# Patient Record
Sex: Female | Born: 1935 | Race: White | Hispanic: No | Smoking: Current every day smoker
Health system: Southern US, Community
[De-identification: ages and names within clinical notes are randomized; demographics above are authoritative.]

## PROBLEM LIST (undated history)

## (undated) DIAGNOSIS — F0280 Dementia in other diseases classified elsewhere without behavioral disturbance: Secondary | ICD-10-CM

## (undated) DIAGNOSIS — N189 Chronic kidney disease, unspecified: Secondary | ICD-10-CM

## (undated) DIAGNOSIS — E785 Hyperlipidemia, unspecified: Secondary | ICD-10-CM

## (undated) DIAGNOSIS — I10 Essential (primary) hypertension: Secondary | ICD-10-CM

## (undated) DIAGNOSIS — G309 Alzheimer's disease, unspecified: Secondary | ICD-10-CM

## (undated) DIAGNOSIS — E119 Type 2 diabetes mellitus without complications: Secondary | ICD-10-CM

## (undated) HISTORY — DX: Essential (primary) hypertension: I10

## (undated) HISTORY — DX: Hyperlipidemia, unspecified: E78.5

## (undated) HISTORY — DX: Dementia in other diseases classified elsewhere, unspecified severity, without behavioral disturbance, psychotic disturbance, mood disturbance, and anxiety: F02.80

## (undated) HISTORY — DX: Type 2 diabetes mellitus without complications: E11.9

## (undated) HISTORY — DX: Alzheimer's disease, unspecified: G30.9

## (undated) HISTORY — DX: Chronic kidney disease, unspecified: N18.9

---

## 2013-05-31 ENCOUNTER — Inpatient Hospital Stay: Payer: Self-pay | Admitting: Internal Medicine

## 2013-05-31 LAB — URINALYSIS, COMPLETE
Bilirubin,UR: NEGATIVE
Blood: NEGATIVE
Glucose,UR: NEGATIVE mg/dL (ref 0–75)
Hyaline Cast: 2
Ph: 5 (ref 4.5–8.0)
Protein: NEGATIVE
RBC,UR: 3 /HPF (ref 0–5)

## 2013-05-31 LAB — CBC
HCT: 41.5 % (ref 35.0–47.0)
HGB: 14.3 g/dL (ref 12.0–16.0)
MCH: 32.3 pg (ref 26.0–34.0)
MCHC: 34.5 g/dL (ref 32.0–36.0)
MCV: 94 fL (ref 80–100)
Platelet: 269 10*3/uL (ref 150–440)
WBC: 9.3 10*3/uL (ref 3.6–11.0)

## 2013-05-31 LAB — COMPREHENSIVE METABOLIC PANEL
Albumin: 4.1 g/dL (ref 3.4–5.0)
Alkaline Phosphatase: 128 U/L (ref 50–136)
BUN: 19 mg/dL — ABNORMAL HIGH (ref 7–18)
Chloride: 101 mmol/L (ref 98–107)
Co2: 26 mmol/L (ref 21–32)
Creatinine: 1.2 mg/dL (ref 0.60–1.30)
EGFR (African American): 49 — ABNORMAL LOW
EGFR (Non-African Amer.): 42 — ABNORMAL LOW
Glucose: 117 mg/dL — ABNORMAL HIGH (ref 65–99)
Osmolality: 272 (ref 275–301)
Potassium: 4 mmol/L (ref 3.5–5.1)
SGOT(AST): 16 U/L (ref 15–37)
SGPT (ALT): 16 U/L (ref 12–78)

## 2013-05-31 LAB — DRUG SCREEN, URINE
Amphetamines, Ur Screen: NEGATIVE (ref ?–1000)
Barbiturates, Ur Screen: NEGATIVE (ref ?–200)
MDMA (Ecstasy)Ur Screen: NEGATIVE (ref ?–500)
Phencyclidine (PCP) Ur S: NEGATIVE (ref ?–25)

## 2013-05-31 LAB — ETHANOL: Ethanol %: <0.003 % (ref 0.000–0.080)

## 2013-06-01 LAB — TSH: Thyroid Stimulating Horm: 2.36 u[IU]/mL

## 2013-06-01 LAB — CBC WITH DIFFERENTIAL/PLATELET
Basophil #: 0.1 10*3/uL (ref 0.0–0.1)
Eosinophil %: 3 %
Lymphocyte %: 28.2 %
MCHC: 34 g/dL (ref 32.0–36.0)
MCV: 94 fL (ref 80–100)
Neutrophil #: 5.6 10*3/uL (ref 1.4–6.5)
Neutrophil %: 59.9 %
Platelet: 255 10*3/uL (ref 150–440)
RBC: 4.59 10*6/uL (ref 3.80–5.20)
RDW: 12.5 % (ref 11.5–14.5)
WBC: 9.4 10*3/uL (ref 3.6–11.0)

## 2013-06-01 LAB — BASIC METABOLIC PANEL
BUN: 19 mg/dL — ABNORMAL HIGH (ref 7–18)
Calcium, Total: 9.1 mg/dL (ref 8.5–10.1)
Chloride: 101 mmol/L (ref 98–107)
Co2: 27 mmol/L (ref 21–32)
Glucose: 126 mg/dL — ABNORMAL HIGH (ref 65–99)
Osmolality: 274 (ref 275–301)
Sodium: 135 mmol/L — ABNORMAL LOW (ref 136–145)

## 2013-06-01 LAB — LIPID PANEL
Ldl Cholesterol, Calc: 128 mg/dL — ABNORMAL HIGH (ref 0–100)
Triglycerides: 167 mg/dL (ref 0–200)
VLDL Cholesterol, Calc: 33 mg/dL (ref 5–40)

## 2013-06-01 LAB — MAGNESIUM: Magnesium: 2.1 mg/dL

## 2013-06-02 LAB — URINE CULTURE

## 2014-02-02 ENCOUNTER — Emergency Department: Payer: Self-pay | Admitting: Emergency Medicine

## 2014-02-02 LAB — BASIC METABOLIC PANEL
ANION GAP: 7 (ref 7–16)
BUN: 31 mg/dL — ABNORMAL HIGH (ref 7–18)
CALCIUM: 9.1 mg/dL (ref 8.5–10.1)
CHLORIDE: 101 mmol/L (ref 98–107)
CREATININE: 1.45 mg/dL — AB (ref 0.60–1.30)
Co2: 25 mmol/L (ref 21–32)
GFR CALC AF AMER: 39 — AB
GFR CALC NON AF AMER: 33 — AB
Glucose: 225 mg/dL — ABNORMAL HIGH (ref 65–99)
OSMOLALITY: 280 (ref 275–301)
Potassium: 4.9 mmol/L (ref 3.5–5.1)
Sodium: 133 mmol/L — ABNORMAL LOW (ref 136–145)

## 2014-02-02 LAB — CBC
HCT: 37.7 % (ref 35.0–47.0)
HGB: 12.8 g/dL (ref 12.0–16.0)
MCH: 32.6 pg (ref 26.0–34.0)
MCHC: 33.9 g/dL (ref 32.0–36.0)
MCV: 96 fL (ref 80–100)
Platelet: 242 10*3/uL (ref 150–440)
RBC: 3.93 10*6/uL (ref 3.80–5.20)
RDW: 12.5 % (ref 11.5–14.5)
WBC: 10.7 10*3/uL (ref 3.6–11.0)

## 2015-01-18 NOTE — H&P (Signed)
PATIENT NAME:  Dana Riddle, Dana Riddle MR#:  [id number] DATE OF BIRTH:  [birth year]  DATE OF ADMISSION:  05/31/2013   PRIMARY CARE PHYSICIAN: Dr. Vonita MossMark Crissman.   REFERRING EMERGENCY ROOM PHYSICIAN: Dr. Jene Everyobert Kinner.   CHIEF COMPLAINT: Confusion.   HISTORY OF PRESENT ILLNESS: An 81 year old female who lives alone and very independent and active and healthy according to her age. She has her front upper tooth broken for the last 3 to 4 months and has been following with dentist. Today, she was trying to go to her dentist office, but instead of going to dentist office she ended up going to [facility] 6 times repeatedly. In spite of being told, "No, this is a laboratory," she repeatedly went over there to see her dentist, so finally police were called in and police brought her to Emergency Room because of confusion. Emergency physician put her on involuntary commitment because of her inability to understand the place. On my examination and history taking, the patient looks totally alert and surprisingly she looks very sharp about all the histories and answers she gave to me. She denies any complaint of urinary frequency or urgency, and she is surprised that she went 6 times to laboratory she does not know about that, but she is worried about her dog who is alone at home and somebody needs to feed it.  Denies any complaint of fever, cough or shortness of breath.   REVIEW OF SYSTEMS:  CONSTITUTIONAL: Negative for fever, fatigue, weakness, pain or weight loss.  EYES: No blurring, double vision, redness.  EARS, NOSE, THROAT: No tinnitus, ear pain, hearing loss or discharge.    RESPIRATORY: No cough, wheezing, hemoptysis or asthma.  CARDIOVASCULAR: No chest pain, orthopnea, edema, arrhythmia or palpitations.  GASTROINTESTINAL: No nausea, vomiting, diarrhea, abdominal pain.  GENITOURINARY: No dysuria, hematuria or increased frequency.  ENDOCRINE: No increased sweating. No heat or cold intolerance.  SKIN: No acne or  rashes.  MUSCULOSKELETAL: No pain or swelling in the joints.  NEUROLOGICAL: No numbness, weakness or epilepsy or tremors.  PSYCHIATRIC: No anxiety, insomnia or bipolar disorder.   PAST MEDICAL HISTORY: Diabetes.   PAST SURGICAL HISTORY: None.   ALLERGIES: No known drug allergy.   SOCIAL HISTORY: She lives alone, independent, has a dog. Smokes 3 to 4 cigarettes a day. Denies drinking alcohol or doing any drugs.   FAMILY HISTORY: Denies any coronary artery disease or cancer in the family.   HOME MEDICATION:  Glipizide for diabetes. She does not remember the dose.   PHYSICAL EXAMINATION: VITAL SIGNS: In ER, temperature 98.5, pulse 98, respirations 18, blood pressure 140/74, pulse ox 97 on room air.  GENERAL: The patient is alert and appears to be oriented but slightly confused and forgetful about the events, what happened today and why she came to the Emergency Room. She is cooperative with history taking and physical examination.  HEENT: Head and neck atraumatic. Conjunctivae pink. Oral mucosa moist.  NECK: Supple. No JVD.  RESPIRATORY: Bilateral clear and equal air entry.  CARDIOVASCULAR: S1, S2 present, regular. No murmur.  ABDOMEN: Soft, nontender. Bowel sounds present. No organomegaly.  SKIN: No rashes.  EXTREMITIES:  Legs: No edema. Joints: No swelling or tenderness.  NEUROLOGICAL: Power 5/5. Follows commands. No tremors.  PSYCHIATRIC: Does not appear in any acute psychiatric illness at this time.   LABORATORY RESULTS:  Glucose 117, BUN 19, creatinine 1.20, sodium 134, potassium 4.0, chloride 101, CO2 of 26.  Ethanol level less than 3. Total protein 8, albumin 4.1, bilirubin  0.5. TSH 1.42. Urine for toxicology is negative. Total white cell count 9.3, hemoglobin 14.3, platelet count 269 and MCV 94. Urinalysis is positive with 72 WBCs and 3+ leukocyte esterase.   ASSESSMENT AND PLAN: An 81 year old female with past medical history of diabetes, presented to the ER by police because of  confusion and repeatedly going to laboratory  instead of going to dentist office. She is currently on involuntary commitment due to her confusion.  1.  Altered mental status and confusion, possibly delirium secondary to urinary tract infection. We will treat underlying cause. She is currently on involuntary commitment. We will call psychiatry consult for further management of that.  2.  Urinary tract infection. We will send urine culture and blood culture, and we will treat with IV Rocephin.  3.  Diabetes. We will give her insulin with sliding scale coverage and monitor her blood sugar level.  4.  Current smoker. Smoking cessation counseling done for 5 minutes. She declines to use nicotine patch here in the hospital.   CODE STATUS: Full code.   TOTAL TIME SPENT ON THIS ADMISSION: 45 minutes    ____________________________ Hope Pigeon Elisabeth Pigeon, MD vgv:dmm D: 05/31/2013 18:53:31 ET T: 05/31/2013 19:05:48 ET JOB#: [id number]  cc: Hope Pigeon. Elisabeth Pigeon, MD, <Dictator> Altamese Dilling MD ELECTRONICALLY SIGNED 06/03/2013 15:41

## 2015-01-18 NOTE — Consult Note (Signed)
PATIENT NAME:  Dana Riddle, Jaidence E MR#:  [id number] DATE OF BIRTH:  1928/11/02  DATE OF CONSULTATION:  06/02/2013  REFERRING PHYSICIAN:      Ramonita LabAruna Gouru, M.D.   CONSULTING PHYSICIAN:  Farid Grigorian B. Maikel Neisler, MD  REASON FOR CONSULTATION: To evaluate a patient with altered mental status.   IDENTIFYING DATA:  Dana Riddle  is an 81 year old female with no past psychiatric history.   CHIEF COMPLAINT: "Thank you, thank you."   HISTORY OF PRESENT ILLNESS:  Dana Riddle has no psychiatric history. She was brought to the hospital for altered mental status in the course of urinary tract infection. Reportedly, on the day of admission, instead of going to her dentist, she repeatedly went to [facility]. Police were eventually called, the patient was brought to the Emergency Room and she was petitioned. She was admitted to the medical floor and treated for urinary tract infection with full resolution of mental status changes. The patient is a very pleasant, polite and thrilled to talk with psychiatrist and be released from the hospital. She adamantly denies any symptoms of depression, anxiety, psychosis. No symptoms suggestive of bipolar mania.  No substance use. She is living independently and fully able to manage her affairs.    PAST PSYCHIATRIC HISTORY: None. No hospitalizations or treatment. No substance abuse treatment.   FAMILY PSYCHIATRIC HISTORY: None.   MEDICAL HISTORY:  Diabetes, a UTI.    ALLERGIES: No known drug allergies.   MEDICATIONS ON ADMISSION: Glipizide.   SOCIAL HISTORY: As above, lives independently, very well adjusted. For the past several months, she has been following her with her dentist to fix her broken upper tooth,  indicating no mood problems.    REVIEW OF SYSTEMS:   CONSTITUTIONAL: No fever no fevers or chills. No weight changes.  EYES: No double or blurred vision.  ENT: No hearing loss.  RESPIRATORY: No shortness of breath or cough.  CARDIOVASCULAR: No chest pain or orthopnea.   GASTROINTESTINAL: No abdominal pain, nausea, vomiting or diarrhea.  GENITOURINARY: No incontinence or frequency, but recent urinary tract infection.  ENDOCRINE: No heat or cold intolerance.  LYMPHATIC: No anemia or easy bruising.  INTEGUMENTARY: No acne or rash.  MUSCULOSKELETAL: No muscle or joint pain.  NEUROLOGIC: No tingling or weakness.  PSYCHIATRIC: See history of present illness for details.   PHYSICAL EXAMINATION: VITAL SIGNS: Blood pressure 153/78, pulse 64, respirations 18, temperature 97.9.  GENERAL: This is a slender female, looking younger than status and stated age, in no acute distress.   The rest of the physical examination is deferred to her primary attending.   LABORATORY DATA: Chemistries: Blood glucose was 126,  BUN 19, creatinine 1.03, sodium 135, potassium 4, hemoglobin A1c 8.3. Blood alcohol level on admission 0. LFTs within normal limits. TSH 1.42.  Urine tox screen is negative for substances. CBC within normal limits. A urinalysis suggestive of urinary tract infection with 3+ leukocyte esterase and 72 white cells per field. A urine culture makes bacteria.   EKG: Normal sinus rhythm, septal infarct, abnormal EKG.   MENTAL STATUS EXAMINATION: The patient is alert and oriented to person, place, time and situation. She is pleasant, polite and cooperative. She is in bed, wearing hospital gown. She maintains good eye contact. Her speech is of normal rhythm, rate and volume. Mood is fine with full affect. Thought process is logical and goal oriented.   THOUGHT CONTENT: She denies suicidal or homicidal ideation. There are no delusions paranoia or hallucinations. Her cognition is grossly intact. Her insight  and judgment are good.    SUICIDE RISK ASSESSMENT: This is a patient who was admitted to the hospital with altered mental status resulting from urinary tract infection. This is treated. The patient; all the symptoms have resolved.   DIAGNOSIS: AXIS I: Cognitive  disorder not otherwise specified, urinary tract infection, delirium.   AXIS II: Deferred.   AXIS III: Diabetes.   AXIS IV: Physical illness.   AXIS V: Global Assessment of Functioning, score 50.   PLAN:  1.  The patient no longer meets criteria for involuntary inpatient psychiatric commitment. I will terminate proceedings. Please discharge as appropriate.  2.  No medications recommended.  3.  I will sign off.   ____________________________ Ellin Goodie. Jennet Maduro, MD jbp:nts D: 06/02/2013 22:48:26 ET T: 06/02/2013 23:21:54 ET JOB#: [id number]  cc: Tanita Palinkas B. Jennet Maduro, MD, <Dictator> Shari Prows MD ELECTRONICALLY SIGNED 06/06/2013 6:27

## 2015-01-18 NOTE — Discharge Summary (Signed)
PATIENT NAME:  Dana Riddle, Torin E MR#:  [id number] DATE OF BIRTH:  1929-01-13  DATE OF ADMISSION:  05/31/2013 DATE OF DISCHARGE:  06/02/2013  PRESENTING COMPLAINT: Confusion.   DISCHARGE DIAGNOSES:  1. Acute encephalopathy/altered mental status secondary suspected due to urinary tract infection, improved.  2. Type 2 diabetes.   CODE STATUS: Full code.   MEDICATIONS:  1. Glipizide 1 tablet b.i.d.  2. Keflex 500 mg b.i.d.   DISCHARGE DIET: Carbohydrate-controlled diet.   FOLLOWUP: With Dr. Dossie Arbourrissman in 1 to 2 weeks.   CONSULTATIONS: Jolanta B. Pucilowska, MD   BRIEF SUMMARY OF HOSPITAL COURSE: [name] is a very pleasant 81 year old Caucasian female who has history of diabetes, who was brought to the Emergency Room after she was found to have altered mental status. The patient was admitted with:   1. Acute encephalopathy/altered mental status. The patient, on the day of admission, instead of going to the dentist for getting her teeth fixed, she reportedly went to [facility]. Police were eventually called, and the patient was brought to the Emergency Room. IVC papers were taken out, and the patient was found to have UTI. She was started on IV fluids, Rocephin, and the patient's symptoms improved over 36 hours. She was back to her baseline, oriented x3, and requesting to go home. She did not have any symptoms suggestive of depression, anxiety or psychosis. She was seen by Dr. Jennet MaduroPucilowska, who lifted off the IVC commitment papers. The patient was then discharged to home.  2. Urinary tract infection. She will complete a course with Keflex.  3. Type 2 diabetes. Continue glipizide.  4. Hospital stay otherwise remained stable.   CODE STATUS: The patient remained a full code.   TIME SPENT: 40 minutes.   ____________________________ Wylie HailSona A. Allena KatzPatel, MD sap:OSi D: 06/03/2013 06:57:28 ET T: 06/03/2013 07:40:03 ET JOB#: [id number]  cc: Charley Lafrance A. Allena KatzPatel, MD, <Dictator> Steele SizerMark A. Crissman, MD Willow OraSONA A Shaneeka Scarboro  MD ELECTRONICALLY SIGNED 06/07/2013 12:13

## 2015-03-12 ENCOUNTER — Ambulatory Visit (INDEPENDENT_AMBULATORY_CARE_PROVIDER_SITE_OTHER): Payer: Medicare Other | Admitting: Family Medicine

## 2015-03-12 ENCOUNTER — Encounter: Payer: Self-pay | Admitting: Family Medicine

## 2015-03-12 VITALS — BP 113/69 | HR 76 | Temp 97.6°F | Ht 62.0 in | Wt 104.0 lb

## 2015-03-12 DIAGNOSIS — R410 Disorientation, unspecified: Secondary | ICD-10-CM | POA: Diagnosis not present

## 2015-03-12 DIAGNOSIS — N3000 Acute cystitis without hematuria: Secondary | ICD-10-CM

## 2015-03-12 DIAGNOSIS — I10 Essential (primary) hypertension: Secondary | ICD-10-CM

## 2015-03-12 DIAGNOSIS — E119 Type 2 diabetes mellitus without complications: Secondary | ICD-10-CM | POA: Diagnosis not present

## 2015-03-12 DIAGNOSIS — N390 Urinary tract infection, site not specified: Secondary | ICD-10-CM | POA: Insufficient documentation

## 2015-03-12 LAB — URINALYSIS, ROUTINE W REFLEX MICROSCOPIC
Bilirubin, UA: NEGATIVE
Ketones, UA: NEGATIVE
Nitrite, UA: NEGATIVE
PH UA: 5.5 (ref 5.0–7.5)
PROTEIN UA: NEGATIVE
Specific Gravity, UA: 1.01 (ref 1.005–1.030)
UUROB: 0.2 mg/dL (ref 0.2–1.0)

## 2015-03-12 LAB — MICROSCOPIC EXAMINATION: WBC, UA: >30 /hpf — AB (ref 0–?)

## 2015-03-12 LAB — BAYER DCA HB A1C WAIVED: HB A1C (BAYER DCA - WAIVED): 8.5 % — ABNORMAL HIGH (ref <7.0)

## 2015-03-12 MED ORDER — CIPROFLOXACIN HCL 250 MG PO TABS
250.0000 mg | ORAL_TABLET | Freq: Two times a day (BID) | ORAL | Status: DC
Start: 2015-03-12 — End: 2015-04-02

## 2015-03-12 NOTE — Progress Notes (Signed)
BP 113/69 mmHg  Pulse 76  Temp(Src) 97.6 F (36.4 C)  Ht _0  (1.575 m)  Wt 104 lb (47.174 kg)  BMI 19.02 kg/m2  SpO2 95%  LMP  (LMP Unknown)   Subjective:    Patient ID: Dana Riddle, female    DOB: 1929/03/24, 81 y.o.   MRN: [id number]  HPI: Dana Riddle is a 81 y.o. female  Chief Complaint  Patient presents with  . Urinary Tract Infection  . Anxiety    Relevant past medical, surgical, family and social history reviewed and updated as indicated. Interim medical history since our last visit reviewed. Allergies and medications reviewed and updated. Concerned may have UTI as increased agitation. DM home checks caregiver is OK No med issues or side effects Some limited fluid intake  Review of Systems  Constitutional: Negative for fever, activity change and fatigue.  Respiratory: Negative.   Cardiovascular: Negative.   Genitourinary: Negative for dysuria.  Psychiatric/Behavioral: Positive for behavioral problems, confusion and agitation.    Per HPI unless specifically indicated above     Objective:    BP 113/69 mmHg  Pulse 76  Temp(Src) 97.6 F (36.4 C)  Ht _1  (1.575 m)  Wt 104 lb (47.174 kg)  BMI 19.02 kg/m2  SpO2 95%  LMP  (LMP Unknown)  Wt Readings from Last 3 Encounters:  03/12/15 104 lb (47.174 kg)  11/05/14 106 lb (48.081 kg)    Physical Exam  Constitutional: She is oriented to person, place, and time. She appears well-developed and well-nourished. No distress.  HENT:  Head: Normocephalic and atraumatic.  Right Ear: Hearing normal.  Left Ear: Hearing normal.  Nose: Nose normal.  Eyes: Conjunctivae and lids are normal. Right eye exhibits no discharge. Left eye exhibits no discharge. No scleral icterus.  Cardiovascular: Normal heart sounds.   Pulmonary/Chest: Effort normal and breath sounds normal. No respiratory distress.  Abdominal: Soft. Bowel sounds are normal. There is no tenderness. There is no guarding.  Musculoskeletal: Normal  range of motion.  Neurological: She is alert and oriented to person, place, and time.  Skin: Skin is intact. No rash noted.  Psychiatric: She has a normal mood and affect. Her speech is normal and behavior is normal. Judgment and thought content normal. Cognition and memory are normal.    Results for orders placed or performed in visit on 14/43/15  Basic metabolic panel  Result Value Ref Range   Glucose 225 (H) 65-99 mg/dL   BUN 31 (H) 7-18 mg/dL   Creatinine 1.45 (H) 0.60-1.30 mg/dL   Sodium 133 (L) 136-145 mmol/L   Potassium 4.9 3.5-5.1 mmol/L   Chloride 101 98-107 mmol/L   Co2 25 21-32 mmol/L   Calcium, Total 9.1 8.5-10.1 mg/dL   Osmolality 280 275-301   Anion Gap 7 7-16   EGFR (African American) 39 (L)    EGFR (Non-African Amer.) 33 (L)   CBC  Result Value Ref Range   WBC 10.7 3.6-11.0 x10 3/mm 3   RBC 3.93 3.80-5.20 X10 6/mm 3   HGB 12.8 12.0-16.0 g/dL   HCT 37.7 35.0-47.0 %   MCV 96 80-100 fL   MCH 32.6 26.0-34.0 pg   MCHC 33.9 32.0-36.0 g/dL   RDW 12.5 11.5-14.5 %   Platelet 242 150-440 x10 3/mm 3      Assessment & Plan:   Problem List Items Addressed This Visit      Cardiovascular and Mediastinum   Hypertension (Chronic)    The current medical regimen is effective;  continue present plan and medications.       Relevant Orders   Basic metabolic panel     Endocrine   Diabetes mellitus without complication (Chronic)    The current medical regimen is effective;  continue present plan and medications.       Relevant Orders   Basic metabolic panel   Hemoglobin A1c     Genitourinary   Urinary tract infection    Discussed with caregiver care and fluids meds      Relevant Medications   ciprofloxacin (CIPRO) 250 MG tablet    Other Visit Diagnoses    Confusion    -  Primary    Relevant Orders    Urinalysis, Routine w reflex microscopic        Follow up plan: No Follow-up on file.

## 2015-03-12 NOTE — Assessment & Plan Note (Signed)
The current medical regimen is effective;  continue present plan and medications.  

## 2015-03-12 NOTE — Assessment & Plan Note (Signed)
Discussed with caregiver care and fluids meds

## 2015-03-13 LAB — BASIC METABOLIC PANEL
BUN/Creatinine Ratio: 16 (ref 11–26)
BUN: 16 mg/dL (ref 8–27)
CALCIUM: 9.5 mg/dL (ref 8.7–10.3)
CO2: 24 mmol/L (ref 18–29)
CREATININE: 1 mg/dL (ref 0.57–1.00)
Chloride: 97 mmol/L (ref 97–108)
GFR, EST AFRICAN AMERICAN: 60 mL/min/{1.73_m2} (ref >59)
GFR, EST NON AFRICAN AMERICAN: 52 mL/min/{1.73_m2} — AB (ref >59)
Glucose: 218 mg/dL — ABNORMAL HIGH (ref 65–99)
POTASSIUM: 5.1 mmol/L (ref 3.5–5.2)
Sodium: 140 mmol/L (ref 134–144)

## 2015-03-21 ENCOUNTER — Emergency Department
Admission: EM | Admit: 2015-03-21 | Discharge: 2015-03-21 | Disposition: A | Discharge status: Home / Self Care | Payer: Medicare Other | Attending: Student | Admitting: Student

## 2015-03-21 ENCOUNTER — Encounter: Payer: Self-pay | Admitting: *Deleted

## 2015-03-21 DIAGNOSIS — L0211 Cutaneous abscess of neck: Secondary | ICD-10-CM | POA: Insufficient documentation

## 2015-03-21 DIAGNOSIS — E119 Type 2 diabetes mellitus without complications: Secondary | ICD-10-CM | POA: Insufficient documentation

## 2015-03-21 DIAGNOSIS — N189 Chronic kidney disease, unspecified: Secondary | ICD-10-CM | POA: Diagnosis not present

## 2015-03-21 DIAGNOSIS — Z79899 Other long term (current) drug therapy: Secondary | ICD-10-CM | POA: Diagnosis not present

## 2015-03-21 DIAGNOSIS — I129 Hypertensive chronic kidney disease with stage 1 through stage 4 chronic kidney disease, or unspecified chronic kidney disease: Secondary | ICD-10-CM | POA: Insufficient documentation

## 2015-03-21 DIAGNOSIS — G309 Alzheimer's disease, unspecified: Secondary | ICD-10-CM | POA: Diagnosis not present

## 2015-03-21 DIAGNOSIS — F0280 Dementia in other diseases classified elsewhere without behavioral disturbance: Secondary | ICD-10-CM | POA: Diagnosis not present

## 2015-03-21 DIAGNOSIS — Z7982 Long term (current) use of aspirin: Secondary | ICD-10-CM | POA: Insufficient documentation

## 2015-03-21 MED ORDER — SULFAMETHOXAZOLE-TRIMETHOPRIM 800-160 MG PO TABS: 1.0000 | ORAL_TABLET | Freq: Two times a day (BID) | ORAL | Status: DC

## 2015-03-21 MED ORDER — LIDOCAINE-EPINEPHRINE (PF) 1 %-1:200000 IJ SOLN
INTRAMUSCULAR | Status: AC
  Filled 2015-03-21: qty 30

## 2015-03-21 MED ORDER — LIDOCAINE HCL (PF) 1 % IJ SOLN
INTRAMUSCULAR | Status: AC
  Filled 2015-03-21: qty 5

## 2015-03-21 NOTE — ED Provider Notes (Signed)
[facility] Emergency Department Provider Note  ____________________________________________  Time seen: Approximately 4:24 PM  I have reviewed the triage vital signs and the nursing notes.   HISTORY  Chief Complaint Recurrent Skin Infections   HPI Dana Riddle is a 81 y.o. female presents to the emergency department for a abscess on the right posterior neck.Caregiver denies history of boils or skin infections over the past year.   Past Medical History  Diagnosis Date  . Diabetes mellitus without complication   . Hyperlipidemia   . Hypertension   . Chronic kidney disease   . Alzheimer's dementia     Patient Active Problem List   Diagnosis Date Noted  . Hypertension 03/12/2015  . Diabetes mellitus without complication 03/12/2015  . Urinary tract infection 03/12/2015    History reviewed. No pertinent past surgical history.  Current Outpatient Rx  Name  Route  Sig  Dispense  Refill  . ACCU-CHEK AVIVA PLUS test strip      TEST BLOOD SUGAR QD      0     Dispense as written.   Marland Kitchen ACCU-CHEK FASTCLIX LANCETS MISC      TEST BLOOD SUGAR QD      0   . aspirin EC 81 MG tablet   Oral   Take 81 mg by mouth daily.         . ciprofloxacin (CIPRO) 250 MG tablet   Oral   Take 1 tablet (250 mg total) by mouth 2 (two) times daily.   6 tablet   0   . clonazePAM (KLONOPIN) 0.5 MG tablet               . empagliflozin (JARDIANCE) 10 MG TABS tablet   Oral   Take 10 mg by mouth daily.         . insulin detemir (LEVEMIR) 100 UNIT/ML injection   Subcutaneous   Inject 20 Units into the skin daily.         . mirtazapine (REMERON) 15 MG tablet   Oral   Take 7.5 mg by mouth at bedtime.         Marland Kitchen NOVOFINE AUTOCOVER 30G X 8 MM MISC                 Dispense as written.   . sitaGLIPtin (JANUVIA) 50 MG tablet   Oral   Take 50 mg by mouth daily.         Marland Kitchen sulfamethoxazole-trimethoprim (BACTRIM DS,SEPTRA DS) 800-160 MG per  tablet   Oral   Take 1 tablet by mouth 2 (two) times daily.   20 tablet   0     Allergies Review of patient's allergies indicates no known allergies.  Family History  Problem Relation Age of Onset  . Cancer Father     lung    Social History History  Substance Use Topics  . Smoking status: Current Every Day Smoker  . Smokeless tobacco: Never Used  . Alcohol Use: No    Review of Systems   Constitutional: No fever/chills Eyes: No visual changes. ENT: No complaints Cardiovascular: Denies chest pain. Respiratory: Denies shortness of breath. Gastrointestinal: No abdominal pain.  No nausea, no vomiting.  No diarrhea.  No constipation. Genitourinary: Negative for dysuria. Musculoskeletal: Negative for back pain. Skin: Boil on the posterior neck Neurological: Negative for headaches, focal weakness or numbness.  10-point ROS otherwise negative.  ____________________________________________   PHYSICAL EXAM:  VITAL SIGNS: ED Triage Vitals  Enc Vitals Group  BP 03/21/15 1534 122/64 mmHg     Pulse Rate 03/21/15 1534 96     Resp 03/21/15 1534 20     Temp 03/21/15 1534 98.3 F (36.8 C)     Temp Source 03/21/15 1534 Oral     SpO2 03/21/15 1534 95 %     Weight 03/21/15 1534 104 lb (47.174 kg)     Height 03/21/15 1534  (1.651 m)     Head Cir --      Peak Flow --      Pain Score 03/21/15 1535 0     Pain Loc --      Pain Edu? --      Excl. in GC? --     Constitutional: Alert and oriented. Well appearing and in no acute distress. Eyes: Conjunctivae are normal. PERRL. EOMI. Head: Atraumatic. Nose: No congestion/rhinnorhea. Mouth/Throat: Mucous membranes are moist.  Oropharynx non-erythematous. No oral lesions. Neck: No stridor. Cardiovascular: Normal rate, regular rhythm.  Good peripheral circulation. Respiratory: Normal respiratory effort.  No retractions. Lungs CTAB. Gastrointestinal: Soft and nontender. No distention. No abdominal bruits.   Musculoskeletal: No lower extremity tenderness nor edema.  No joint effusions. Neurologic:  Normal speech and language. No gross focal neurologic deficits are appreciated. Speech is normal. No gait instability. Skin:  Rash no; Erythema yes--indurated area posterior neck with mild fluctuance; Negative for petechiae.  Psychiatric: Mood and affect are normal. Speech and behavior are normal.  ____________________________________________   LABS (all labs ordered are listed, but only abnormal results are displayed)  Labs Reviewed - No data to display ____________________________________________  EKG   ____________________________________________  RADIOLOGY   ____________________________________________   PROCEDURES  Procedure(s) performed: INCISION AND DRAINAGE Performed by: Kem Boroughs Consent: Verbal consent obtained. Risks and benefits: risks, benefits and alternatives were discussed Type: abscess  Body area:posterior neck  Anesthesia: local infiltration  Incision was made with a scalpel.  Local anesthetic: lidocaine 1% with epinephrine  Anesthetic total: 3 ml  Complexity: complex  Blunt dissection to break up loculations  Drainage: purulent  Drainage amount: small  Packing material: 1/4 in iodoform gauze  Patient tolerance: Patient tolerated the procedure well with no immediate complications.    ____________________________________________   INITIAL IMPRESSION / ASSESSMENT AND PLAN / ED COURSE  Pertinent labs & imaging results that were available during my care of the patient were reviewed by me and considered in my medical decision making (see chart for details).  Caregiver was advised to remove the packing in 2 days. She was advised to follow up with the primary care provider or return to the ER for symptoms that are not improving or change or worsen. ____________________________________________   FINAL CLINICAL IMPRESSION(S) / ED  DIAGNOSES  Final diagnoses:  Abscess of neck       Chinita Pester, FNP 03/21/15 1655  Gayla Doss, MD 03/22/15 0002

## 2015-03-21 NOTE — ED Notes (Signed)
Pt arrives with complaints of a boil to the back of neck, redness and purulent drainage at site

## 2015-03-21 NOTE — ED Notes (Signed)
Pt alert and oriented X4, active, cooperative, pt in NAD. RR even and unlabored, color WNL.  Pt informed to return if any life threatening symptoms occur.  Left with caregiver.  

## 2015-03-21 NOTE — Discharge Instructions (Signed)

## 2015-03-21 NOTE — ED Notes (Signed)
Pt here with her caregiver (she lives in [place] and [place] [employer] is her guardian). Pt has boil on the back of her neck which was discovered yesterday, no fever or chills with this, not draining at this time

## 2015-03-27 ENCOUNTER — Encounter: Payer: Self-pay | Admitting: Radiology

## 2015-03-27 ENCOUNTER — Emergency Department: Payer: Medicare Other

## 2015-03-27 DIAGNOSIS — G309 Alzheimer's disease, unspecified: Secondary | ICD-10-CM | POA: Insufficient documentation

## 2015-03-27 DIAGNOSIS — Y998 Other external cause status: Secondary | ICD-10-CM | POA: Insufficient documentation

## 2015-03-27 DIAGNOSIS — W050XXA Fall from non-moving wheelchair, initial encounter: Secondary | ICD-10-CM | POA: Diagnosis not present

## 2015-03-27 DIAGNOSIS — I129 Hypertensive chronic kidney disease with stage 1 through stage 4 chronic kidney disease, or unspecified chronic kidney disease: Secondary | ICD-10-CM | POA: Insufficient documentation

## 2015-03-27 DIAGNOSIS — N189 Chronic kidney disease, unspecified: Secondary | ICD-10-CM | POA: Insufficient documentation

## 2015-03-27 DIAGNOSIS — Y9289 Other specified places as the place of occurrence of the external cause: Secondary | ICD-10-CM | POA: Insufficient documentation

## 2015-03-27 DIAGNOSIS — E119 Type 2 diabetes mellitus without complications: Secondary | ICD-10-CM | POA: Diagnosis not present

## 2015-03-27 DIAGNOSIS — F0280 Dementia in other diseases classified elsewhere without behavioral disturbance: Secondary | ICD-10-CM | POA: Insufficient documentation

## 2015-03-27 DIAGNOSIS — Z720 Tobacco use: Secondary | ICD-10-CM | POA: Insufficient documentation

## 2015-03-27 DIAGNOSIS — F17210 Nicotine dependence, cigarettes, uncomplicated: Secondary | ICD-10-CM | POA: Insufficient documentation

## 2015-03-27 DIAGNOSIS — Z79899 Other long term (current) drug therapy: Secondary | ICD-10-CM | POA: Insufficient documentation

## 2015-03-27 DIAGNOSIS — Z7982 Long term (current) use of aspirin: Secondary | ICD-10-CM | POA: Insufficient documentation

## 2015-03-27 DIAGNOSIS — Z794 Long term (current) use of insulin: Secondary | ICD-10-CM | POA: Diagnosis not present

## 2015-03-27 DIAGNOSIS — E785 Hyperlipidemia, unspecified: Secondary | ICD-10-CM | POA: Diagnosis not present

## 2015-03-27 DIAGNOSIS — Y9389 Activity, other specified: Secondary | ICD-10-CM | POA: Diagnosis not present

## 2015-03-27 DIAGNOSIS — S01112A Laceration without foreign body of left eyelid and periocular area, initial encounter: Secondary | ICD-10-CM | POA: Insufficient documentation

## 2015-03-27 DIAGNOSIS — Z792 Long term (current) use of antibiotics: Secondary | ICD-10-CM | POA: Insufficient documentation

## 2015-03-27 NOTE — ED Notes (Signed)
Patient ambulatory to triage with steady gait, without difficulty or distress noted; pt accomp by caregiver Dana Riddle([place]) who reports pt went to get out of chair, foot got hung and fell hitting head on floor; lac noted just below left eyebrow; denies LOC or HA; denies any c/o pain

## 2015-03-28 ENCOUNTER — Emergency Department
Admission: EM | Admit: 2015-03-28 | Discharge: 2015-03-28 | Disposition: A | Discharge status: Home / Self Care | Payer: Medicare Other | Attending: Emergency Medicine | Admitting: Emergency Medicine

## 2015-03-28 ENCOUNTER — Encounter: Payer: Self-pay | Admitting: Emergency Medicine

## 2015-03-28 DIAGNOSIS — S0181XA Laceration without foreign body of other part of head, initial encounter: Secondary | ICD-10-CM

## 2015-03-28 NOTE — Discharge Instructions (Signed)
Facial Laceration  A facial laceration is a cut on the face. These injuries can be painful and cause bleeding. Lacerations usually heal quickly, but they need special care to reduce scarring. DIAGNOSIS  Your health care provider will take a medical history, ask for details about how the injury occurred, and examine the wound to determine how deep the cut is. TREATMENT  Some facial lacerations may not require closure. Others may not be able to be closed because of an increased risk of infection. The risk of infection and the chance for successful closure will depend on various factors, including the amount of time since the injury occurred. The wound may be cleaned to help prevent infection. If closure is appropriate, pain medicines may be given if needed. Your health care provider will use stitches (sutures), wound glue (adhesive), or skin adhesive strips to repair the laceration. These tools bring the skin edges together to allow for faster healing and a better cosmetic outcome. If needed, you may also be given a tetanus shot. HOME CARE INSTRUCTIONS  Only take over-the-counter or prescription medicines as directed by your health care provider.  Follow your health care provider's instructions for wound care. These instructions will vary depending on the technique used for closing the wound. For Sutures:  Keep the wound clean and dry.   If you were given a bandage (dressing), you should change it at least once a day. Also change the dressing if it becomes wet or dirty, or as directed by your health care provider.   Wash the wound with soap and water 2 times a day. Rinse the wound off with water to remove all soap. Pat the wound dry with a clean towel.   After cleaning, apply a thin layer of the antibiotic ointment recommended by your health care provider. This will help prevent infection and keep the dressing from sticking.   You may shower as usual after the first 24 hours. Do not soak the  wound in water until the sutures are removed.   Get your sutures removed as directed by your health care provider. With facial lacerations, sutures should usually be taken out after 4-5 days to avoid stitch marks.   Wait a few days after your sutures are removed before applying any makeup. For Skin Adhesive Strips:  Keep the wound clean and dry.   Do not get the skin adhesive strips wet. You may bathe carefully, using caution to keep the wound dry.   If the wound gets wet, pat it dry with a clean towel.   Skin adhesive strips will fall off on their own. You may trim the strips as the wound heals. Do not remove skin adhesive strips that are still stuck to the wound. They will fall off in time.  For Wound Adhesive:  You may briefly wet your wound in the shower or bath. Do not soak or scrub the wound. Do not swim. Avoid periods of heavy sweating until the skin adhesive has fallen off on its own. After showering or bathing, gently pat the wound dry with a clean towel.   Do not apply liquid medicine, cream medicine, ointment medicine, or makeup to your wound while the skin adhesive is in place. This may loosen the film before your wound is healed.   If a dressing is placed over the wound, be careful not to apply tape directly over the skin adhesive. This may cause the adhesive to be pulled off before the wound is healed.   Avoid   prolonged exposure to sunlight or tanning lamps while the skin adhesive is in place.  The skin adhesive will usually remain in place for 5-10 days, then naturally fall off the skin. Do not pick at the adhesive film.  After Healing: Once the wound has healed, cover the wound with sunscreen during the day for 1 full year. This can help minimize scarring. Exposure to ultraviolet light in the first year will darken the scar. It can take 1-2 years for the scar to lose its redness and to heal completely.  SEEK IMMEDIATE MEDICAL CARE IF:  You have redness, pain, or  swelling around the wound.   You see ayellowish-white fluid (pus) coming from the wound.   You have chills or a fever.  MAKE SURE YOU:  Understand these instructions.  Will watch your condition.  Will get help right away if you are not doing well or get worse. Document Released: 10/22/2004 Document Revised: 07/05/2013 Document Reviewed: 04/27/2013 Exit[employer] Patient Information 2015 [employer]. This information is not intended to replace advice given to you by your health care provider. Make sure you discuss any questions you have with your health care provider.  

## 2015-03-28 NOTE — ED Notes (Signed)
Patient present to ED after falling last night around 10:20 pm. Patient states she missed and step and hit her head on the floor. Denies LOC. Denies chest pain, dizziness or other complaints. Patient takes 81 mg of Aspirin a day. Bleeding controlled, laceration noted above the left eyebrow. Patient alert and oriented x 4. Respirations even and unlabored. Caretaker by patient bedside. Call bell within reach.

## 2015-03-28 NOTE — ED Provider Notes (Signed)
[facility] Emergency Department Provider Note  ____________________________________________  Time seen: 1:00AM  I have reviewed the triage vital signs and the nursing notes.   HISTORY  Chief Complaint Laceration      HPI Dana Riddle is a 81 y.o. female presents with history of accidental fall tonight with left periorbital abrasion. Per patient's caregiver patient was attempting to stand from her wheelchair and "got caught" on the wheelchair resulting in her fall. The patient denies any pain at this time patient also denies any loss of consciousness at the time of the event.     Past Medical History  Diagnosis Date  . Diabetes mellitus without complication   . Hyperlipidemia   . Hypertension   . Chronic kidney disease   . Alzheimer's dementia     Patient Active Problem List   Diagnosis Date Noted  . Hypertension 03/12/2015  . Diabetes mellitus without complication 03/12/2015  . Urinary tract infection 03/12/2015    No past surgical history on file.  Current Outpatient Rx  Name  Route  Sig  Dispense  Refill  . ACCU-CHEK AVIVA PLUS test strip      TEST BLOOD SUGAR QD      0     Dispense as written.   Marland Kitchen ACCU-CHEK FASTCLIX LANCETS MISC      TEST BLOOD SUGAR QD      0   . aspirin EC 81 MG tablet   Oral   Take 81 mg by mouth daily.         . ciprofloxacin (CIPRO) 250 MG tablet   Oral   Take 1 tablet (250 mg total) by mouth 2 (two) times daily.   6 tablet   0   . clonazePAM (KLONOPIN) 0.5 MG tablet               . empagliflozin (JARDIANCE) 10 MG TABS tablet   Oral   Take 10 mg by mouth daily.         . insulin detemir (LEVEMIR) 100 UNIT/ML injection   Subcutaneous   Inject 20 Units into the skin daily.         . mirtazapine (REMERON) 15 MG tablet   Oral   Take 7.5 mg by mouth at bedtime.         Marland Kitchen NOVOFINE AUTOCOVER 30G X 8 MM MISC                 Dispense as written.   . sitaGLIPtin (JANUVIA)  50 MG tablet   Oral   Take 50 mg by mouth daily.         Marland Kitchen sulfamethoxazole-trimethoprim (BACTRIM DS,SEPTRA DS) 800-160 MG per tablet   Oral   Take 1 tablet by mouth 2 (two) times daily.   20 tablet   0     Allergies Review of patient's allergies indicates no known allergies.  Family History  Problem Relation Age of Onset  . Cancer Father     lung    Social History History  Substance Use Topics  . Smoking status: Current Every Day Smoker  . Smokeless tobacco: Never Used  . Alcohol Use: No    Review of Systems  Constitutional: Negative for fever. Eyes: Negative for visual changes. ENT: Negative for sore throat. Cardiovascular: Negative for chest pain. Respiratory: Negative for shortness of breath. Gastrointestinal: Negative for abdominal pain, vomiting and diarrhea. Genitourinary: Negative for dysuria. Musculoskeletal: Negative for back pain. Skin: Positive for abrasion left periorbital region Neurological: Negative  for headaches, focal weakness or numbness.   10-point ROS otherwise negative.  ____________________________________________   PHYSICAL EXAM:  VITAL SIGNS: ED Triage Vitals  Enc Vitals Group     BP 03/27/15 2305 115/63 mmHg     Pulse Rate 03/27/15 2305 87     Resp 03/27/15 2305 18     Temp 03/27/15 2305 98 F (36.7 C)     Temp Source 03/27/15 2305 Oral     SpO2 03/27/15 2305 95 %     Weight 03/27/15 2305 104 lb (47.174 kg)     Height 03/27/15 2305 5\' 5"  (1.651 m)     Head Cir --      Peak Flow --      Pain Score --      Pain Loc --      Pain Edu? --      Excl. in GC? --      Constitutional: Alert and oriented. Well appearing and in no distress. Eyes: Conjunctivae are normal. PERRL. Normal extraocular movements. ENT   Head: Normocephalic and atraumatic.   Nose: No congestion/rhinnorhea.   Mouth/Throat: Mucous membranes are moist.   Neck: No stridor. Cardiovascular: Normal rate, regular rhythm. Normal and symmetric  distal pulses are present in all extremities. No murmurs, rubs, or gallops. Respiratory: Normal respiratory effort without tachypnea nor retractions. Breath sounds are clear and equal bilaterally. No wheezes/rales/rhonchi. Gastrointestinal: Soft and nontender. No distention. There is no CVA tenderness. Genitourinary: deferred Musculoskeletal: Nontender with normal range of motion in all extremities. No joint effusions.  No lower extremity tenderness nor edema. Neurologic:  Normal speech and language. No gross focal neurologic deficits are appreciated. Speech is normal.  Skin:  Left periorbital abrasion bleeding controlled Psychiatric: Mood and affect are normal. Speech and behavior are normal. Patient exhibits appropriate insight and judgment.  ____________________________________________   PROCEDURE NOTE: LACERATION REPAIR Performed by: Darci CurrentBROWN, [place] N Authorized by: Darci CurrentBROWN, [place] N Consent: Verbal consent obtained. Risks and benefits: risks, benefits and alternatives were discussed Consent given by: patient Patient identity confirmed: provided demographic data Prepped and Draped in normal sterile fashion Wound explored  Laceration Location: LEFT PERIORBITAL  Laceration Length: 2cm  No Foreign Bodies seen or palpated  Anesthesia: NONE  Local anesthetic: lidocaine NONE  Anesthetic total: NONE  Irrigation method: syringe Amount of cleaning: standard  Skin closure: WOUND ADHESIVE   Patient tolerance: Patient tolerated the procedure well with no immediate complications.     RADIOLOGY  CT scan of the head and C-spine revealed: IMPRESSION: CT of the head: Chronic atrophic and ischemic changes without acute abnormality.  CT of the cervical spine: Multilevel degenerative change without acute abnormality.  Skin thickening in the posterior soft tissues of the neck on the right likely related to a long standing skin lesion    INITIAL IMPRESSION / ASSESSMENT AND  PLAN / ED COURSE  Pertinent labs & imaging results that were available during my care of the patient were reviewed by me and considered in my medical decision making (see chart for details).  History physical exam consistent with accidental fall with resultant left periorbital abrasion.  ____________________________________________   FINAL CLINICAL IMPRESSION(S) / ED DIAGNOSES  Final diagnoses:  Facial laceration, initial encounter      Darci Currentandolph N Inga Noller, MD 03/28/15 [phone]

## 2015-03-28 NOTE — ED Notes (Signed)
Area cleaned with sterile saline and gauze, bleeding controlled.

## 2015-03-28 NOTE — ED Notes (Signed)
Unsuccessful attempt to get urine from patient, specimen hat missed in attempt to get urine.

## 2015-04-02 ENCOUNTER — Ambulatory Visit (INDEPENDENT_AMBULATORY_CARE_PROVIDER_SITE_OTHER): Payer: Medicare Other | Admitting: Family Medicine

## 2015-04-02 ENCOUNTER — Encounter: Payer: Self-pay | Admitting: Family Medicine

## 2015-04-02 VITALS — BP 101/62 | HR 81 | Temp 97.7°F | Ht 62.1 in | Wt 100.9 lb

## 2015-04-02 DIAGNOSIS — N3000 Acute cystitis without hematuria: Secondary | ICD-10-CM

## 2015-04-02 DIAGNOSIS — R410 Disorientation, unspecified: Secondary | ICD-10-CM

## 2015-04-02 DIAGNOSIS — L0211 Cutaneous abscess of neck: Secondary | ICD-10-CM | POA: Diagnosis not present

## 2015-04-02 LAB — CBC WITH DIFFERENTIAL/PLATELET
HEMATOCRIT: 41.3 % (ref 34.0–46.6)
HEMOGLOBIN: 14.5 g/dL (ref 11.1–15.9)
LYMPHS ABS: 2.7 10*3/uL (ref 0.7–3.1)
Lymphs: 32 %
MCH: 32.1 pg (ref 26.6–33.0)
MCHC: 35.1 g/dL (ref 31.5–35.7)
MCV: 91 fL (ref 79–97)
MID (Absolute): 1.2 10*3/uL (ref 0.1–1.6)
MID: 15 %
NEUTROS ABS: 4.6 10*3/uL (ref 1.4–7.0)
Neutrophils: 54 %
Platelets: 259 10*3/uL (ref 150–379)
RBC: 4.52 x10E6/uL (ref 3.77–5.28)
RDW: 13.7 % (ref 12.3–15.4)
WBC: 8.5 10*3/uL (ref 3.4–10.8)

## 2015-04-02 MED ORDER — NITROFURANTOIN MONOHYD MACRO 100 MG PO CAPS: 100.0000 mg | ORAL_CAPSULE | Freq: Two times a day (BID) | ORAL | Status: DC

## 2015-04-02 NOTE — Progress Notes (Signed)
BP 101/62 mmHg  Pulse 81  Temp(Src) 97.7 F (36.5 C)  Ht 5' 2.1" (1.577 m)  Wt 100 lb 14.4 oz (45.768 kg)  BMI 18.40 kg/m2  SpO2 97%   Subjective:    Patient ID: Dana Riddle, female    DOB: 02/20/1929, 81 y.o.   MRN: [id number]  HPI: Dana Riddle is a 81 y.o. female  Chief Complaint  Patient presents with  . er follow up    boil on rt side of neck- 03/21/15 was given Bactrim X 10 days  . ER follow-up    pt fell on side walk- 03/28/15- laceration over left eye  . Urinary Tract Infection    take had a UTI recently, she currently has increased confusion and abnormal gait   Boil is better, but gone, still painful for her. Was started on bactrim, and took it for 10 days, but only had the wick in for 2 days and then it closed. It's still draining. They are concerned about it.   Cut over her eye looks great. Healing well. No sutures need to be removed as it was closed with glue.   Gait has still been off, still confused. Not able to give a urine today, will bring one in. Had been treated with cipro and bactrim, but no culture has been done.   Otherwise has been stable. No other concerns or complaints at this time.   Relevant past medical, surgical, family and social history reviewed and updated as indicated. Interim medical history since our last visit reviewed. Allergies and medications reviewed and updated.  Review of Systems  Constitutional: Negative.   Respiratory: Negative.   Cardiovascular: Negative.   Psychiatric/Behavioral: Positive for confusion. Negative for behavioral problems and agitation.    Per HPI unless specifically indicated above     Objective:    BP 101/62 mmHg  Pulse 81  Temp(Src) 97.7 F (36.5 C)  Ht 5' 2.1" (1.577 m)  Wt 100 lb 14.4 oz (45.768 kg)  BMI 18.40 kg/m2  SpO2 97%  Wt Readings from Last 3 Encounters:  04/02/15 100 lb 14.4 oz (45.768 kg)  03/27/15 104 lb (47.174 kg)  03/21/15 104 lb (47.174 kg)    Physical Exam   Constitutional: She is oriented to person, place, and time. She appears well-developed and well-nourished. No distress.  HENT:  Head: Normocephalic and atraumatic.  Right Ear: Hearing normal.  Left Ear: Hearing normal.  Nose: Nose normal.  Eyes: Conjunctivae and lids are normal. Right eye exhibits no discharge. Left eye exhibits no discharge. No scleral icterus.  Cardiovascular: Normal rate, regular rhythm and normal heart sounds.  Exam reveals no gallop and no friction rub.   No murmur heard. Pulmonary/Chest: Effort normal. No respiratory distress. She has no wheezes. She has no rales. She exhibits no tenderness.  Musculoskeletal: Normal range of motion.  Neurological: She is alert and oriented to person, place, and time.  Skin: Skin is intact. No rash noted.  Fluctuant 2cm erythematous papule on the R of her neck  Psychiatric: She has a normal mood and affect. Her speech is normal and behavior is normal. Judgment and thought content normal. Cognition and memory are normal.    Results for orders placed or performed in visit on 03/12/15  Microscopic Examination  Result Value Ref Range   WBC, UA >30 (A) 0 -  5 /hpf   RBC, UA 3-10 (A) 0 -  2 /hpf   Epithelial Cells (non renal) 0-10 0 - 10 /  hpf   Bacteria, UA Few None seen/Few  Urinalysis, Routine w reflex microscopic  Result Value Ref Range   Specific Gravity, UA 1.010 1.005 - 1.030   pH, UA 5.5 5.0 - 7.5   Color, UA Yellow Yellow   Appearance Ur Cloudy (A) Clear   Leukocytes, UA 1+ (A) Negative   Protein, UA Negative Negative/Trace   Glucose, UA 3+ (A) Negative   Ketones, UA Negative Negative   RBC, UA 2+ (A) Negative   Bilirubin, UA Negative Negative   Urobilinogen, Ur 0.2 0.2 - 1.0 mg/dL   Nitrite, UA Negative Negative   Microscopic Examination See below:   Basic metabolic panel  Result Value Ref Range   Glucose 218 (H) 65 - 99 mg/dL   BUN 16 8 - 27 mg/dL   Creatinine, Ser 1.61 0.57 - 1.00 mg/dL   GFR calc non Af Amer  52 (L) >59 mL/min/1.73   GFR calc Af Amer 60 >59 mL/min/1.73   BUN/Creatinine Ratio 16 11 - 26   Sodium 140 134 - 144 mmol/L   Potassium 5.1 3.5 - 5.2 mmol/L   Chloride 97 97 - 108 mmol/L   CO2 24 18 - 29 mmol/L   Calcium 9.5 8.7 - 10.3 mg/dL  Bayer DCA Hb W9U Waived  Result Value Ref Range   Bayer DCA Hb A1c Waived 8.5 (H) <7.0 %      Assessment & Plan:   Problem List Items Addressed This Visit      Genitourinary   Urinary tract infection   Relevant Medications   nitrofurantoin, macrocrystal-monohydrate, (MACROBID) 100 MG capsule   Other Relevant Orders   UA/M w/rflx Culture, Routine    Other Visit Diagnoses    Confusion    -  Primary    Possibly due to UTI, possibly due to recent abx. Will recheck urine and await results. CBC normal today.     Relevant Orders    UA/M w/rflx Culture, Routine    CBC With Differential/Platelet    Comprehensive metabolic panel    TSH    Abscess of neck        I&D'd today as discussed below. Will return for wick change in 2 days    Relevant Orders    Wound culture       Skin Procedure  Procedure: I&D Diagnosis: Abscess of the neck  Lesion Location/Size: 2cm R neck Physician: MJ Consent:  Risks, benefits, and alternative treatments discussed and all questions were answered.  Patient elected to proceed and verbal consent obtained.  Description: Area prepped and draped using semi-sterile technique. Area locally anesthetized using 2 cc's of lidocaine 1% with epi. Using a 15 blade scalpel, a 1cm incision was made above the lesion. Cyst cavity entered and mild amount of purulent material expressed. Wound packed with 5cm of iodaform gauze and covered with a bandaid. Post Procedure Instructions: Wound care instructions discussed and patient was instructed to keep area clean and dry.  Signs and symptoms of infection discussed, patient agrees to contact the office ASAP should they occur.  Dressing change recommended daily and to return in 2 days for  wick change. Follow up plan: Return in about 2 days (around 04/04/2015).

## 2015-04-03 ENCOUNTER — Other Ambulatory Visit: Payer: Self-pay | Admitting: Family Medicine

## 2015-04-03 ENCOUNTER — Telehealth: Payer: Self-pay | Admitting: Family Medicine

## 2015-04-03 DIAGNOSIS — N289 Disorder of kidney and ureter, unspecified: Secondary | ICD-10-CM

## 2015-04-03 LAB — COMPREHENSIVE METABOLIC PANEL
ALK PHOS: 117 IU/L (ref 39–117)
ALT: 11 IU/L (ref 0–32)
AST: 14 IU/L (ref 0–40)
Albumin/Globulin Ratio: 1.3 (ref 1.1–2.5)
Albumin: 4 g/dL (ref 3.5–4.7)
BUN/Creatinine Ratio: 13 (ref 11–26)
BUN: 17 mg/dL (ref 8–27)
CALCIUM: 8.9 mg/dL (ref 8.7–10.3)
CO2: 23 mmol/L (ref 18–29)
Chloride: 94 mmol/L — ABNORMAL LOW (ref 97–108)
Creatinine, Ser: 1.31 mg/dL — ABNORMAL HIGH (ref 0.57–1.00)
GFR calc Af Amer: 43 mL/min/{1.73_m2} — ABNORMAL LOW (ref >59)
GFR, EST NON AFRICAN AMERICAN: 38 mL/min/{1.73_m2} — AB (ref >59)
Globulin, Total: 3.1 g/dL (ref 1.5–4.5)
Glucose: 125 mg/dL — ABNORMAL HIGH (ref 65–99)
POTASSIUM: 4.7 mmol/L (ref 3.5–5.2)
SODIUM: 134 mmol/L (ref 134–144)
Total Protein: 7.1 g/dL (ref 6.0–8.5)

## 2015-04-03 LAB — MICROSCOPIC EXAMINATION

## 2015-04-03 LAB — TSH: TSH: 2.47 u[IU]/mL (ref 0.450–4.500)

## 2015-04-03 NOTE — Telephone Encounter (Addendum)
Please let caregiver know that she looks like she's a bit dehydrated, which may explain the confusion. Push fluids, icepops if she won't drink. We'll recheck her kidney function in a couple of weeks- it's only a mild increase. Her urine is also quite dirty. Start the antibiotic and we'll wait for the culture.

## 2015-04-03 NOTE — Telephone Encounter (Signed)
Spoke with patients caregiver, gave her Dr. Henriette CombsJohnson's message.

## 2015-04-04 ENCOUNTER — Ambulatory Visit (INDEPENDENT_AMBULATORY_CARE_PROVIDER_SITE_OTHER): Payer: Medicare Other | Admitting: Family Medicine

## 2015-04-04 ENCOUNTER — Encounter: Payer: Self-pay | Admitting: Family Medicine

## 2015-04-04 VITALS — BP 92/63 | HR 94 | Temp 97.8°F | Ht 62.1 in | Wt 100.0 lb

## 2015-04-04 DIAGNOSIS — IMO0001 Reserved for inherently not codable concepts without codable children: Secondary | ICD-10-CM

## 2015-04-04 DIAGNOSIS — T814XXD Infection following a procedure, subsequent encounter: Secondary | ICD-10-CM

## 2015-04-04 LAB — WOUND CULTURE

## 2015-04-04 NOTE — Progress Notes (Signed)
BP 92/63 mmHg  Pulse 94  Temp(Src) 97.8 F (36.6 C)  Ht 5' 2.1" (1.577 m)  Wt 100 lb (45.36 kg)  BMI 18.24 kg/m2  SpO2 93%   Subjective:    Patient ID: Dana Riddle, female    DOB: September 22, 1929, 81 y.o.   MRN: [id number]  HPI: Dana HuddleJosephine E Quain is a 81 y.o. female  Chief Complaint  Patient presents with  . wick change    right side of neck    Wound healing well. She is feeling well.   Relevant past medical, surgical, family and social history reviewed and updated as indicated. Interim medical history since our last visit reviewed. Allergies and medications reviewed and updated.  Review of Systems  Constitutional: Negative.   Skin: Negative.     Per HPI unless specifically indicated above     Objective:    BP 92/63 mmHg  Pulse 94  Temp(Src) 97.8 F (36.6 C)  Ht 5' 2.1" (1.577 m)  Wt 100 lb (45.36 kg)  BMI 18.24 kg/m2  SpO2 93%  Wt Readings from Last 3 Encounters:  04/04/15 100 lb (45.36 kg)  04/02/15 100 lb 14.4 oz (45.768 kg)  03/27/15 104 lb (47.174 kg)    Physical Exam  Skin: Skin is warm and dry.  Wound on back of R neck healing well with wick already removed. Good granualtion tissue, no oozing. No need for repacking  Nursing note and vitals reviewed.   Results for orders placed or performed in visit on 04/02/15  Wound culture  Result Value Ref Range   Gram Stain Result Final report    Result 1 Comment    RESULT 2 No organisms seen   Microscopic Examination  Result Value Ref Range   WBC, UA 11-30 (A) 0 -  5 /hpf   RBC, UA 0-2 0 -  2 /hpf   Epithelial Cells (non renal) 0-10 0 - 10 /hpf   Bacteria, UA Few None seen/Few  UA/M w/rflx Culture, Routine  Result Value Ref Range   Specific Gravity, UA 1.020 1.005 - 1.030   pH, UA 6.0 5.0 - 7.5   Color, UA Yellow Yellow   Appearance Ur Cloudy (A) Clear   Leukocytes, UA 1+ (A) Negative   Protein, UA 2+ (A) Negative/Trace   Glucose, UA 3+ (A) Negative   Ketones, UA Negative Negative   RBC, UA  Trace (A) Negative   Bilirubin, UA Negative Negative   Urobilinogen, Ur 0.2 0.2 - 1.0 mg/dL   Nitrite, UA Negative Negative   Microscopic Examination See below:    Urinalysis Reflex Comment   CBC With Differential/Platelet  Result Value Ref Range   WBC 8.5 3.4 - 10.8 x10E3/uL   RBC 4.52 3.77 - 5.28 x10E6/uL   Hemoglobin 14.5 11.1 - 15.9 g/dL   Hematocrit 69.641.3 29.534.0 - 46.6 %   MCV 91 79 - 97 fL   MCH 32.1 26.6 - 33.0 pg   MCHC 35.1 31.5 - 35.7 g/dL   RDW 28.413.7 13.212.3 - 44.015.4 %   Platelets 259 150 - 379 x10E3/uL   NEUTROPHILS 54 %   Lymphs 32 %   MID 15 %   Neutrophils Absolute 4.6 1.4 - 7.0 x10E3/uL   Lymphocytes Absolute 2.7 0.7 - 3.1 x10E3/uL   MID (Absolute) 1.2 0.1 - 1.6 X10E3/uL  Comprehensive metabolic panel  Result Value Ref Range   Glucose 125 (H) 65 - 99 mg/dL   BUN 17 8 - 27 mg/dL   Creatinine, Ser  1.31 (H) 0.57 - 1.00 mg/dL   GFR calc non Af Amer 38 (L) >59 mL/min/1.73   GFR calc Af Amer 43 (L) >59 mL/min/1.73   BUN/Creatinine Ratio 13 11 - 26   Sodium 134 134 - 144 mmol/L   Potassium 4.7 3.5 - 5.2 mmol/L   Chloride 94 (L) 97 - 108 mmol/L   CO2 23 18 - 29 mmol/L   Calcium 8.9 8.7 - 10.3 mg/dL   Total Protein 7.1 6.0 - 8.5 g/dL   Albumin 4.0 3.5 - 4.7 g/dL   Globulin, Total 3.1 1.5 - 4.5 g/dL   Albumin/Globulin Ratio 1.3 1.1 - 2.5   Bilirubin Total <0.2 0.0 - 1.2 mg/dL   Alkaline Phosphatase 117 39 - 117 IU/L   AST 14 0 - 40 IU/L   ALT 11 0 - 32 IU/L  TSH  Result Value Ref Range   TSH 2.470 0.450 - 4.500 uIU/mL      Assessment & Plan:   Problem List Items Addressed This Visit    None    Visit Diagnoses    Wound abscess, subsequent encounter    -  Primary    Healing well. No need for repacking. Keep covered with non-stick pad and bandaide. Change dressing daily.         Follow up plan: Return As scheduled with MAC.

## 2015-04-05 ENCOUNTER — Telehealth: Payer: Self-pay | Admitting: Family Medicine

## 2015-04-05 LAB — UA/M W/RFLX CULTURE, ROUTINE: Organism ID, Bacteria: NO GROWTH

## 2015-04-05 NOTE — Telephone Encounter (Signed)
Spoke with caregiver, notified her of the results of the culture. Pt has a rash, was told to discontinue the antibiotic. D/C order faxed to [phone].

## 2015-04-05 NOTE — Telephone Encounter (Signed)
Please let caregivers know that her urine culture was negative, but if they think she's doing better to finish the antibiotic.

## 2015-05-07 ENCOUNTER — Inpatient Hospital Stay: Payer: Medicare Other | Admitting: Unknown Physician Specialty

## 2015-06-17 ENCOUNTER — Ambulatory Visit: Payer: Medicare Other | Admitting: Family Medicine

## 2015-07-09 ENCOUNTER — Encounter: Payer: Self-pay | Admitting: Emergency Medicine

## 2015-07-09 ENCOUNTER — Inpatient Hospital Stay
Admission: EM | Admit: 2015-07-09 | Discharge: 2015-07-13 | DRG: 480 | Disposition: A | Discharge status: Skilled Nursing Facility | Payer: Medicare Other | Attending: Internal Medicine | Admitting: Internal Medicine

## 2015-07-09 ENCOUNTER — Emergency Department: Payer: Medicare Other

## 2015-07-09 DIAGNOSIS — Z79899 Other long term (current) drug therapy: Secondary | ICD-10-CM

## 2015-07-09 DIAGNOSIS — E785 Hyperlipidemia, unspecified: Secondary | ICD-10-CM | POA: Diagnosis present

## 2015-07-09 DIAGNOSIS — Z809 Family history of malignant neoplasm, unspecified: Secondary | ICD-10-CM

## 2015-07-09 DIAGNOSIS — Z794 Long term (current) use of insulin: Secondary | ICD-10-CM

## 2015-07-09 DIAGNOSIS — W19XXXA Unspecified fall, initial encounter: Secondary | ICD-10-CM | POA: Diagnosis present

## 2015-07-09 DIAGNOSIS — S72001A Fracture of unspecified part of neck of right femur, initial encounter for closed fracture: Secondary | ICD-10-CM | POA: Diagnosis present

## 2015-07-09 DIAGNOSIS — Z716 Tobacco abuse counseling: Secondary | ICD-10-CM | POA: Diagnosis not present

## 2015-07-09 DIAGNOSIS — G309 Alzheimer's disease, unspecified: Secondary | ICD-10-CM | POA: Diagnosis present

## 2015-07-09 DIAGNOSIS — Z7982 Long term (current) use of aspirin: Secondary | ICD-10-CM

## 2015-07-09 DIAGNOSIS — E1122 Type 2 diabetes mellitus with diabetic chronic kidney disease: Secondary | ICD-10-CM | POA: Diagnosis present

## 2015-07-09 DIAGNOSIS — F0280 Dementia in other diseases classified elsewhere without behavioral disturbance: Secondary | ICD-10-CM | POA: Diagnosis present

## 2015-07-09 DIAGNOSIS — R339 Retention of urine, unspecified: Secondary | ICD-10-CM | POA: Diagnosis present

## 2015-07-09 DIAGNOSIS — J189 Pneumonia, unspecified organism: Secondary | ICD-10-CM | POA: Diagnosis not present

## 2015-07-09 DIAGNOSIS — D72829 Elevated white blood cell count, unspecified: Secondary | ICD-10-CM

## 2015-07-09 DIAGNOSIS — E871 Hypo-osmolality and hyponatremia: Secondary | ICD-10-CM | POA: Diagnosis present

## 2015-07-09 DIAGNOSIS — F439 Reaction to severe stress, unspecified: Secondary | ICD-10-CM | POA: Diagnosis present

## 2015-07-09 DIAGNOSIS — F17210 Nicotine dependence, cigarettes, uncomplicated: Secondary | ICD-10-CM | POA: Diagnosis present

## 2015-07-09 DIAGNOSIS — F419 Anxiety disorder, unspecified: Secondary | ICD-10-CM | POA: Diagnosis present

## 2015-07-09 DIAGNOSIS — L409 Psoriasis, unspecified: Secondary | ICD-10-CM | POA: Diagnosis present

## 2015-07-09 DIAGNOSIS — Z419 Encounter for procedure for purposes other than remedying health state, unspecified: Secondary | ICD-10-CM

## 2015-07-09 DIAGNOSIS — F329 Major depressive disorder, single episode, unspecified: Secondary | ICD-10-CM | POA: Diagnosis present

## 2015-07-09 DIAGNOSIS — N189 Chronic kidney disease, unspecified: Secondary | ICD-10-CM | POA: Diagnosis present

## 2015-07-09 DIAGNOSIS — S72141A Displaced intertrochanteric fracture of right femur, initial encounter for closed fracture: Principal | ICD-10-CM | POA: Diagnosis present

## 2015-07-09 DIAGNOSIS — I129 Hypertensive chronic kidney disease with stage 1 through stage 4 chronic kidney disease, or unspecified chronic kidney disease: Secondary | ICD-10-CM | POA: Diagnosis present

## 2015-07-09 DIAGNOSIS — S72009A Fracture of unspecified part of neck of unspecified femur, initial encounter for closed fracture: Secondary | ICD-10-CM | POA: Diagnosis present

## 2015-07-09 LAB — URINALYSIS COMPLETE WITH MICROSCOPIC (ARMC ONLY)
BILIRUBIN URINE: NEGATIVE
Bacteria, UA: NONE SEEN
HGB URINE DIPSTICK: NEGATIVE
KETONES UR: NEGATIVE mg/dL
Leukocytes, UA: NEGATIVE
NITRITE: NEGATIVE
Protein, ur: NEGATIVE mg/dL
SPECIFIC GRAVITY, URINE: 1.011 (ref 1.005–1.030)
Squamous Epithelial / LPF: NONE SEEN
pH: 6 (ref 5.0–8.0)

## 2015-07-09 LAB — COMPREHENSIVE METABOLIC PANEL
ALK PHOS: 83 U/L (ref 38–126)
ALT: 12 U/L — ABNORMAL LOW (ref 14–54)
ANION GAP: 5 (ref 5–15)
AST: 17 U/L (ref 15–41)
Albumin: 2.9 g/dL — ABNORMAL LOW (ref 3.5–5.0)
BUN: 11 mg/dL (ref 6–20)
CALCIUM: 8.3 mg/dL — AB (ref 8.9–10.3)
CO2: 28 mmol/L (ref 22–32)
CREATININE: 0.97 mg/dL (ref 0.44–1.00)
Chloride: 98 mmol/L — ABNORMAL LOW (ref 101–111)
GFR, EST NON AFRICAN AMERICAN: 52 mL/min — AB (ref >60)
Glucose, Bld: 118 mg/dL — ABNORMAL HIGH (ref 65–99)
Potassium: 4.1 mmol/L (ref 3.5–5.1)
SODIUM: 131 mmol/L — AB (ref 135–145)
Total Bilirubin: 0.4 mg/dL (ref 0.3–1.2)
Total Protein: 6.9 g/dL (ref 6.5–8.1)

## 2015-07-09 LAB — CBC WITH DIFFERENTIAL/PLATELET
BASOS ABS: 0 10*3/uL (ref 0–0.1)
BASOS PCT: 0 %
EOS ABS: 0.2 10*3/uL (ref 0–0.7)
Eosinophils Relative: 2 %
HEMATOCRIT: 36.1 % (ref 35.0–47.0)
HEMOGLOBIN: 11.8 g/dL — AB (ref 12.0–16.0)
Lymphocytes Relative: 14 %
Lymphs Abs: 1.7 10*3/uL (ref 1.0–3.6)
MCH: 30.1 pg (ref 26.0–34.0)
MCHC: 32.7 g/dL (ref 32.0–36.0)
MCV: 92.2 fL (ref 80.0–100.0)
Monocytes Absolute: 0.9 10*3/uL (ref 0.2–0.9)
Monocytes Relative: 8 %
NEUTROS ABS: 9.5 10*3/uL — AB (ref 1.4–6.5)
NEUTROS PCT: 76 %
Platelets: 399 10*3/uL (ref 150–440)
RBC: 3.91 MIL/uL (ref 3.80–5.20)
RDW: 13.4 % (ref 11.5–14.5)
WBC: 12.4 10*3/uL — ABNORMAL HIGH (ref 3.6–11.0)

## 2015-07-09 LAB — PROTIME-INR
INR: 1.07
PROTHROMBIN TIME: 14.1 s (ref 11.4–15.0)

## 2015-07-09 LAB — ABO/RH: ABO/RH(D): A POS

## 2015-07-09 LAB — TYPE AND SCREEN
ABO/RH(D): A POS
Antibody Screen: NEGATIVE

## 2015-07-09 LAB — APTT: APTT: 34 s (ref 24–36)

## 2015-07-09 MED ORDER — NICOTINE 21 MG/24HR TD PT24
21.0000 mg | MEDICATED_PATCH | Freq: Every day | TRANSDERMAL | Status: DC
  Administered 2015-07-10 – 2015-07-13 (×4): 21 mg via TRANSDERMAL
  Filled 2015-07-09 (×4): qty 1

## 2015-07-09 MED ORDER — WHITE PETROLATUM GEL
1.0000 | Status: DC | PRN
  Filled 2015-07-09: qty 28.35

## 2015-07-09 MED ORDER — CEFAZOLIN SODIUM-DEXTROSE 2-3 GM-% IV SOLR
2.0000 g | INTRAVENOUS | Status: AC
  Administered 2015-07-10: 2 g via INTRAVENOUS
  Filled 2015-07-09: qty 50

## 2015-07-09 MED ORDER — CLONAZEPAM 0.5 MG PO TABS
0.2500 mg | ORAL_TABLET | Freq: Every day | ORAL | Status: DC | PRN
  Administered 2015-07-11 – 2015-07-12 (×2): 0.25 mg via ORAL
  Filled 2015-07-09 (×2): qty 1

## 2015-07-09 MED ORDER — SENNOSIDES-DOCUSATE SODIUM 8.6-50 MG PO TABS
1.0000 | ORAL_TABLET | Freq: Two times a day (BID) | ORAL | Status: DC
  Administered 2015-07-10 (×2): 1 via ORAL
  Filled 2015-07-09: qty 1

## 2015-07-09 MED ORDER — ACETAMINOPHEN 325 MG PO TABS
650.0000 mg | ORAL_TABLET | Freq: Four times a day (QID) | ORAL | Status: DC | PRN
  Administered 2015-07-09: 650 mg via ORAL
  Filled 2015-07-09: qty 2

## 2015-07-09 MED ORDER — FLEET ENEMA 7-19 GM/118ML RE ENEM: 1.0000 | ENEMA | Freq: Once | RECTAL | Status: DC | PRN

## 2015-07-09 MED ORDER — ONDANSETRON HCL 4 MG/2ML IJ SOLN
4.0000 mg | Freq: Once | INTRAMUSCULAR | Status: AC
  Administered 2015-07-09: 4 mg via INTRAVENOUS
  Filled 2015-07-09: qty 2

## 2015-07-09 MED ORDER — BISACODYL 10 MG RE SUPP: 10.0000 mg | Freq: Every day | RECTAL | Status: DC | PRN

## 2015-07-09 MED ORDER — DOCUSATE SODIUM 100 MG PO CAPS: 100.0000 mg | ORAL_CAPSULE | Freq: Two times a day (BID) | ORAL | Status: DC

## 2015-07-09 MED ORDER — OXYCODONE HCL 5 MG PO TABS: 5.0000 mg | ORAL_TABLET | ORAL | Status: DC | PRN

## 2015-07-09 MED ORDER — SODIUM CHLORIDE 0.9 % IV SOLN
INTRAVENOUS | Status: DC
  Administered 2015-07-09 – 2015-07-10 (×2): via INTRAVENOUS

## 2015-07-09 MED ORDER — MIRTAZAPINE 15 MG PO TABS
15.0000 mg | ORAL_TABLET | Freq: Every day | ORAL | Status: DC
  Administered 2015-07-10 – 2015-07-12 (×4): 15 mg via ORAL
  Filled 2015-07-09 (×4): qty 1

## 2015-07-09 MED ORDER — MORPHINE SULFATE (PF) 2 MG/ML IV SOLN
2.0000 mg | INTRAVENOUS | Status: DC | PRN
  Administered 2015-07-10 (×3): 2 mg via INTRAVENOUS
  Filled 2015-07-09 (×4): qty 1

## 2015-07-09 MED ORDER — VORTIOXETINE HBR 10 MG PO TABS
10.0000 mg | ORAL_TABLET | Freq: Every day | ORAL | Status: DC
  Administered 2015-07-10 – 2015-07-12 (×5): 10 mg via ORAL
  Filled 2015-07-09 (×6): qty 1

## 2015-07-09 MED ORDER — MAGNESIUM HYDROXIDE 400 MG/5ML PO SUSP: 30.0000 mL | Freq: Every day | ORAL | Status: DC | PRN

## 2015-07-09 MED ORDER — CHLORHEXIDINE GLUCONATE CLOTH 2 % EX PADS
6.0000 | MEDICATED_PAD | Freq: Every day | CUTANEOUS | Status: DC
  Administered 2015-07-09: 6 via TOPICAL

## 2015-07-09 MED ORDER — ACETAMINOPHEN 650 MG RE SUPP: 650.0000 mg | Freq: Four times a day (QID) | RECTAL | Status: DC | PRN

## 2015-07-09 MED ORDER — MORPHINE SULFATE (PF) 4 MG/ML IV SOLN
4.0000 mg | INTRAVENOUS | Status: DC | PRN
  Administered 2015-07-09: 4 mg via INTRAVENOUS
  Filled 2015-07-09: qty 1

## 2015-07-09 NOTE — H&P (Signed)
[facility] [place] at [facility]   PATIENT NAME: Dana Riddle    MR#:  [id number]  DATE OF BIRTH:  09/26/1929  DATE OF ADMISSION:  07/09/2015  PRIMARY CARE PHYSICIAN: Vonita Moss, MD   REQUESTING/REFERRING PHYSICIAN: Jene Every  CHIEF COMPLAINT:   Chief Complaint  Patient presents with  . Fall    HISTORY OF PRESENT ILLNESS:  Dana Riddle  is a 81 y.o. female with a known history of diabetes, depression, psoriasis. The patient is a very poor historian. She stated her foot slipped out from underneath her and had a fall. No loss of consciousness. Found to have a right hip fracture in the ER. Hospitalist services were contacted for further evaluation. Orthopedic surgery was consulted through the emergency room. The patient has a DSS agent Lazaro Arms [phone].  PAST MEDICAL HISTORY:   Past Medical History  Diagnosis Date  . Diabetes mellitus without complication (HCC)   . Hyperlipidemia   . Hypertension   . Chronic kidney disease   . Alzheimer's dementia     PAST SURGICAL HISTORY:  History reviewed. No pertinent past surgical history.  SOCIAL HISTORY:   Social History  Substance Use Topics  . Smoking status: Current Every Day Smoker -- 1.00 packs/day  . Smokeless tobacco: Never Used  . Alcohol Use: No    FAMILY HISTORY:   Family History  Problem Relation Age of Onset  . Cancer Father     lung    DRUG ALLERGIES:  No Known Allergies  REVIEW OF SYSTEMS:  CONSTITUTIONAL: No fever, fatigue or weakness.  EYES: No blurred or double vision.  EARS, NOSE, AND THROAT: No tinnitus or ear pain. No sore throat. Poor hearing. RESPIRATORY: No cough, shortness of breath, wheezing or hemoptysis.  CARDIOVASCULAR: No chest pain, orthopnea, edema.  GASTROINTESTINAL: No nausea, vomiting, diarrhea or abdominal pain. No blood in bowel movements GENITOURINARY: No dysuria, hematuria.  ENDOCRINE: No polyuria, nocturia,  HEMATOLOGY: No  anemia, easy bruising or bleeding SKIN: Psoriasis lower extremity MUSCULOSKELETAL: Right leg pain NEUROLOGIC: No tingling, numbness, weakness.  PSYCHIATRY: Positive for anxiety or depression.   MEDICATIONS AT HOME:   Prior to Admission medications   Medication Sig Start Date End Date Taking? Authorizing Provider  acetaminophen (TYLENOL) 500 MG tablet Take 500 mg by mouth every 6 (six) hours as needed for fever or headache.   Yes Historical Provider, MD  aspirin EC 81 MG tablet Take 81 mg by mouth daily.   Yes Historical Provider, MD  clonazePAM (KLONOPIN) 0.5 MG tablet Take 0.25 mg by mouth daily as needed (for agitation).   Yes Historical Provider, MD  empagliflozin (JARDIANCE) 10 MG TABS tablet Take 10 mg by mouth daily.   Yes Historical Provider, MD  insulin detemir (LEVEMIR) 100 UNIT/ML injection Inject 20 Units into the skin daily.    Yes Historical Provider, MD  mirtazapine (REMERON) 15 MG tablet Take 15 mg by mouth at bedtime.    Yes Historical Provider, MD  sitaGLIPtin (JANUVIA) 50 MG tablet Take 50 mg by mouth daily.   Yes Historical Provider, MD  Vortioxetine HBr (TRINTELLIX) 10 MG TABS Take 10 mg by mouth at bedtime.    Yes Historical Provider, MD  white petrolatum (VASELINE) GEL Apply 1 application topically as needed for dry skin. Pt applies to legs.   Yes Historical Provider, MD  nitrofurantoin, macrocrystal-monohydrate, (MACROBID) 100 MG capsule Take 1 capsule (100 mg total) by mouth 2 (two) times daily. Patient not taking: Reported on 07/09/2015 04/02/15  Megan P Johnson, DO      VITAL SIGNS:  Blood pressure 97/55, pulse 59, temperature 97.5 F (36.4 C), temperature source Oral, resp. rate 21, weight 47.628 kg (105 lb), SpO2 95 %.  PHYSICAL EXAMINATION:  GENERAL:  81 y.o.-year-old patient lying in the bed with no acute distress.  EYES: Pupils equal, round, reactive to light and accommodation. No scleral icterus. Extraocular muscles intact.  HEENT: Head atraumatic,  normocephalic. Oropharynx and nasopharynx clear.  NECK:  Supple, no jugular venous distention. No thyroid enlargement, no tenderness.  LUNGS: Decreased breath sounds bilaterally, no wheezing, rales,rhonchi or crepitation. No use of accessory muscles of respiration.  CARDIOVASCULAR: S1, S2 normal. No murmurs, rubs, or gallops.  ABDOMEN: Soft, nontender, nondistended. Bowel sounds present. No organomegaly or mass.  EXTREMITIES: No pedal edema, cyanosis, or clubbing.  NEUROLOGIC: Cranial nerves II through XII grossly intact. Patient is able to lift up the left leg. Sensation intact to light touch bilateral lower extremities. PSYCHIATRIC: The patient is alert and oriented x 3.  SKIN: Psoriasis bilateral shins lower extremity   LABORATORY PANEL:   CBC  Recent Labs Lab 07/09/15 1336  WBC 12.4*  HGB 11.8*  HCT 36.1  PLT 399   ------------------------------------------------------------------------------------------------------------------  Chemistries   Recent Labs Lab 07/09/15 1336  NA 131*  K 4.1  CL 98*  CO2 28  GLUCOSE 118*  BUN 11  CREATININE 0.97  CALCIUM 8.3*  AST 17  ALT 12*  ALKPHOS 83  BILITOT 0.4   ------------------------------------------------------------------------------------------------------------------    RADIOLOGY:  Dg Chest 1 View  07/09/2015   CLINICAL DATA:  Unwitnessed fall, right hip pain  EXAM: CHEST 1 VIEW  COMPARISON:  05/31/2013  FINDINGS: There are low lung volumes with crowding of the interstitial markings. There is no focal parenchymal opacity. There is no pleural effusion or pneumothorax. The heart and mediastinal contours are unremarkable.  The osseous structures are unremarkable.  IMPRESSION: No active disease.   Electronically Signed   By: Elige Ko   On: 07/09/2015 14:44   Ct Head Wo Contrast  07/09/2015   CLINICAL DATA:  Unwitnessed fall, initial encounter  EXAM: CT HEAD WITHOUT CONTRAST  CT CERVICAL SPINE WITHOUT CONTRAST   TECHNIQUE: Multidetector CT imaging of the head and cervical spine was performed following the standard protocol without intravenous contrast. Multiplanar CT image reconstructions of the cervical spine were also generated.  COMPARISON:  03/27/2015  FINDINGS: CT HEAD FINDINGS  The bony calvarium is intact. No gross soft tissue abnormality is seen. Mucosal thickening is noted within the ethmoid and maxillary sinuses significantly increased from the prior exam. There are atrophic changes and chronic white matter ischemic changes identified. These are similar to that seen on the prior exam. No findings to suggest acute hemorrhage, acute infarction or space-occupying mass lesion are noted.  CT CERVICAL SPINE FINDINGS  Seven cervical segments are well visualized. Vertebral body height is well maintained. Disc space narrowing is noted from C3 to C6 with associated osteophytic changes. Multilevel facet hypertrophic changes are seen. No acute fracture or acute facet abnormality is noted. The surrounding soft tissue structures show improvement in the skin thickening seen in the posterior right neck on the prior exam. No acute soft tissue abnormality is noted. The visualized lung apices are within normal limits.  IMPRESSION: CT of the head: Stable atrophic in chronic white matter ischemic changes. No acute abnormality noted.  CT of the cervical spine: Degenerative changes without acute abnormality.   Electronically Signed   By: Loraine Leriche  Lukens M.D.   On: 07/09/2015 14:43   Ct Cervical Spine Wo Contrast  07/09/2015   CLINICAL DATA:  Unwitnessed fall, initial encounter  EXAM: CT HEAD WITHOUT CONTRAST  CT CERVICAL SPINE WITHOUT CONTRAST  TECHNIQUE: Multidetector CT imaging of the head and cervical spine was performed following the standard protocol without intravenous contrast. Multiplanar CT image reconstructions of the cervical spine were also generated.  COMPARISON:  03/27/2015  FINDINGS: CT HEAD FINDINGS  The bony calvarium  is intact. No gross soft tissue abnormality is seen. Mucosal thickening is noted within the ethmoid and maxillary sinuses significantly increased from the prior exam. There are atrophic changes and chronic white matter ischemic changes identified. These are similar to that seen on the prior exam. No findings to suggest acute hemorrhage, acute infarction or space-occupying mass lesion are noted.  CT CERVICAL SPINE FINDINGS  Seven cervical segments are well visualized. Vertebral body height is well maintained. Disc space narrowing is noted from C3 to C6 with associated osteophytic changes. Multilevel facet hypertrophic changes are seen. No acute fracture or acute facet abnormality is noted. The surrounding soft tissue structures show improvement in the skin thickening seen in the posterior right neck on the prior exam. No acute soft tissue abnormality is noted. The visualized lung apices are within normal limits.  IMPRESSION: CT of the head: Stable atrophic in chronic white matter ischemic changes. No acute abnormality noted.  CT of the cervical spine: Degenerative changes without acute abnormality.   Electronically Signed   By: Alcide Clever M.D.   On: 07/09/2015 14:43   Dg Hip Unilat With Pelvis 2-3 Views Right  07/09/2015   CLINICAL DATA:  Right hip pain after fall.  EXAM: DG HIP (WITH OR WITHOUT PELVIS) 2-3V RIGHT  COMPARISON:  None.  FINDINGS: There appears to be mildly displaced fracture overlying the trochanteric region which potentially may extend into the femoral neck. CT scan may be performed for further evaluation. No significant degenerative changes seen involving either hip joint.  IMPRESSION: Mildly displaced fracture is seen in the trochanteric region of the proximal right femur. CT scan of the hip is recommended to evaluate for possible involvement of the femoral neck.   Electronically Signed   By: Lupita Raider, M.D.   On: 07/09/2015 14:44    EKG:   Normal sinus rhythm 66 bpm no acute ST-T  wave changes  IMPRESSION AND PLAN:   1. Preoperative evaluation for right hip fracture. Must get consent from DSS Lazaro Arms at [phone]. I see no current indications to surgery at this time. Patient denied having a hip fracture when I told her. Unable to get a good history from the patient but she did tell me she slipped and fell. No further workup prior to surgery. 2. Type 2 diabetes- I will put on sliding scale and hold medications at this point 3. Anxiety and Depression- continue psychiatric medications 4. Psoriasis 5. Tobacco abuse- smoking cessation counseling done 3 minutes by me. Nicotine patch applied.  All the records are reviewed and case discussed with ED provider. Management plans discussed with the patient.  CODE STATUS: Full code  TOTAL TIME TAKING CARE OF THIS PATIENT: 50 minutes.    Alford Highland M.D on 07/09/2015 at 4:44 PM  Between 7am to 6pm - Pager - [phone]  After 6pm call admission pager [phone]  [facility]  Office  [phone]  CC: Primary care physician; Vonita Moss, MD

## 2015-07-09 NOTE — ED Notes (Signed)
Pt to ED via EMS from [facility] c/o unwitnessed fall without LOC. Per EMS staff report they heard her fall, pt states she doesn't know what happened. Alert, oriented to self only. Hx Alzheimer's dementia

## 2015-07-09 NOTE — Progress Notes (Signed)
Dr. Sherryll Burger notified of patient unable to void and bladder scanned at >541, no fluid orders, diet and no teds/scds ordered. MD ordered foley, ns at 75 ml/hr, teds/scds, full liquid until midnight, npo after midnight.

## 2015-07-09 NOTE — ED Notes (Signed)
Pt is HOH, states she did not hit her head when she fell. Skin tear noted to R side of chin. Pt has skin rash to BLE.

## 2015-07-09 NOTE — ED Provider Notes (Signed)
[facility] Emergency Department Provider Note  ____________________________________________  Time seen: On arrival, via EMS  I have reviewed the triage vital signs and the nursing notes.   HISTORY  Chief Complaint Fall  History limited by also has dementia.  HPI Dana Riddle is a 81 y.o. female who presents from [facility] assisted [facility] after a fall. She reported a has a history of Alzheimer's dementia. Her fall was unwitnessed but she was found on the floor. Patient is unable to give me further history. She does complain of right leg pain. She denies other injuries.     Past Medical History  Diagnosis Date  . Diabetes mellitus without complication (HCC)   . Hyperlipidemia   . Hypertension   . Chronic kidney disease   . Alzheimer's dementia     Patient Active Problem List   Diagnosis Date Noted  . Hypertension 03/12/2015  . Diabetes mellitus without complication (HCC) 03/12/2015  . Urinary tract infection 03/12/2015    History reviewed. No pertinent past surgical history.  Current Outpatient Rx  Name  Route  Sig  Dispense  Refill  . acetaminophen (TYLENOL) 500 MG tablet   Oral   Take 500 mg by mouth every 6 (six) hours as needed for fever or headache.         Marland Kitchen aspirin EC 81 MG tablet   Oral   Take 81 mg by mouth daily.         . clonazePAM (KLONOPIN) 0.5 MG tablet   Oral   Take 0.25 mg by mouth daily as needed (for agitation).         Marland Kitchen empagliflozin (JARDIANCE) 10 MG TABS tablet   Oral   Take 10 mg by mouth daily.         . insulin detemir (LEVEMIR) 100 UNIT/ML injection   Subcutaneous   Inject 20 Units into the skin daily.          . mirtazapine (REMERON) 15 MG tablet   Oral   Take 15 mg by mouth at bedtime.          . sitaGLIPtin (JANUVIA) 50 MG tablet   Oral   Take 50 mg by mouth daily.         . Vortioxetine HBr (TRINTELLIX) 10 MG TABS   Oral   Take 10 mg by mouth at bedtime.          .  nitrofurantoin, macrocrystal-monohydrate, (MACROBID) 100 MG capsule   Oral   Take 1 capsule (100 mg total) by mouth 2 (two) times daily. Patient not taking: Reported on 07/09/2015   14 capsule   0     Allergies Review of patient's allergies indicates no known allergies.  Family History  Problem Relation Age of Onset  . Cancer Father     lung    Social History Social History  Substance Use Topics  . Smoking status: Current Every Day Smoker -- 1.00 packs/day  . Smokeless tobacco: Never Used  . Alcohol Use: No    Review of Systems Limited by altered dementia  Patient complains only of leg pain otherwise denies 10 point review of systems       ____________________________________________   PHYSICAL EXAM:  VITAL SIGNS: ED Triage Vitals  Enc Vitals Group     BP 07/09/15 1329 112/57 mmHg     Pulse Rate 07/09/15 1329 63     Resp 07/09/15 1329 15     Temp 07/09/15 1329 97.5 F (36.4 C)  Temp Source 07/09/15 1329 Oral     SpO2 07/09/15 1329 96 %     Weight 07/09/15 1329 105 lb (47.628 kg)     Height --      Head Cir --      Peak Flow --      Pain Score --      Pain Loc --      Pain Edu? --      Excl. in GC? --      Constitutional:Well appearing and in no distress. Hard of hearing Eyes: Conjunctivae are normal.  ENT   Head: Normocephalic. 1 cm Small shallow laceration to the chin, no bleeding   Mouth/Throat: Mucous membranes are moist. Cardiovascular: Normal rate, regular rhythm. Normal and symmetric distal pulses are present in all extremities. No murmurs, rubs, or gallops. Respiratory: Normal respiratory effort without tachypnea nor retractions. Breath sounds are clear and equal bilaterally.  Gastrointestinal: Soft and non-tender in all quadrants. No distention. There is no CVA tenderness. Genitourinary: deferred Musculoskeletal: No vertebral tenderness to palpation. Full range of motion of cervical spine without discomfort. Patient does have pain  in the right leg when I have ranged it passively. She has no pain in the left leg. Her upper extremity is or normal. Neurologic:  Normal speech and language. No gross focal neurologic deficits are appreciated. Skin:  Skin is warm, dry. Laceration to chin as above. Psychiatric: Mood and affect are normal.   ____________________________________________    LABS (pertinent positives/negatives)  Labs Reviewed  CBC WITH DIFFERENTIAL/PLATELET  PROTIME-INR  COMPREHENSIVE METABOLIC PANEL  APTT  URINALYSIS COMPLETEWITH MICROSCOPIC ([facility] ONLY)  TYPE AND SCREEN    ____________________________________________   EKG  ED ECG REPORT I, Jene Every, the attending physician, personally viewed and interpreted this ECG.  Date: 07/09/2015 EKG Time: 1:36 PM Rate: 66 Rhythm: normal sinus rhythm QRS Axis: normal Intervals: normal ST/T Wave abnormalities: normal Conduction Disutrbances: none Narrative Interpretation: unremarkable   ____________________________________________    RADIOLOGY I have personally reviewed any xrays that were ordered on this patient: Right-sided trochanteric hip fracture  ____________________________________________   PROCEDURES  Procedure(s) performed: none  Critical Care performed: none  ____________________________________________   INITIAL IMPRESSION / ASSESSMENT AND PLAN / ED COURSE  Pertinent labs & imaging results that were available during my care of the patient were reviewed by me and considered in my medical decision making (see chart for details).  Patient presents after unwitnessed fall from [facility] unit. On exam small abrasion to the chin and pain with passive range of motion of the right hip area we'll obtain CT head and cervical spine given chin injury and x-rays of the right hip  Hip x-ray shows a contact fracture. I will consult orthopedics and admit the patient to  medicine  ____________________________________________   FINAL CLINICAL IMPRESSION(S) / ED DIAGNOSES  Final diagnoses:  Hip fracture, right, closed, initial encounter (HCC)     Jene Every, MD 07/09/15 [phone]

## 2015-07-09 NOTE — Consult Note (Signed)
ORTHOPAEDIC CONSULTATION  PATIENT NAME: Dana Riddle DOB: [birth year]  MRN: [id number]  REQUESTING PHYSICIAN: Alford Highland, MD  Chief Complaint: Right hip pain  HPI: Dana Riddle is a 81 y.o. female who complains of  right hip pain. She is a resident of [facility] who apparently sustained a mechanical fall earlier today and landed on her right hip and side. The patient has a history of dementia and is a poor historian. However, according to reports there was no loss of consciousness. She was unable to stand or bear weight due to complaints of right hip pain. [facility] DSS is listed as her guardian.  Past Medical History  Diagnosis Date  . Diabetes mellitus without complication (HCC)   . Hyperlipidemia   . Hypertension   . Chronic kidney disease   . Alzheimer's dementia    History reviewed. No pertinent past surgical history. Social History   Social History  . Marital Status: Divorced    Spouse Name: N/A  . Number of Children: N/A  . Years of Education: N/A   Social History Main Topics  . Smoking status: Current Every Day Smoker -- 1.00 packs/day  . Smokeless tobacco: Never Used  . Alcohol Use: No  . Drug Use: No  . Sexual Activity: Not Asked   Other Topics Concern  . None   Social History Narrative   Family History  Problem Relation Age of Onset  . Cancer Father     lung   No Known Allergies Prior to Admission medications   Medication Sig Start Date End Date Taking? Authorizing Provider  acetaminophen (TYLENOL) 500 MG tablet Take 500 mg by mouth every 6 (six) hours as needed for fever or headache.   Yes Historical Provider, MD  aspirin EC 81 MG tablet Take 81 mg by mouth daily.   Yes Historical Provider, MD  clonazePAM (KLONOPIN) 0.5 MG tablet Take 0.25 mg by mouth daily as needed (for agitation).   Yes Historical Provider, MD  empagliflozin (JARDIANCE) 10 MG TABS tablet Take 10 mg by mouth daily.   Yes Historical Provider, MD  insulin  detemir (LEVEMIR) 100 UNIT/ML injection Inject 20 Units into the skin daily.    Yes Historical Provider, MD  mirtazapine (REMERON) 15 MG tablet Take 15 mg by mouth at bedtime.    Yes Historical Provider, MD  sitaGLIPtin (JANUVIA) 50 MG tablet Take 50 mg by mouth daily.   Yes Historical Provider, MD  Vortioxetine HBr (TRINTELLIX) 10 MG TABS Take 10 mg by mouth at bedtime.    Yes Historical Provider, MD  white petrolatum (VASELINE) GEL Apply 1 application topically as needed for dry skin. Pt applies to legs.   Yes Historical Provider, MD  nitrofurantoin, macrocrystal-monohydrate, (MACROBID) 100 MG capsule Take 1 capsule (100 mg total) by mouth 2 (two) times daily. Patient not taking: Reported on 07/09/2015 04/02/15   Dorcas Carrow, DO   Dg Chest 1 View  07/09/2015   CLINICAL DATA:  Unwitnessed fall, right hip pain  EXAM: CHEST 1 VIEW  COMPARISON:  05/31/2013  FINDINGS: There are low lung volumes with crowding of the interstitial markings. There is no focal parenchymal opacity. There is no pleural effusion or pneumothorax. The heart and mediastinal contours are unremarkable.  The osseous structures are unremarkable.  IMPRESSION: No active disease.   Electronically Signed   By: Elige Ko   On: 07/09/2015 14:44   Ct Head Wo Contrast  07/09/2015   CLINICAL DATA:  Unwitnessed fall, initial  encounter  EXAM: CT HEAD WITHOUT CONTRAST  CT CERVICAL SPINE WITHOUT CONTRAST  TECHNIQUE: Multidetector CT imaging of the head and cervical spine was performed following the standard protocol without intravenous contrast. Multiplanar CT image reconstructions of the cervical spine were also generated.  COMPARISON:  03/27/2015  FINDINGS: CT HEAD FINDINGS  The bony calvarium is intact. No gross soft tissue abnormality is seen. Mucosal thickening is noted within the ethmoid and maxillary sinuses significantly increased from the prior exam. There are atrophic changes and chronic white matter ischemic changes identified. These  are similar to that seen on the prior exam. No findings to suggest acute hemorrhage, acute infarction or space-occupying mass lesion are noted.  CT CERVICAL SPINE FINDINGS  Seven cervical segments are well visualized. Vertebral body height is well maintained. Disc space narrowing is noted from C3 to C6 with associated osteophytic changes. Multilevel facet hypertrophic changes are seen. No acute fracture or acute facet abnormality is noted. The surrounding soft tissue structures show improvement in the skin thickening seen in the posterior right neck on the prior exam. No acute soft tissue abnormality is noted. The visualized lung apices are within normal limits.  IMPRESSION: CT of the head: Stable atrophic in chronic white matter ischemic changes. No acute abnormality noted.  CT of the cervical spine: Degenerative changes without acute abnormality.   Electronically Signed   By: Alcide Clever M.D.   On: 07/09/2015 14:43   Ct Cervical Spine Wo Contrast  07/09/2015   CLINICAL DATA:  Unwitnessed fall, initial encounter  EXAM: CT HEAD WITHOUT CONTRAST  CT CERVICAL SPINE WITHOUT CONTRAST  TECHNIQUE: Multidetector CT imaging of the head and cervical spine was performed following the standard protocol without intravenous contrast. Multiplanar CT image reconstructions of the cervical spine were also generated.  COMPARISON:  03/27/2015  FINDINGS: CT HEAD FINDINGS  The bony calvarium is intact. No gross soft tissue abnormality is seen. Mucosal thickening is noted within the ethmoid and maxillary sinuses significantly increased from the prior exam. There are atrophic changes and chronic white matter ischemic changes identified. These are similar to that seen on the prior exam. No findings to suggest acute hemorrhage, acute infarction or space-occupying mass lesion are noted.  CT CERVICAL SPINE FINDINGS  Seven cervical segments are well visualized. Vertebral body height is well maintained. Disc space narrowing is noted from  C3 to C6 with associated osteophytic changes. Multilevel facet hypertrophic changes are seen. No acute fracture or acute facet abnormality is noted. The surrounding soft tissue structures show improvement in the skin thickening seen in the posterior right neck on the prior exam. No acute soft tissue abnormality is noted. The visualized lung apices are within normal limits.  IMPRESSION: CT of the head: Stable atrophic in chronic white matter ischemic changes. No acute abnormality noted.  CT of the cervical spine: Degenerative changes without acute abnormality.   Electronically Signed   By: Alcide Clever M.D.   On: 07/09/2015 14:43   Ct Hip Right Wo Contrast  07/09/2015   CLINICAL DATA:  Unwitnessed fall.  Hip pain.  Fracture.  EXAM: CT OF THE RIGHT HIP WITHOUT CONTRAST  TECHNIQUE: Multidetector CT imaging of the right hip was performed according to the standard protocol. Multiplanar CT image reconstructions were also generated.  COMPARISON:  07/09/2015  FINDINGS: Intertrochanteric fracture of the right femur does appear to have a component which extends along the basicervical region of the right femoral neck anteriorly, for example on image 28 of series 6 and image  33 series 2, and this may have an intra-articular component.  There is some degenerative articular space narrowing in the right hip. Bony demineralization. Aortoiliac atherosclerotic vascular disease. Fluid density lesion along the right posterior pelvis could reflect a hydrosalpinx or cystic lesion of the right ovary.  There is spurring of the right acetabulum but the acetabulum is otherwise intact.  IMPRESSION: 1. Right intertrochanteric fracture does appear to have a component which extends along the anterior basicervical region of the femoral neck and thus may have an intra-articular component. 2. Curving fluid density structure in the right posterior pelvis measuring 4.5 by 2.6 cm on image 5 series 5, possibly a hydrosalpinx, less likely an ovarian  cyst or bladder diverticulum. Consider further assessment with pelvic sonography.   Electronically Signed   By: Gaylyn Rong M.D.   On: 07/09/2015 16:49   Dg Hip Unilat With Pelvis 2-3 Views Right  07/09/2015   CLINICAL DATA:  Right hip pain after fall.  EXAM: DG HIP (WITH OR WITHOUT PELVIS) 2-3V RIGHT  COMPARISON:  None.  FINDINGS: There appears to be mildly displaced fracture overlying the trochanteric region which potentially may extend into the femoral neck. CT scan may be performed for further evaluation. No significant degenerative changes seen involving either hip joint.  IMPRESSION: Mildly displaced fracture is seen in the trochanteric region of the proximal right femur. CT scan of the hip is recommended to evaluate for possible involvement of the femoral neck.   Electronically Signed   By: Lupita Raider, M.D.   On: 07/09/2015 14:44    ROS:  Unable to obtain due to the patient's mental status/dementia.  Physical Exam: General: Awake and alert in no acute distress. HEENT: Atraumatic and normocephalic. Sclera are clear. Extraocular motion is intact. Oropharynx is clear with moist mucosa. Neck: Supple, nontender, good range of motion. No JVD or carotid bruits. Lungs: Clear to auscultation bilaterally. Cardiovascular: Regular rate and rhythm with normal S1 and S2. No murmurs. No gallops or rubs. Pedal pulses are palpable bilaterally. Homans test is negative bilaterally. No significant pretibial or ankle edema. Abdomen: Soft, nontender, and nondistended. Bowel sounds are present. Skin: No lesions in the area of chief complaint Neurologic: Awake, alert, but not oriented. Sensory function is grossly intact. Motor strength is felt to be 5 over 5 bilaterally. No clonus or tremor. Good motor coordination. Lymphatic: No axillary or cervical lymphadenopathy  MUSCULOSKELETAL: Examination of the right lower extremity shows no evidence of swelling to the knee or ankle. No knee effusion. No  tenderness to palpation about the ankle or lower leg. However, pain is elicited with gentle side to side rolling or rotation of the right hip. The patient is unable elevate the right leg off the bed and tends to maintain the hip in slight flexion. No gross shortening is appreciated.  Assessment: Right intertrochanteric femur fracture  Plan: An attempt was made to contact the DSS agent. I recommend open reduction and internal fixation of the right intertrochanteric femur fracture. The patient is unable to comprehend the risks and benefits of surgical intervention, and we will need to obtain consent from the Department of Social Services.  James P. Angie Fava M.D.

## 2015-07-10 ENCOUNTER — Inpatient Hospital Stay: Payer: Medicare Other | Admitting: Anesthesiology

## 2015-07-10 ENCOUNTER — Encounter
Admission: EM | Disposition: A | Discharge status: Skilled Nursing Facility | Payer: Self-pay | Source: Home / Self Care | Attending: Internal Medicine

## 2015-07-10 ENCOUNTER — Inpatient Hospital Stay: Payer: Medicare Other

## 2015-07-10 HISTORY — PX: INTRAMEDULLARY (IM) NAIL INTERTROCHANTERIC: SHX5875

## 2015-07-10 LAB — CBC
HEMATOCRIT: 35.3 % (ref 35.0–47.0)
HEMOGLOBIN: 11.8 g/dL — AB (ref 12.0–16.0)
MCH: 30.7 pg (ref 26.0–34.0)
MCHC: 33.3 g/dL (ref 32.0–36.0)
MCV: 92 fL (ref 80.0–100.0)
Platelets: 355 10*3/uL (ref 150–440)
RBC: 3.84 MIL/uL (ref 3.80–5.20)
RDW: 13.3 % (ref 11.5–14.5)
WBC: 17 10*3/uL — ABNORMAL HIGH (ref 3.6–11.0)

## 2015-07-10 LAB — BASIC METABOLIC PANEL
ANION GAP: 9 (ref 5–15)
BUN: 13 mg/dL (ref 6–20)
CHLORIDE: 98 mmol/L — AB (ref 101–111)
CO2: 28 mmol/L (ref 22–32)
Calcium: 8.6 mg/dL — ABNORMAL LOW (ref 8.9–10.3)
Creatinine, Ser: 0.78 mg/dL (ref 0.44–1.00)
GFR calc non Af Amer: >60 mL/min (ref >60)
GLUCOSE: 78 mg/dL (ref 65–99)
Potassium: 4.3 mmol/L (ref 3.5–5.1)
Sodium: 135 mmol/L (ref 135–145)

## 2015-07-10 LAB — GLUCOSE, CAPILLARY: GLUCOSE-CAPILLARY: 107 mg/dL — AB (ref 65–99)

## 2015-07-10 LAB — MRSA PCR SCREENING: MRSA by PCR: NEGATIVE

## 2015-07-10 SURGERY — FIXATION, FRACTURE, INTERTROCHANTERIC, WITH INTRAMEDULLARY ROD
Anesthesia: Choice | Site: Hip | Laterality: Right | Wound class: Clean

## 2015-07-10 MED ORDER — TRAMADOL HCL 50 MG PO TABS: 50.0000 mg | ORAL_TABLET | ORAL | Status: DC | PRN

## 2015-07-10 MED ORDER — ONDANSETRON HCL 4 MG/2ML IJ SOLN
4.0000 mg | Freq: Four times a day (QID) | INTRAMUSCULAR | Status: DC | PRN
  Administered 2015-07-11: 4 mg via INTRAVENOUS
  Filled 2015-07-10: qty 2

## 2015-07-10 MED ORDER — MIDAZOLAM HCL 5 MG/5ML IJ SOLN
INTRAMUSCULAR | Status: DC | PRN
  Administered 2015-07-10: 1 mg via INTRAVENOUS

## 2015-07-10 MED ORDER — FERROUS SULFATE 325 (65 FE) MG PO TABS
325.0000 mg | ORAL_TABLET | Freq: Two times a day (BID) | ORAL | Status: DC
Start: 2015-07-11 — End: 2015-07-13
  Administered 2015-07-11 – 2015-07-13 (×4): 325 mg via ORAL
  Filled 2015-07-10 (×5): qty 1

## 2015-07-10 MED ORDER — SENNOSIDES-DOCUSATE SODIUM 8.6-50 MG PO TABS
1.0000 | ORAL_TABLET | Freq: Two times a day (BID) | ORAL | Status: DC
  Administered 2015-07-11 – 2015-07-13 (×4): 1 via ORAL
  Filled 2015-07-10 (×6): qty 1

## 2015-07-10 MED ORDER — ONDANSETRON HCL 4 MG/2ML IJ SOLN
4.0000 mg | Freq: Four times a day (QID) | INTRAMUSCULAR | Status: DC | PRN
  Administered 2015-07-10: 4 mg via INTRAVENOUS
  Filled 2015-07-10: qty 2

## 2015-07-10 MED ORDER — NEOMYCIN-POLYMYXIN B GU 40-200000 IR SOLN
Status: DC | PRN
  Administered 2015-07-10: 4 mL

## 2015-07-10 MED ORDER — PHENYLEPHRINE HCL 10 MG/ML IJ SOLN
INTRAMUSCULAR | Status: DC | PRN
  Administered 2015-07-10 (×5): 100 ug via INTRAVENOUS

## 2015-07-10 MED ORDER — MAGNESIUM HYDROXIDE 400 MG/5ML PO SUSP
30.0000 mL | Freq: Every day | ORAL | Status: DC | PRN
  Administered 2015-07-11: 30 mL via ORAL
  Filled 2015-07-10: qty 30

## 2015-07-10 MED ORDER — ACETAMINOPHEN 325 MG PO TABS
650.0000 mg | ORAL_TABLET | Freq: Four times a day (QID) | ORAL | Status: DC | PRN
  Filled 2015-07-10: qty 2

## 2015-07-10 MED ORDER — ROPIVACAINE HCL 5 MG/ML IJ SOLN
INTRAMUSCULAR | Status: DC | PRN
  Administered 2015-07-10: 3 mL via EPIDURAL

## 2015-07-10 MED ORDER — PHENOL 1.4 % MT LIQD: 1.0000 | OROMUCOSAL | Status: DC | PRN

## 2015-07-10 MED ORDER — ACETAMINOPHEN 10 MG/ML IV SOLN
INTRAVENOUS | Status: AC
  Filled 2015-07-10: qty 100

## 2015-07-10 MED ORDER — ENOXAPARIN SODIUM 30 MG/0.3ML ~~LOC~~ SOLN
30.0000 mg | SUBCUTANEOUS | Status: DC
  Administered 2015-07-11 – 2015-07-13 (×3): 30 mg via SUBCUTANEOUS
  Filled 2015-07-10 (×3): qty 0.3

## 2015-07-10 MED ORDER — OXYCODONE HCL 5 MG PO TABS
5.0000 mg | ORAL_TABLET | ORAL | Status: DC | PRN
  Administered 2015-07-12: 5 mg via ORAL
  Filled 2015-07-10: qty 1

## 2015-07-10 MED ORDER — FLEET ENEMA 7-19 GM/118ML RE ENEM: 1.0000 | ENEMA | Freq: Once | RECTAL | Status: DC | PRN

## 2015-07-10 MED ORDER — SODIUM CHLORIDE 0.9 % IV SOLN
INTRAVENOUS | Status: DC
  Administered 2015-07-11 (×2): via INTRAVENOUS

## 2015-07-10 MED ORDER — ACETAMINOPHEN 10 MG/ML IV SOLN
1000.0000 mg | Freq: Four times a day (QID) | INTRAVENOUS | Status: AC
  Administered 2015-07-11 (×3): 1000 mg via INTRAVENOUS
  Filled 2015-07-10 (×4): qty 100

## 2015-07-10 MED ORDER — CEFAZOLIN SODIUM-DEXTROSE 2-3 GM-% IV SOLR
2.0000 g | Freq: Four times a day (QID) | INTRAVENOUS | Status: AC
  Administered 2015-07-10 – 2015-07-11 (×4): 2 g via INTRAVENOUS
  Filled 2015-07-10 (×4): qty 50

## 2015-07-10 MED ORDER — FENTANYL CITRATE (PF) 100 MCG/2ML IJ SOLN
INTRAMUSCULAR | Status: DC | PRN
  Administered 2015-07-10: 50 ug via INTRAVENOUS

## 2015-07-10 MED ORDER — ACETAMINOPHEN 650 MG RE SUPP: 650.0000 mg | Freq: Four times a day (QID) | RECTAL | Status: DC | PRN

## 2015-07-10 MED ORDER — IPRATROPIUM-ALBUTEROL 0.5-2.5 (3) MG/3ML IN SOLN
3.0000 mL | Freq: Once | RESPIRATORY_TRACT | Status: AC
  Administered 2015-07-10: 3 mL via RESPIRATORY_TRACT

## 2015-07-10 MED ORDER — MORPHINE SULFATE (PF) 2 MG/ML IV SOLN
2.0000 mg | INTRAVENOUS | Status: DC | PRN
  Administered 2015-07-11: 2 mg via INTRAVENOUS
  Filled 2015-07-10: qty 1

## 2015-07-10 MED ORDER — INSULIN ASPART 100 UNIT/ML ~~LOC~~ SOLN
0.0000 [IU] | Freq: Three times a day (TID) | SUBCUTANEOUS | Status: DC
  Administered 2015-07-11: 2 [IU] via SUBCUTANEOUS
  Administered 2015-07-11: 5 [IU] via SUBCUTANEOUS
  Administered 2015-07-12: 1 [IU] via SUBCUTANEOUS
  Administered 2015-07-12: 2 [IU] via SUBCUTANEOUS
  Filled 2015-07-10: qty 4
  Filled 2015-07-10: qty 2
  Filled 2015-07-10: qty 1

## 2015-07-10 MED ORDER — ONDANSETRON HCL 4 MG/2ML IJ SOLN: 4.0000 mg | Freq: Once | INTRAMUSCULAR | Status: DC | PRN

## 2015-07-10 MED ORDER — INSULIN ASPART 100 UNIT/ML ~~LOC~~ SOLN
0.0000 [IU] | Freq: Every day | SUBCUTANEOUS | Status: DC
  Administered 2015-07-11: 2 [IU] via SUBCUTANEOUS
  Filled 2015-07-10: qty 4
  Filled 2015-07-10: qty 5

## 2015-07-10 MED ORDER — NEOMYCIN-POLYMYXIN B GU 40-200000 IR SOLN
Status: AC
  Filled 2015-07-10: qty 4

## 2015-07-10 MED ORDER — PANTOPRAZOLE SODIUM 40 MG PO TBEC
40.0000 mg | DELAYED_RELEASE_TABLET | Freq: Two times a day (BID) | ORAL | Status: DC
  Administered 2015-07-11 – 2015-07-13 (×4): 40 mg via ORAL
  Filled 2015-07-10 (×4): qty 1

## 2015-07-10 MED ORDER — FENTANYL CITRATE (PF) 100 MCG/2ML IJ SOLN: 25.0000 ug | INTRAMUSCULAR | Status: DC | PRN

## 2015-07-10 MED ORDER — ONDANSETRON HCL 4 MG PO TABS
4.0000 mg | ORAL_TABLET | Freq: Four times a day (QID) | ORAL | Status: DC | PRN
  Administered 2015-07-11: 4 mg via ORAL
  Filled 2015-07-10: qty 1

## 2015-07-10 MED ORDER — MENTHOL 3 MG MT LOZG: 1.0000 | LOZENGE | OROMUCOSAL | Status: DC | PRN

## 2015-07-10 MED ORDER — IPRATROPIUM-ALBUTEROL 0.5-2.5 (3) MG/3ML IN SOLN
RESPIRATORY_TRACT | Status: AC
  Administered 2015-07-10: 3 mL via RESPIRATORY_TRACT
  Filled 2015-07-10: qty 3

## 2015-07-10 MED ORDER — METOCLOPRAMIDE HCL 10 MG PO TABS
10.0000 mg | ORAL_TABLET | Freq: Three times a day (TID) | ORAL | Status: AC
  Administered 2015-07-11 – 2015-07-12 (×7): 10 mg via ORAL
  Filled 2015-07-10 (×8): qty 1

## 2015-07-10 MED ORDER — METOCLOPRAMIDE HCL 5 MG/ML IJ SOLN
5.0000 mg | Freq: Three times a day (TID) | INTRAMUSCULAR | Status: DC | PRN
  Administered 2015-07-11: 10 mg via INTRAVENOUS
  Filled 2015-07-10 (×2): qty 2

## 2015-07-10 MED ORDER — BISACODYL 10 MG RE SUPP
10.0000 mg | Freq: Every day | RECTAL | Status: DC | PRN
  Administered 2015-07-11: 10 mg via RECTAL
  Filled 2015-07-10: qty 1

## 2015-07-10 MED ORDER — PROPOFOL 500 MG/50ML IV EMUL
INTRAVENOUS | Status: DC | PRN
  Administered 2015-07-10: 20 ug/kg/min via INTRAVENOUS

## 2015-07-10 MED ORDER — METOCLOPRAMIDE HCL 5 MG PO TABS: 5.0000 mg | ORAL_TABLET | Freq: Three times a day (TID) | ORAL | Status: DC | PRN

## 2015-07-10 MED ORDER — INFLUENZA VAC SPLIT QUAD 0.5 ML IM SUSY: 0.5000 mL | PREFILLED_SYRINGE | INTRAMUSCULAR | Status: DC

## 2015-07-10 SURGICAL SUPPLY — 33 items
BNDG COHESIVE 6X5 TAN STRL LF (GAUZE/BANDAGES/DRESSINGS) ×3 IMPLANT
CANISTER SUCT 1200ML W/VALVE (MISCELLANEOUS) ×3 IMPLANT
DRAPE C-ARMOR (DRAPES) IMPLANT
DRAPE SHEET LG 3/4 BI-LAMINATE (DRAPES) IMPLANT
DRAPE TABLE BACK 80X90 (DRAPES) ×3 IMPLANT
DRSG DERMACEA 8X12 NADH (GAUZE/BANDAGES/DRESSINGS) ×3 IMPLANT
DRSG OPSITE POSTOP 4X10 (GAUZE/BANDAGES/DRESSINGS) ×3 IMPLANT
DURAPREP 26ML APPLICATOR (WOUND CARE) ×3 IMPLANT
GAUZE SPONGE 4X4 12PLY STRL (GAUZE/BANDAGES/DRESSINGS) ×3 IMPLANT
GLOVE BIO SURGEON STRL SZ7.5 (GLOVE) ×3 IMPLANT
GLOVE BIO SURGEON STRL SZ8 (GLOVE) ×3 IMPLANT
GLOVE BIOGEL M STRL SZ7.5 (GLOVE) ×3 IMPLANT
GLOVE INDICATOR 8.0 STRL GRN (GLOVE) ×3 IMPLANT
GLOVE SURG XRAY 8.5 LX (GLOVE) ×3 IMPLANT
GOWN STRL REUS W/ TWL LRG LVL4 (GOWN DISPOSABLE) ×2 IMPLANT
GOWN STRL REUS W/TWL LRG LVL4 (GOWN DISPOSABLE) ×4
KIT RM TURNOVER CYSTO AR (KITS) ×3 IMPLANT
MAT BLUE FLOOR 46X72 FLO (MISCELLANEOUS) ×3 IMPLANT
NAIL TROCH FX 11/130D 170-S (Nail) ×3 IMPLANT
NS IRRIG 1000ML POUR BTL (IV SOLUTION) ×3 IMPLANT
PACK HIP COMPR (MISCELLANEOUS) ×3 IMPLANT
PAD GROUND ADULT SPLIT (MISCELLANEOUS) ×3 IMPLANT
REAMER ROD DEEP FLUTE 2.5X950 (INSTRUMENTS) ×3 IMPLANT
SCREW LOCK TI 5.0X36 F/IM NAIL (Screw) ×3 IMPLANT
SOL PREP PVP 2OZ (MISCELLANEOUS) ×3
SOLUTION PREP PVP 2OZ (MISCELLANEOUS) ×1 IMPLANT
STAPLER SKIN PROX 35W (STAPLE) ×3 IMPLANT
SUCTION FRAZIER TIP 10 FR DISP (SUCTIONS) ×6 IMPLANT
SUT VIC AB 0 CT1 36 (SUTURE) ×3 IMPLANT
SUT VIC AB 1 CT1 36 (SUTURE) ×3 IMPLANT
SUT VIC AB 2-0 CT1 27 (SUTURE) ×2
SUT VIC AB 2-0 CT1 TAPERPNT 27 (SUTURE) ×1 IMPLANT
TAPE MICROFOAM 4IN (TAPE) ×3 IMPLANT

## 2015-07-10 NOTE — Anesthesia Procedure Notes (Signed)
Spinal Patient location during procedure: OR Start time: 07/10/2015 7:08 PM End time: 07/10/2015 7:22 PM Staffing Performed by: anesthesiologist  Preanesthetic Checklist Completed: patient identified, site marked, surgical consent, pre-op evaluation, timeout performed, IV checked, risks and benefits discussed and monitors and equipment checked Spinal Block Patient position: right lateral decubitus Prep: Betadine Patient monitoring: heart rate, cardiac monitor, continuous pulse ox and blood pressure Approach: midline Location: L4-5 Injection technique: single-shot Needle Needle type: Whitacre  Needle gauge: 25 G Needle length: 5 cm Needle insertion depth: 4 cm

## 2015-07-10 NOTE — Transfer of Care (Signed)
Immediate Anesthesia Transfer of Care Note  Patient: Dana HuddleJosephine E Riddle  Procedure(s) Performed: Procedure(s): INTRAMEDULLARY (IM) NAIL INTERTROCHANTRIC (Right)  Patient Location: PACU  Anesthesia Type:Spinal  Level of Consciousness: awake and patient cooperative  Airway & Oxygen Therapy: Patient Spontanous Breathing and Patient connected to nasal cannula oxygen  Post-op Assessment: Report given to RN and Post -op Vital signs reviewed and stable  Post vital signs: Reviewed and stable  Last Vitals:  Filed Vitals:   07/10/15 1747  BP: 101/58  Pulse: 95  Temp:   Resp: 24    Complications: No apparent anesthesia complications

## 2015-07-10 NOTE — Clinical Social Work Note (Signed)
Clinical Social Work Assessment  Patient Details  Name: Dana HuddleJosephine E Riddle MRN: [id number] Date of Birth: [birth year]  Date of referral:  07/10/15               Reason for consult:  Facility Placement, Other (Comment Required) ([facility] )                Permission sought to share information with:  Oceanographeracility Contact Representative Permission granted to share information::  Yes, Verbal Permission Granted  Name::      Skilled Nursing Facility   Agency::   [place]   Relationship::     Contact Information:     Housing/Transportation Living arrangements for the past 2 months:  Assisted Living Facility Source of Information:  Guardian (DSS Guardian ) Patient Interpreter Needed:  None Criminal Activity/Legal Involvement Pertinent to Current Situation/Hospitalization:  No - Comment as needed Significant Relationships:  Other Family Members, Other(Comment) (DSS Guardian ) Lives with:  Facility Resident Do you feel safe going back to the place where you live?  Yes Need for family participation in patient care:  Yes (Comment)  Care giving concerns:  [facility].    Social Worker assessment / plan: Visual merchandiserClinical Social Worker (CSW) received verbal consult from RN that [facility] and has a DSS guardian. CSW contacted patient's [facility] [place] Department of Social Services [place] Kay [phone]. Per Joyce GrossKay patient has no restrictions on visitation and her niece Larene BeachVickie is very involved and will likely visit. CSW explained that DSS will have to give consent for surgery. Per Arvella MerlesKay Adrian her supervisor will call RN and give consent for surgery. CSW explained to guardian that patient will likely need rehab after surgery. Guardian is agreeable to SNF search and [facility]. CSW will continue to follow and assist as needed.   Employment status:  Disabled (Comment on whether  or not currently receiving Disability) Insurance information:  Medic[place], [place] Recommend[place]:  Not assessed at this time Information / Referral to community resources:  Skilled Nursing Facility  Patient/Family's Response to care: Guardian is agreeable to SN[place] search in [place].   Patient/Family's Understanding of and Emotional Response to Diagnosis, Current Treatment, and Prognosis: Guardian thanked CSW for calling and assisting with placement.   Emotional Assessment Appearance:  Appears stated age Attitude/Demeanor/Rapport:    Affect (typically observed):  Unable to Assess Orientation:  Fluctuating Orientation (Suspected and/or reported Sundowners) Alcohol / Substance use:  Not Applicable Psych involvement (Current and /or in the community):  No (Comment)  Discharge Needs  Concerns to be addressed:  Discharge Planning Concerns Readmission within the last 30 days:  No Current discharge risk:  Dependent with Mobility Barriers to Discharge:  Continued Medical Work up   Haig ProphetMorgan, Yisroel Mullendore G, LCSW 07/10/2015, 4:00 PM

## 2015-07-10 NOTE — Anesthesia Preprocedure Evaluation (Addendum)
Anesthesia Evaluation  Patient identified by MRN, date of birth, ID band Patient confused    Reviewed: Allergy & Precautions, NPO status , Patient's Chart, lab work & pertinent test results, Unable to perform ROS - Chart review only  History of Anesthesia Complications Negative for: history of anesthetic complications  Airway Mallampati: III       Dental  (+) Missing, Loose, Chipped, Poor Dentition   Pulmonary Current Smoker,    + rhonchi        Cardiovascular hypertension, Pt. on medications      Neuro/Psych PSYCHIATRIC DISORDERS (dementia)    GI/Hepatic   Endo/Other  diabetes, Type 2, Oral Hypoglycemic Agents, Insulin Dependent  Renal/GU Renal Insufficiency     Musculoskeletal   Abdominal   Peds  Hematology   Anesthesia Other Findings   Reproductive/Obstetrics                          Anesthesia Physical Anesthesia Plan  ASA: III  Anesthesia Plan: Spinal   Post-op Pain Management:    Induction:   Airway Management Planned:   Additional Equipment:   Intra-op Plan:   Post-operative Plan:   Informed Consent: I have reviewed the patients History and Physical, chart, labs and discussed the procedure including the risks, benefits and alternatives for the proposed anesthesia with the patient or authorized representative who has indicated his/her understanding and acceptance.     Plan Discussed with:   Anesthesia Plan Comments:         Anesthesia Quick Evaluation

## 2015-07-10 NOTE — Op Note (Signed)
OPERATIVE NOTE  DATE OF SURGERY:  07/09/2015 - 07/10/2015  PATIENT NAME:  Dana Riddle   DOB: 07-24-1929  MRN: [id number]  PRE-OPERATIVE DIAGNOSIS: Right intertrochanteric femur fracture  POST-OPERATIVE DIAGNOSIS:  Same  PROCEDURE: Open reduction and internal fixation of a right intertrochanteric femur fracture   SURGEON:  Jena Gauss. M.D.  ANESTHESIA: spinal  ESTIMATED BLOOD LOSS: 20 mL  FLUIDS REPLACED: 500 mL of crystalloid  DRAINS: None  IMPLANTS UTILIZED: Synthes 11 mm/130 trochanteric fixation nail, 85 mm helical blade, 36 mm x 5.0 mm locking screw  INDICATIONS FOR SURGERY: TANZANIA BASHAM is a 81 y.o. year old female who fell and sustained a right intertrochanteric femur fracture. After discussion of the risks and benefits of surgical intervention, the patient's guardian expressed understanding of the risks benefits and agree with plans for open reduction and internal fixation.   The risks, benefits, and alternatives were discussed at length including but not limited to the risks of infection, bleeding, nerve injury, stiffness, blood clots, the need for revision surgery, limb length inequality, cardiopulmonary complications, among others, and they were willing to proceed.  PROCEDURE IN DETAIL: The patient was brought into the operating room and, after adequate spinal anesthesia was achieved, patient was placed on the fracture table. All bony prominences were well-padded. The right lower extremity was placed in traction and a provisional reduction was performed and verified using the C-arm. The patient's right hip and leg were cleaned and prepped with alcohol and DuraPrep and draped in the usual sterile fashion. A "timeout" was performed as per usual protocol. A lateral incision was made extended from the proximal portion of the greater trochanter proximally. The fascia was incised in line with the skin incision and the fibers of the hip abductors were split in line.  The tip of the greater trochanter was palpated and a distally threaded guide pin was inserted into the tip of the greater trochanter and advanced into the medullary canal. Position was confirmed in both AP and lateral planes using the C-arm. A pilot hole was enlarged using a step drill. A Synthes 11 mm/130 trochanteric fixation nail was advanced over the guidepin and position confirmed using the C-arm. A second stab incision was made and the tissue protector was inserted through the outrigger device and advanced to the lateral cortex of the femur. A threaded screw guide pin was inserted into the femoral neck and head and position was again confirmed in both AP and lateral planes. Vision was were obtained and it was felt that a 85 mm helical blade was appropriate. The cortex was reamed and then a cannulated reamer was advanced over the guidepin to the appropriate depth. A 85 mm helical blade was then advanced over the guidepin and impacted into place. Good position was noted in multiple planes using the C-arm. The locking sleeve was engaged. Finally, a third stab incision was made and the tissue protector was inserted through the outrigger device and advanced the lateral cortex of the femur for placement of the distal locking screw. A 36 mm x 5.0 mm locking screw was then inserted. The outrigger device was removed. The hip was visualized in all planes using the C-arm with good reduction appreciated and good position of the hardware noted.  The wound was irrigated with copious amounts of normal saline with antibiotic solution and suctioned dry. Good hemostasis was appreciated. The fascia was reapproximated using interrupted sutures of #1 Vicryl. Subcutaneous tissue was approximated layers using first #0 Vicryl followed #  2-0 Vicryl. The skin was closed with skin staples. A sterile dressing was applied.  The patient tolerated the procedure well and was transported to the recovery room in stable condition.   Jena GaussJames  P Aayushi Solorzano, Jr., M.D.

## 2015-07-10 NOTE — Progress Notes (Signed)
[facility] - [place] at [facility]   PATIENT NAME: Dana Riddle    MR#:  [id number]  DATE OF BIRTH:  July 28, 1929  SUBJECTIVE:  CHIEF COMPLAINT:   Chief Complaint  Patient presents with  . Fall   - Patient admitted with fall and right hip fracture. Has significant dementia. Not oriented at all. -For surgery later today. -Complaints of nausea earlier, improved with Zofran  REVIEW OF SYSTEMS:  Review of Systems  Unable to perform ROS: dementia    DRUG ALLERGIES:  No Known Allergies  VITALS:  Blood pressure 95/50, pulse 87, temperature 97.9 F (36.6 C), temperature source Oral, resp. rate 19, weight 47.628 kg (105 lb), SpO2 91 %.  PHYSICAL EXAMINATION:  Physical Exam  GENERAL:  81 y.o.-year-old, malnourished appearing, patient lying in the bed with no acute distress.  EYES: Pupils equal, round, reactive to light and accommodation. No scleral icterus. Extraocular muscles intact.  HEENT: Head atraumatic, normocephalic. Oropharynx and nasopharynx clear.  NECK:  Supple, no jugular venous distention. No thyroid enlargement, no tenderness.  LUNGS: Normal breath sounds bilaterally, no wheezing, rales,rhonchi or crepitation. No use of accessory muscles of respiration. Decreased bibasilar breath sounds CARDIOVASCULAR: S1, S2 normal. No  rubs, or gallops. 3/6 systolic murmur present ABDOMEN: Soft, nontender, nondistended. Bowel sounds present. No organomegaly or mass.  EXTREMITIES: No pedal edema, cyanosis, or clubbing.  NEUROLOGIC: Unable to do a complete neuro exam due to dementia and noncooperation. Able to move both upper extremities in bed. Regarding right lower extremity due to pain likely  PSYCHIATRIC: The patient is alert but not oriented SKIN: No obvious rash, lesion, or ulcer.    LABORATORY PANEL:   CBC  Recent Labs Lab 07/10/15 0604  WBC 17.0*  HGB 11.8*  HCT 35.3  PLT 355    ------------------------------------------------------------------------------------------------------------------  Chemistries   Recent Labs Lab 07/09/15 1336 07/10/15 0604  NA 131* 135  K 4.1 4.3  CL 98* 98*  CO2 28 28  GLUCOSE 118* 78  BUN 11 13  CREATININE 0.97 0.78  CALCIUM 8.3* 8.6*  AST 17  --   ALT 12*  --   ALKPHOS 83  --   BILITOT 0.4  --    ------------------------------------------------------------------------------------------------------------------  Cardiac Enzymes No results for input(s): TROPONINI in the last 168 hours. ------------------------------------------------------------------------------------------------------------------  RADIOLOGY:  Dg Chest 1 View  07/09/2015  CLINICAL DATA:  Unwitnessed fall, right hip pain EXAM: CHEST 1 VIEW COMPARISON:  05/31/2013 FINDINGS: There are low lung volumes with crowding of the interstitial markings. There is no focal parenchymal opacity. There is no pleural effusion or pneumothorax. The heart and mediastinal contours are unremarkable. The osseous structures are unremarkable. IMPRESSION: No active disease. Electronically Signed   By: Elige Ko   On: 07/09/2015 14:44   Ct Head Wo Contrast  07/09/2015  CLINICAL DATA:  Unwitnessed fall, initial encounter EXAM: CT HEAD WITHOUT CONTRAST CT CERVICAL SPINE WITHOUT CONTRAST TECHNIQUE: Multidetector CT imaging of the head and cervical spine was performed following the standard protocol without intravenous contrast. Multiplanar CT image reconstructions of the cervical spine were also generated. COMPARISON:  03/27/2015 FINDINGS: CT HEAD FINDINGS The bony calvarium is intact. No gross soft tissue abnormality is seen. Mucosal thickening is noted within the ethmoid and maxillary sinuses significantly increased from the prior exam. There are atrophic changes and chronic white matter ischemic changes identified. These are similar to that seen on the prior exam. No findings to  suggest acute hemorrhage, acute infarction or space-occupying mass lesion  are noted. CT CERVICAL SPINE FINDINGS Seven cervical segments are well visualized. Vertebral body height is well maintained. Disc space narrowing is noted from C3 to C6 with associated osteophytic changes. Multilevel facet hypertrophic changes are seen. No acute fracture or acute facet abnormality is noted. The surrounding soft tissue structures show improvement in the skin thickening seen in the posterior right neck on the prior exam. No acute soft tissue abnormality is noted. The visualized lung apices are within normal limits. IMPRESSION: CT of the head: Stable atrophic in chronic white matter ischemic changes. No acute abnormality noted. CT of the cervical spine: Degenerative changes without acute abnormality. Electronically Signed   By: Alcide CleverMark  Lukens M.D.   On: 07/09/2015 14:43   Ct Cervical Spine Wo Contrast  07/09/2015  CLINICAL DATA:  Unwitnessed fall, initial encounter EXAM: CT HEAD WITHOUT CONTRAST CT CERVICAL SPINE WITHOUT CONTRAST TECHNIQUE: Multidetector CT imaging of the head and cervical spine was performed following the standard protocol without intravenous contrast. Multiplanar CT image reconstructions of the cervical spine were also generated. COMPARISON:  03/27/2015 FINDINGS: CT HEAD FINDINGS The bony calvarium is intact. No gross soft tissue abnormality is seen. Mucosal thickening is noted within the ethmoid and maxillary sinuses significantly increased from the prior exam. There are atrophic changes and chronic white matter ischemic changes identified. These are similar to that seen on the prior exam. No findings to suggest acute hemorrhage, acute infarction or space-occupying mass lesion are noted. CT CERVICAL SPINE FINDINGS Seven cervical segments are well visualized. Vertebral body height is well maintained. Disc space narrowing is noted from C3 to C6 with associated osteophytic changes. Multilevel facet hypertrophic  changes are seen. No acute fracture or acute facet abnormality is noted. The surrounding soft tissue structures show improvement in the skin thickening seen in the posterior right neck on the prior exam. No acute soft tissue abnormality is noted. The visualized lung apices are within normal limits. IMPRESSION: CT of the head: Stable atrophic in chronic white matter ischemic changes. No acute abnormality noted. CT of the cervical spine: Degenerative changes without acute abnormality. Electronically Signed   By: Alcide CleverMark  Lukens M.D.   On: 07/09/2015 14:43   Ct Hip Right Wo Contrast  07/09/2015  CLINICAL DATA:  Unwitnessed fall.  Hip pain.  Fracture. EXAM: CT OF THE RIGHT HIP WITHOUT CONTRAST TECHNIQUE: Multidetector CT imaging of the right hip was performed according to the standard protocol. Multiplanar CT image reconstructions were also generated. COMPARISON:  07/09/2015 FINDINGS: Intertrochanteric fracture of the right femur does appear to have a component which extends along the basicervical region of the right femoral neck anteriorly, for example on image 28 of series 6 and image 33 series 2, and this may have an intra-articular component. There is some degenerative articular space narrowing in the right hip. Bony demineralization. Aortoiliac atherosclerotic vascular disease. Fluid density lesion along the right posterior pelvis could reflect a hydrosalpinx or cystic lesion of the right ovary. There is spurring of the right acetabulum but the acetabulum is otherwise intact. IMPRESSION: 1. Right intertrochanteric fracture does appear to have a component which extends along the anterior basicervical region of the femoral neck and thus may have an intra-articular component. 2. Curving fluid density structure in the right posterior pelvis measuring 4.5 by 2.6 cm on image 5 series 5, possibly a hydrosalpinx, less likely an ovarian cyst or bladder diverticulum. Consider further assessment with pelvic sonography.  Electronically Signed   By: Gaylyn RongWalter  Liebkemann M.D.   On: 07/09/2015 16:49  Dg Hip Unilat With Pelvis 2-3 Views Right  07/09/2015  CLINICAL DATA:  Right hip pain after fall. EXAM: DG HIP (WITH OR WITHOUT PELVIS) 2-3V RIGHT COMPARISON:  None. FINDINGS: There appears to be mildly displaced fracture overlying the trochanteric region which potentially may extend into the femoral neck. CT scan may be performed for further evaluation. No significant degenerative changes seen involving either hip joint. IMPRESSION: Mildly displaced fracture is seen in the trochanteric region of the proximal right femur. CT scan of the hip is recommended to evaluate for possible involvement of the femoral neck. Electronically Signed   By: Lupita Raider, M.D.   On: 07/09/2015 14:44    EKG:   Orders placed or performed during the hospital encounter of 07/09/15  . EKG 12-Lead  . EKG 12-Lead    ASSESSMENT AND PLAN:   81 year old female with past medical history significant for Alzheimer's dementia, CK D, hypertension and diabetes from [place] years assisted living facility brought in after a fall and right hip fracture.  #1 right hip fracture-secondary to mechanical fall -Appreciate orthopedics consult. Consent taken from DSS agent Exline-for surgery ORIF today -Pain management, physical therapy postop, DVT prophylaxis postop recommended -Monitor hemoglobin after surgery.  #2 leukocytosis-no evidence of infection noted. -Stress reaction from. Fracture -Continue to monitor at this time. Urine analysis is normal.  #3 diabetes mellitus-patient on Januvia and empagliflozin as outpatient. Currently nothing by mouth for surgery. We'll just do sliding scale insulin for now.  #4 depression and anxiety-on Remeron and Klonopin. Will continue that.  #5 DVT prophylaxis-will be started after surgery.   All the records are reviewed and case discussed with Care Management/Social Workerr. Management plans discussed with the  patient, family and they are in agreement.  CODE STATUS: Full code  TOTAL TIME TAKING CARE OF THIS PATIENT: 37 minutes.   POSSIBLE D/C IN 3-4 DAYS, DEPENDING ON CLINICAL CONDITION.   Enid Baas M.D on 07/10/2015 at 2:50 PM  Between 7am to 6pm - Pager - [phone]  After 6pm go to [email or web address] - password [email or web address] [facility]  [place] [facility]  Office  [phone]  CC: Primary care physician; Vonita Moss, MD

## 2015-07-10 NOTE — Brief Op Note (Signed)
07/09/2015 - 07/10/2015  9:12 PM  PATIENT:  Dana Riddle  81 y.o. female  PRE-OPERATIVE DIAGNOSIS:  right intertrochanteric femur fx  POST-OPERATIVE DIAGNOSIS:  right intertrochanteric femur fracture  PROCEDURE:  ORIF right Intertrochanteric femur fracture   SURGEON:  Surgeon(s) and Role:    * Donato HeinzJames P Payson Evrard, MD - Primary  ASSISTANTS: none   ANESTHESIA:   spinal  EBL:  Total I/O In: 500 [I.V.:500] Out: 220 [Urine:200; Blood:20]  BLOOD ADMINISTERED:none  DRAINS: none   LOCAL MEDICATIONS USED:  NONE  SPECIMEN:  No Specimen  DISPOSITION OF SPECIMEN:  N/A  COUNTS:  YES  TOURNIQUET:  * No tourniquets in log *  DICTATION: .Dragon Dictation  PLAN OF CARE: Admit to inpatient   PATIENT DISPOSITION:  PACU - hemodynamically stable.   Delay start of Pharmacological VTE agent (>24hrs) due to surgical blood loss or risk of bleeding: yes

## 2015-07-10 NOTE — OR Nursing (Signed)
Patient waiting in PACU pre-operatively.  Gold watch and 2 gold rings removed from patient's left hand and arm.  Tiffany from orthopedics picked up patient's jewelry

## 2015-07-10 NOTE — Progress Notes (Signed)
Deputy Director of [facility] social services called to get informed consent for surgery via telephone. Blood consent was also received via telephone consent. Consents are in chart. Surgery is scheduled for around 5pm. Patient resting in bed, pain controlled. NPO. Continue to monitor.

## 2015-07-10 NOTE — Clinical Social Work Placement (Signed)
   CLINICAL SOCIAL WORK PLACEMENT  NOTE  Date:  07/10/2015  Patient Details  Name: Dana Riddle MRN: [id number] Date of Birth: 1928-12-16  Clinical Social Work is seeking post-discharge placement for this patient at the Skilled  Nursing Facility level of care (*CSW will initial, date and re-position this form in  chart as items are completed):  Yes   Patient/family provided with [place] Clinical Social Work Department's list of facilities offering this level of care within the geographic area requested by the patient (or if unable, by the patient's family).  Yes   Patient/family informed of their freedom to choose among providers that offer the needed level of care, that participate in Medicare, Medicaid or managed care program needed by the patient, have an available bed and are willing to accept the patient.  Yes   Patient/family informed of [facility]'s ownership interest in [facility] and [facility], as well as of the fact that they are under no obligation to receive care at these facilities.  PASRR submitted to EDS on 07/10/15     PASRR number received on       Existing PASRR number confirmed on 07/10/15 (Patient has a level 2 [facility] PASARR)     FL2 transmitted to all facilities in geographic area requested by pt/family on 07/10/15     FL2 transmitted to all facilities within larger geographic area on       Patient informed that his/her managed care company has contracts with or will negotiate with certain facilities, including the following:            Patient/family informed of bed offers received.  Patient chooses bed at       Physician recommends and patient chooses bed at      Patient to be transferred to   on  .  Patient to be transferred to facility by       Patient family notified on   of transfer.  Name of family member notified:        PHYSICIAN Please sign FL2     Additional Comment:     _______________________________________________ Haig ProphetMorgan, Riven Beebe G, LCSW 07/10/2015, 4:54 PM

## 2015-07-11 ENCOUNTER — Encounter: Payer: Self-pay | Admitting: Orthopedic Surgery

## 2015-07-11 ENCOUNTER — Inpatient Hospital Stay: Payer: Medicare Other

## 2015-07-11 LAB — BASIC METABOLIC PANEL
ANION GAP: 11 (ref 5–15)
BUN: 20 mg/dL (ref 6–20)
CHLORIDE: 99 mmol/L — AB (ref 101–111)
CO2: 23 mmol/L (ref 22–32)
Calcium: 7.8 mg/dL — ABNORMAL LOW (ref 8.9–10.3)
Creatinine, Ser: 1.13 mg/dL — ABNORMAL HIGH (ref 0.44–1.00)
GFR calc non Af Amer: 43 mL/min — ABNORMAL LOW (ref >60)
GFR, EST AFRICAN AMERICAN: 50 mL/min — AB (ref >60)
GLUCOSE: 118 mg/dL — AB (ref 65–99)
Potassium: 4.4 mmol/L (ref 3.5–5.1)
Sodium: 133 mmol/L — ABNORMAL LOW (ref 135–145)

## 2015-07-11 LAB — URINALYSIS COMPLETE WITH MICROSCOPIC (ARMC ONLY)
BILIRUBIN URINE: NEGATIVE
Glucose, UA: >500 mg/dL — AB
Hgb urine dipstick: NEGATIVE
Leukocytes, UA: NEGATIVE
NITRITE: NEGATIVE
PH: 5 (ref 5.0–8.0)
Protein, ur: NEGATIVE mg/dL
Specific Gravity, Urine: 1.026 (ref 1.005–1.030)

## 2015-07-11 LAB — GLUCOSE, CAPILLARY
GLUCOSE-CAPILLARY: 150 mg/dL — AB (ref 65–99)
GLUCOSE-CAPILLARY: 329 mg/dL — AB (ref 65–99)
Glucose-Capillary: 136 mg/dL — ABNORMAL HIGH (ref 65–99)
Glucose-Capillary: 279 mg/dL — ABNORMAL HIGH (ref 65–99)
Glucose-Capillary: 233 mg/dL — ABNORMAL HIGH (ref 65–99)

## 2015-07-11 LAB — CBC
HEMATOCRIT: 34.9 % — AB (ref 35.0–47.0)
HEMOGLOBIN: 11.1 g/dL — AB (ref 12.0–16.0)
MCH: 29.6 pg (ref 26.0–34.0)
MCHC: 31.9 g/dL — AB (ref 32.0–36.0)
MCV: 92.8 fL (ref 80.0–100.0)
Platelets: 327 10*3/uL (ref 150–440)
RBC: 3.76 MIL/uL — ABNORMAL LOW (ref 3.80–5.20)
RDW: 13.5 % (ref 11.5–14.5)
WBC: 25.1 10*3/uL — AB (ref 3.6–11.0)

## 2015-07-11 NOTE — Progress Notes (Signed)
   Subjective: 1 Day Post-Op Procedure(s) (LRB): INTRAMEDULLARY (IM) NAIL INTERTROCHANTRIC (Right) Patient reports pain as 0 on 0-10 scale.   Patient is well, and has had no acute complaints or problems We will start therapy today.  Plan is to go Rehab after hospital stay. no nausea and no vomiting Patient denies any chest pains or shortness of breath. Objective: Vital signs in last 24 hours: Temp:  [97.4 F (36.3 C)-99.1 F (37.3 C)] 98.2 F (36.8 C) (10/13 0149) Pulse Rate:  [80-97] 80 (10/13 0415) Resp:  [16-28] 18 (10/13 0415) BP: (95-116)/(43-60) 101/46 mmHg (10/13 0415) SpO2:  [87 %-98 %] 93 % (10/13 0415) well approximated incision Heels are non tender and elevated off the bed using rolled towels Intake/Output from previous day: 10/12 0701 - 10/13 0700 In: 603.8 [I.V.:603.8] Out: 325 [Urine:305; Blood:20] Intake/Output this shift: Total I/O In: 603.8 [I.V.:603.8] Out: 250 [Urine:230; Blood:20]   Recent Labs  07/09/15 1336 07/10/15 0604 07/11/15 0537  HGB 11.8* 11.8* 11.1*    Recent Labs  07/10/15 0604 07/11/15 0537  WBC 17.0* 25.1*  RBC 3.84 3.76*  HCT 35.3 34.9*  PLT 355 327    Recent Labs  07/10/15 0604 07/11/15 0537  NA 135 133*  K 4.3 4.4  CL 98* 99*  CO2 28 23  BUN 13 20  CREATININE 0.78 1.13*  GLUCOSE 78 118*  CALCIUM 8.6* 7.8*    Recent Labs  07/09/15 1336  INR 1.07    EXAM General - Patient is Alert, Appropriate and Confused Extremity - Neurologically intact Neurovascular intact Sensation intact distally Intact pulses distally Dorsiflexion/Plantar flexion intact Dressing - dressing C/D/I Motor Function - intact, moving foot and toes well on exam.    Past Medical History  Diagnosis Date  . Diabetes mellitus without complication ([place])   . Hyperlipidemia   . Hypertension   . Chronic kidney disease   . Alzheimer's dementia     Assessment/Plan: 1 Day Post-Op Procedure(s) (LRB): INTRAMEDULLARY (IM) NAIL  INTERTROCHANTRIC (Right) Active Problems:   Hip fracture ([facility])  Estimated body mass index is 19.15 kg/(m^2) as calculated from the following:   Height as of 04/04/15: 5' 2.1" (1.577 m).   Weight as of this encounter: 47.628 kg (105 lb). Advance diet Up with therapy D/C IV fluids Discharge to SNF on Saturday  Labs: Were reviewed. Slight hyponatremia. We'll do fluid restriction for 1 day. Met B tomorrow morning DVT Prophylaxis - Lovenox, Foot Pumps and TED hose Weight-Bearing as tolerated to right leg Begin working on a bowel movement D/C O2 and Pulse OX and try on Room [employer]. [place] 07/11/2015, 6:40 AM

## 2015-07-11 NOTE — Clinical Documentation Improvement (Signed)
Hospitalist Please update your documentation within the medical record to reflect your response to this query. Thank you Can the diagnosis of CKD be further specified?   CKD Stage I - GFR greater than or equal to 90  CKD Stage II - GFR 60-89  CKD Stage III - GFR 30-59  CKD Stage IV - GFR 15-29  CKD Stage V - GFR < 15  ESRD (End Stage Renal Disease)  Other condition  Unable to clinically determine  Supporting Information: : (risk factors, signs and symptoms, diagnostics, treatment) 07/11/15 progr note.Marland KitchenMarland Kitchen"Chronic kidney disease.Marland KitchenMarland Kitchen"Met B tomorrow morning..." Results for Dana Riddle, Dana Riddle (MRN [id number]) as of 07/11/2015 07:59  07/09/2015 13:36 07/10/2015 06:04 07/11/2015 05:37  EGFR (Non-African Amer.) 52 (L) >60 43 (L)   Please exercise your independent, professional judgment when responding. A specific answer is not anticipated or expected.  Thank You, Ermelinda Das, RN, BSN, [place] Certified Clinical Documentation Specialist [place]: Health Information Management [phone]

## 2015-07-11 NOTE — Progress Notes (Signed)
   07/11/15 1118  Clinical Encounter Type  Visited With Patient  Visit Type Initial  Consult/Referral To Chaplain  Stress Factors  Patient Stress Factors Not reviewed  Chaplain rounded in unit and offered pastoral care to patient.   Chaplain Phylliss Strege [phone]

## 2015-07-11 NOTE — Progress Notes (Signed)
Clinical Child psychotherapistocial Worker (CSW) faxed requested clinicals to DSS guardian Joyce GrossKay.   Jetta LoutBailey Morgan, LCSWA [phone]

## 2015-07-11 NOTE — Progress Notes (Signed)
Clinical Child psychotherapistocial Worker (CSW) contacted patient's DSS guardian Joyce GrossKay and presented bed offers. Kay chose [facility]. [facility] and made him aware of accepted offer. Plan is for patient to D/C to [facility] on Saturday. CSW will continue to follow and assist as needed.   Jetta LoutBailey Morgan, LCSWA [phone]

## 2015-07-11 NOTE — Progress Notes (Signed)
[facility] - [place] [facility]   PATIENT NAME: Dana Riddle    MR#:  [id number]  DATE OF BIRTH:  02/27/1929  SUBJECTIVE:  CHIEF COMPLAINT:   Chief Complaint  Patient presents with  . Fall   -no complaints, demented - POD #1, right hip ORIF for fracture - congested cough noted  REVIEW OF SYSTEMS:  Review of Systems  Unable to perform ROS: dementia    DRUG ALLERGIES:  No Known Allergies  VITALS:  Blood pressure 113/53, pulse 74, temperature 98.1 F (36.7 C), temperature source Oral, resp. rate 18, weight 47.628 kg (105 lb), SpO2 94 %.  PHYSICAL EXAMINATION:  Physical Exam  GENERAL:  81 y.o.-year-old, malnourished appearing, patient lying in the bed with no acute distress.  EYES: Pupils equal, round, reactive to light and accommodation. No scleral icterus. Extraocular muscles intact.  HEENT: Head atraumatic, normocephalic. Oropharynx and nasopharynx clear.  NECK:  Supple, no jugular venous distention. No thyroid enlargement, no tenderness.  LUNGS: Normal breath sounds bilaterally, no wheezing, rales,rhonchi or crepitation. No use of accessory muscles of respiration. Decreased bibasilar breath sounds CARDIOVASCULAR: S1, S2 normal. No  rubs, or gallops. 3/6 systolic murmur present ABDOMEN: Soft, nontender, nondistended. Bowel sounds present. No organomegaly or mass.  EXTREMITIES: No pedal edema, cyanosis, or clubbing.  NEUROLOGIC: Unable to do a complete neuro exam due to dementia and noncooperation. Able to move both upper extremities in bed. Improved mobility of the right leg as well PSYCHIATRIC: The patient is alert but not oriented SKIN: No obvious rash, lesion, or ulcer.    LABORATORY PANEL:   CBC  Recent Labs Lab 07/11/15 0537  WBC 25.1*  HGB 11.1*  HCT 34.9*  PLT 327   ------------------------------------------------------------------------------------------------------------------  Chemistries   Recent Labs Lab  07/09/15 1336  07/11/15 0537  NA 131*  < > 133*  K 4.1  < > 4.4  CL 98*  < > 99*  CO2 28  < > 23  GLUCOSE 118*  < > 118*  BUN 11  < > 20  CREATININE 0.97  < > 1.13*  CALCIUM 8.3*  < > 7.8*  AST 17  --   --   ALT 12*  --   --   ALKPHOS 83  --   --   BILITOT 0.4  --   --   < > = values in this interval not displayed. ------------------------------------------------------------------------------------------------------------------  Cardiac Enzymes No results for input(s): TROPONINI in the last 168 hours. ------------------------------------------------------------------------------------------------------------------  RADIOLOGY:  Dg Chest 1 View  07/09/2015  CLINICAL DATA:  Unwitnessed fall, right hip pain EXAM: CHEST 1 VIEW COMPARISON:  05/31/2013 FINDINGS: There are low lung volumes with crowding of the interstitial markings. There is no focal parenchymal opacity. There is no pleural effusion or pneumothorax. The heart and mediastinal contours are unremarkable. The osseous structures are unremarkable. IMPRESSION: No active disease. Electronically Signed   By: Elige Ko   On: 07/09/2015 14:44   Ct Head Wo Contrast  07/09/2015  CLINICAL DATA:  Unwitnessed fall, initial encounter EXAM: CT HEAD WITHOUT CONTRAST CT CERVICAL SPINE WITHOUT CONTRAST TECHNIQUE: Multidetector CT imaging of the head and cervical spine was performed following the standard protocol without intravenous contrast. Multiplanar CT image reconstructions of the cervical spine were also generated. COMPARISON:  03/27/2015 FINDINGS: CT HEAD FINDINGS The bony calvarium is intact. No gross soft tissue abnormality is seen. Mucosal thickening is noted within the ethmoid and maxillary sinuses significantly increased from the prior exam. There are  atrophic changes and chronic white matter ischemic changes identified. These are similar to that seen on the prior exam. No findings to suggest acute hemorrhage, acute infarction or  space-occupying mass lesion are noted. CT CERVICAL SPINE FINDINGS Seven cervical segments are well visualized. Vertebral body height is well maintained. Disc space narrowing is noted from C3 to C6 with associated osteophytic changes. Multilevel facet hypertrophic changes are seen. No acute fracture or acute facet abnormality is noted. The surrounding soft tissue structures show improvement in the skin thickening seen in the posterior right neck on the prior exam. No acute soft tissue abnormality is noted. The visualized lung apices are within normal limits. IMPRESSION: CT of the head: Stable atrophic in chronic white matter ischemic changes. No acute abnormality noted. CT of the cervical spine: Degenerative changes without acute abnormality. Electronically Signed   By: Alcide Clever M.D.   On: 07/09/2015 14:43   Ct Cervical Spine Wo Contrast  07/09/2015  CLINICAL DATA:  Unwitnessed fall, initial encounter EXAM: CT HEAD WITHOUT CONTRAST CT CERVICAL SPINE WITHOUT CONTRAST TECHNIQUE: Multidetector CT imaging of the head and cervical spine was performed following the standard protocol without intravenous contrast. Multiplanar CT image reconstructions of the cervical spine were also generated. COMPARISON:  03/27/2015 FINDINGS: CT HEAD FINDINGS The bony calvarium is intact. No gross soft tissue abnormality is seen. Mucosal thickening is noted within the ethmoid and maxillary sinuses significantly increased from the prior exam. There are atrophic changes and chronic white matter ischemic changes identified. These are similar to that seen on the prior exam. No findings to suggest acute hemorrhage, acute infarction or space-occupying mass lesion are noted. CT CERVICAL SPINE FINDINGS Seven cervical segments are well visualized. Vertebral body height is well maintained. Disc space narrowing is noted from C3 to C6 with associated osteophytic changes. Multilevel facet hypertrophic changes are seen. No acute fracture or acute  facet abnormality is noted. The surrounding soft tissue structures show improvement in the skin thickening seen in the posterior right neck on the prior exam. No acute soft tissue abnormality is noted. The visualized lung apices are within normal limits. IMPRESSION: CT of the head: Stable atrophic in chronic white matter ischemic changes. No acute abnormality noted. CT of the cervical spine: Degenerative changes without acute abnormality. Electronically Signed   By: Alcide Clever M.D.   On: 07/09/2015 14:43   Ct Hip Right Wo Contrast  07/09/2015  CLINICAL DATA:  Unwitnessed fall.  Hip pain.  Fracture. EXAM: CT OF THE RIGHT HIP WITHOUT CONTRAST TECHNIQUE: Multidetector CT imaging of the right hip was performed according to the standard protocol. Multiplanar CT image reconstructions were also generated. COMPARISON:  07/09/2015 FINDINGS: Intertrochanteric fracture of the right femur does appear to have a component which extends along the basicervical region of the right femoral neck anteriorly, for example on image 28 of series 6 and image 33 series 2, and this may have an intra-articular component. There is some degenerative articular space narrowing in the right hip. Bony demineralization. Aortoiliac atherosclerotic vascular disease. Fluid density lesion along the right posterior pelvis could reflect a hydrosalpinx or cystic lesion of the right ovary. There is spurring of the right acetabulum but the acetabulum is otherwise intact. IMPRESSION: 1. Right intertrochanteric fracture does appear to have a component which extends along the anterior basicervical region of the femoral neck and thus may have an intra-articular component. 2. Curving fluid density structure in the right posterior pelvis measuring 4.5 by 2.6 cm on image 5 series 5,  possibly a hydrosalpinx, less likely an ovarian cyst or bladder diverticulum. Consider further assessment with pelvic sonography. Electronically Signed   By: Gaylyn Rong  M.D.   On: 07/09/2015 16:49   Dg C-arm 1-60 Min  07/10/2015  CLINICAL DATA:  81 year old female undergoing ORIF proximal right femur. Initial encounter. EXAM: RIGHT FEMUR 2 VIEWS; DG C-ARM 61-120 MIN COMPARISON:  Right hip CT 07/09/2015. FINDINGS: Four intraoperative fluoroscopic views of the proximal right femur. Intra medullary rod with proximal interlocking dynamic hip screw and distal interlocking cortical screw in place with near anatomic alignment about the intertrochanteric fracture. Hardware appears intact. IMPRESSION: ORIF proximal right femur with no adverse features. Electronically Signed   By: Odessa Fleming M.D.   On: 07/10/2015 21:04   Dg Hip Unilat With Pelvis 2-3 Views Right  07/09/2015  CLINICAL DATA:  Right hip pain after fall. EXAM: DG HIP (WITH OR WITHOUT PELVIS) 2-3V RIGHT COMPARISON:  None. FINDINGS: There appears to be mildly displaced fracture overlying the trochanteric region which potentially may extend into the femoral neck. CT scan may be performed for further evaluation. No significant degenerative changes seen involving either hip joint. IMPRESSION: Mildly displaced fracture is seen in the trochanteric region of the proximal right femur. CT scan of the hip is recommended to evaluate for possible involvement of the femoral neck. Electronically Signed   By: Lupita Raider, M.D.   On: 07/09/2015 14:44   Dg Femur, Min 2 Views Right  07/10/2015  CLINICAL DATA:  81 year old female undergoing ORIF proximal right femur. Initial encounter. EXAM: RIGHT FEMUR 2 VIEWS; DG C-ARM 61-120 MIN COMPARISON:  Right hip CT 07/09/2015. FINDINGS: Four intraoperative fluoroscopic views of the proximal right femur. Intra medullary rod with proximal interlocking dynamic hip screw and distal interlocking cortical screw in place with near anatomic alignment about the intertrochanteric fracture. Hardware appears intact. IMPRESSION: ORIF proximal right femur with no adverse features. Electronically Signed    By: Odessa Fleming M.D.   On: 07/10/2015 21:04    EKG:   Orders placed or performed during the hospital encounter of 07/09/15  . EKG 12-Lead  . EKG 12-Lead    ASSESSMENT AND PLAN:   81 year old female with past medical history significant for Alzheimer's dementia, CK D, hypertension and diabetes from [place] [facility] assisted living facility brought in after a fall and right hip fracture.  #1 Right hip fracture-secondary to mechanical fall -Appreciate orthopedics consult.  -s/p ORIF and POD#1 -Pain management, physical therapy  -Monitor hemoglobin after surgery. - to rehab in 2 days  #2 leukocytosis-no evidence of infection noted. Worsened today - check differential as well -CXR and UA again today -Continue to monitor at this time.   #3 diabetes mellitus- - patient on Januvia and empagliflozin as outpatient.  - poor intake and normal sugars now- cont to hold those meds - SSI for now  #4 Depression and anxiety-on Remeron and Klonopin. Will continue that.  #5 DVT prophylaxis- started on lovenox  #6 Hyponatremia- change dietary restrictions, encourage oral intake  All the records are reviewed and case discussed with Care Management/Social Workerr. Management plans discussed with the patient, family and they are in agreement.  CODE STATUS: Full code  TOTAL TIME TAKING CARE OF THIS PATIENT: 37 minutes.   POSSIBLE D/C IN 2-3 DAYS, DEPENDING ON CLINICAL CONDITION.   Daray Polgar M.D on 07/11/2015 at 12:41 PM  Between 7am to 6pm - Pager - [phone]  After 6pm go to [email or web address] - password [email or web address] [facility]  Hospitalists  Office  [phone]  CC: Primary care physician; Vonita MossMark Crissman, MD

## 2015-07-11 NOTE — Care Management Important Message (Signed)
Important Message  Patient Details  Name: Josetta HuddleJosephine E Crutchley MRN: [id number] Date of Birth: Aug 11, 1929   Medicare Important Message Given:  Yes-second notification given    Olegario MessierKathy A Allmond 07/11/2015, 11:01 AM

## 2015-07-11 NOTE — Discharge Instructions (Signed)
° °HIP FRACTURE POSTOPERATIVE DIRECTIONS ° °Hip Rehabilitation, Guidelines Following Surgery  °The results of a hip operation are greatly improved after range of motion and muscle strengthening exercises. Follow all safety measures which are given to protect your hip. If any of these exercises cause increased pain or swelling in your joint, decrease the amount until you are comfortable again. Then slowly increase the exercises. Call your caregiver if you have problems or questions.  ° °HOME CARE INSTRUCTIONS  °Remove items at home which could result in a fall. This includes throw rugs or furniture in walking pathways.  °· ICE to the affected hip every three hours for 30 minutes at a time and then as needed for pain and swelling.  Continue to use ice on the hip for pain and swelling from surgery. You may notice swelling that will progress down to the foot and ankle.  This is normal after surgery.  Elevate the leg when you are not up walking on it.   °· Continue to use the breathing machine which will help keep your temperature down.  It is common for your temperature to cycle up and down following surgery, especially at night when you are not up moving around and exerting yourself.  The breathing machine keeps your lungs expanded and your temperature down. ° °DIET °You may resume your previous home diet once your are discharged from the hospital. If you go to a rehab facility after surgery you will need to be on the diet appropriate for your medical history.  ° °DRESSING / WOUND  CARE / SHOWERING ° °DO NOT GET THE INCISION WET °You may start showering once staples have been removed at 2 weeks. Change dressing as needed. Do not submerge the incision in water such as a bath tub, swimming pool or hot tubs until the incision is completely healed which is approximately 4 weeks. ° ° °ACTIVITY °Walk with your walker as instructed. °Use walker as long as suggested by your caregivers.May go to using a cane once your  therapist feels that it is safe to do so. °Avoid periods of inactivity such as sitting longer than an hour when not asleep. This helps prevent blood clots.  °You may resume a sexual relationship in one month or when given the OK by your doctor.  °You may return to work once you are cleared by your doctor.  °Do not drive a car for 6 weeks or until released by you surgeon.  °Do not drive while taking narcotics. ° ° °WEIGHT BEARING °You may wait bear as tolerated on the surgical leg. ° °POSTOPERATIVE CONSTIPATION PROTOCOL °Constipation - defined medically as fewer than three stools per week and severe constipation as less than one stool per week. ° °One of the most common issues patients have following surgery is constipation.  Even if you have a regular bowel pattern at home, your normal regimen is likely to be disrupted due to multiple reasons following surgery.  Combination of anesthesia, postoperative narcotics, change in appetite and fluid intake all can affect your bowels.  In order to avoid complications following surgery, here are some recommendations in order to help you during your recovery period. ° °Colace (docusate) - Pick up an over-the-counter form of Colace or another stool softener and take twice a day as long as you are requiring postoperative pain medications.  Take with a full glass of water daily.  If you experience loose stools or diarrhea, hold the colace until you stool forms back up.    If your symptoms do not get better within 1 week or if they get worse, check with your doctor. ° °Dulcolax (bisacodyl) - Pick up over-the-counter and take as directed by the product packaging as needed to assist with the movement of your bowels.  Take with a full glass of water.  Use this product as needed if not relieved by Colace only.  ° °MiraLax (polyethylene glycol) - Pick up over-the-counter to have on hand.  MiraLax is a solution that will increase the amount of water in your bowels to assist with bowel  movements.  Take as directed and can mix with a glass of water, juice, soda, coffee, or tea.  Take if you go more than two days without a movement. °Do not use MiraLax more than once per day. Call your doctor if you are still constipated or irregular after using this medication for 7 days in a row. ° °If you continue to have problems with postoperative constipation, please contact the office for further assistance and recommendations.  If you experience "the worst abdominal pain ever" or develop nausea or vomiting, please contact the office immediatly for further recommendations for treatment. ° °ITCHING ° If you experience itching with your medications, try taking only a single pain pill, or even half a pain pill at a time.  You can also use Benadryl over the counter for itching or also to help with sleep.  ° °TED HOSE STOCKINGS °Wear the elastic stockings on both legs. If you go home, you may remove these at night but will need to put them on the first thing in the morning. If you go to rehab, then you may remove them one hour per 8 hour shift. This is because in rehab you are not as active as you are at home. ° °MEDICATIONS °See your medication summary on the “After Visit Summary” that the nursing staff will review with you prior to discharge.  You may have some home medications which will be placed on hold until you complete the course of blood thinner medication.  It is important for you to complete the blood thinner medication as prescribed by your surgeon.  Continue your approved medications as instructed at time of discharge. ° °PRECAUTIONS °If you experience chest pain or shortness of breath - call 911 immediately for transfer to the hospital emergency department.  °If you develop a fever greater that 101 F, purulent drainage from wound, increased redness or drainage from wound, foul odor from the wound/dressing, or calf pain - CONTACT YOUR SURGEON.   °                                                 °FOLLOW-UP APPOINTMENTS °Make sure you keep all of your appointments after your operation with your surgeon and caregivers. You should call the office at the above phone number and make an appointment for approximately 6 weeks after the date of your surgery or on the date instructed by your surgeon outlined in the "After Visit Summary".  Sooner if there is complication. ° °RANGE OF MOTION AND STRENGTHENING EXERCISES  °These exercises are designed to help you keep full movement of your hip joint. Follow your caregiver's or physical therapist's instructions. Perform all exercises about fifteen times, three times per day or as directed. Exercise both hips, even if you have had only one joint   replacement. These exercises can be done on a training (exercise) mat, on the floor, on a table or on a bed. Use whatever works the best and is most comfortable for you. Use music or television while you are exercising so that the exercises are a pleasant break in your day. This will make your life better with the exercises acting as a break in routine you can look forward to.  °Lying on your back, slowly slide your foot toward your buttocks, raising your knee up off the floor. Then slowly slide your foot back down until your leg is straight again.  °Lying on your back spread your legs as far apart as you can without causing discomfort.  °Lying on your side, raise your upper leg and foot straight up from the floor as far as is comfortable. Slowly lower the leg and repeat.  °Lying on your back, tighten up the muscle in the front of your thigh (quadriceps muscles). You can do this by keeping your leg straight and trying to raise your heel off the floor. This helps strengthen the largest muscle supporting your knee.  °Lying on your back, tighten up the muscles of your buttocks both with the legs straight and with the knee bent at a comfortable angle while keeping your heel on the floor.  ° °  ° ° °IF YOU ARE TRANSFERRED TO A SKILLED  REHAB FACILITY °If the patient is transferred to a skilled rehab facility following release from the hospital, a list of the current medications will be sent to the facility for the patient to continue.  When discharged from the skilled rehab facility, please have the facility set up the patient's Home Health Physical Therapy prior to being released. Also, the skilled facility will be responsible for providing the patient with their medications at time of release from the facility to include their pain medication, the muscle relaxants, and their blood thinner medication. If the patient is still at the rehab facility at time of the two week follow up appointment, the skilled rehab facility will also need to assist the patient in arranging follow up appointment in our office and any transportation needs. ° °MAKE SURE YOU:  °Understand these instructions.  °Get help right away if you are not doing well or get worse.  ° ° °Pick up stool softner and laxative for home use following surgery while on pain medications. °Continue to use ice for pain and swelling after surgery. °Do not use any lotions or creams on the incision until instructed by your surgeon. ° °

## 2015-07-11 NOTE — Clinical Social Work Placement (Signed)
   CLINICAL SOCIAL WORK PLACEMENT  NOTE  Date:  07/11/2015  Patient Details  Name: Dana Riddle MRN: [id number] Date of Birth: May 30, 1929  Clinical Social Work is seeking post-discharge placement for this patient at the Skilled  Nursing Facility level of care (*CSW will initial, date and re-position this form in  chart as items are completed):  Yes   Patient/family provided with [facility] Clinical Social Work Department's list of facilities offering this level of care within the geographic area requested by the patient (or if unable, by the patient's family).  Yes   Patient/family informed of their freedom to choose among providers that offer the needed level of care, that participate in Medicare, Medicaid or managed care program needed by the patient, have an available bed and are willing to accept the patient.  Yes   Patient/family informed of [facility]'s ownership interest in [facility], as well as of the fact that they are under no obligation to receive care at these facilities.  PASRR submitted to EDS on 07/10/15     PASRR number received on 07/11/15     Existing PASRR number confirmed on 07/10/15 (Patient has a level 2 [facility] PASARR)     FL2 transmitted to all facilities in geographic area requested by pt/family on 07/10/15     FL2 transmitted to all facilities within larger geographic area on       Patient informed that his/her managed care company has contracts with or will negotiate with certain facilities, including the following:        Yes   Patient/family informed of bed offers received.  Patient chooses bed at  ([facility] )     Physician recommends and patient chooses bed at      Patient to be transferred to   on  .  Patient to be transferred to facility by       Patient family notified on   of transfer.  Name of family member notified:        PHYSICIAN       Additional Comment:     _______________________________________________ Haig ProphetMorgan, Amoni Scallan G, LCSW 07/11/2015, 2:39 PM

## 2015-07-11 NOTE — Evaluation (Signed)
Physical Therapy Evaluation Patient Details Name: TINA GRUNER MRN: [id number] DOB: 05/08/1929 Today's Date: 07/11/2015   History of Present Illness  Desarie Feild is a 81 y.o. female with a known history of diabetes, depression, psoriasis. The patient is a very poor historian. She stated her foot slipped out from underneath her and had a fall. No loss of consciousness. Found to have a right hip fracture in the ER. Hospitalist services were contacted for further evaluation. Orthopedic surgery was consulted through the emergency room. Pt has been admitted s/p R hip ORIF and is currently WBAT.   Clinical Impression  Pt is awake and alert upon evaluation but remains a poor historian due to baseline dementia/alzeimers. Pt is extremely HOH. Pt requires significant amount of vc, tactile cues, and demonstration in order to educate basic techniques and sequencing for transfers and therapeutic exercises. Pt is mod assist for bed mobility; requiring PT assist for control of trunk and shoulders when coming into sitting on EOB. Once sitting; pt able to maintain position without use of BUEs; displaying fair sitting balance. Pt became nauseous and vomited after sitting ~2 minutes. Pt declined any further mobility assessment and requested to be put back in bed. Will further assess functional mobility this afternoon (sit<>stand, gait w/ RW). Pt requires continued skilled acute PT services in order to advance functional mobility overall  and strength of RLE.     Follow Up Recommendations SNF    Equipment Recommendations  Rolling walker with 5" wheels    Recommendations for Other Services       Precautions / Restrictions Precautions Precautions: Fall Restrictions Weight Bearing Restrictions: Yes RLE Weight Bearing: Weight bearing as tolerated Other Position/Activity Restrictions: s/p R femur ORIF      Mobility  Bed Mobility Overal bed mobility: Needs Assistance Bed Mobility: Supine to Sit      Supine to sit: Mod assist     General bed mobility comments: pt requires vc and demonstration for sequencing of LE and hip movement toward the side of the bed; pt also required tactile cues for reaching across with opposite UE in order to pull herself up on bed rail. Pt unable to pull herself up so mod assist was required for control of trunk/shoulders into sitting.   Transfers Overall transfer level:  (no transfers performed outside of bed mobility due to pt becoming nauseous and vomitting while sitting on EOB. )                  Ambulation/Gait Ambulation/Gait assistance:  (see above)              Stairs            Wheelchair Mobility    Modified Rankin (Stroke Patients Only)       Balance Overall balance assessment: No apparent balance deficits (not formally assessed)                                           Pertinent Vitals/Pain Pain Assessment: No/denies pain    Home Living Family/patient expects to be discharged to:: Other (Comment) ([facility] per CSW; pt is poor historian due to dementia/alzheimers)                      Prior Function           Comments: pt  states that she was independent without an assistive device prior to admission to hospital; [facility] need [facility]     Hand Dominance        Extremity/Trunk Assessment   Upper Extremity Assessment: Defer to OT evaluation           Lower Extremity Assessment: Overall WFL for tasks assessed (gross screen of BLE function performed; pt has hard time following commands but was able to perform AROM exercises in supine with good range. Gross BLE strength at least 3/5 )         Communication   Communication: HOH  Cognition Arousal/Alertness: Awake/alert Behavior During Therapy: WFL for tasks assessed/performed Overall Cognitive Status: History of cognitive impairments - at  baseline                      General Comments      Exercises Total Joint Exercises Ankle Circles/Pumps: AROM;Both;10 reps;Supine Heel Slides: AROM;Both;15 reps;Supine Hip ABduction/ADduction: AROM;Right;10 reps;Supine (signifcant vc and demonstration required for consistent performance) Straight Leg Raises: AAROM;Strengthening;Right (pt unable to fully lift leg off of bed; pt provided assistance under heel to allow for adequate range)      Assessment/Plan    PT Assessment Patient needs continued PT services  PT Diagnosis Generalized weakness;Difficulty walking   PT Problem List Decreased strength;Decreased range of motion;Decreased activity tolerance;Decreased mobility;Decreased cognition  PT Treatment Interventions DME instruction;Gait training;Functional mobility training;Therapeutic activities;Therapeutic exercise;Balance training   PT Goals (Current goals can be found in the Care Plan section) Acute Rehab PT Goals Patient Stated Goal: pt did not state goal    Frequency BID   Barriers to discharge        Co-evaluation               End of Session     Patient left: in bed;with bed alarm set;with call bell/phone within reach;with nursing/sitter in room (nursing assistant in room to change gown and sheets) Nurse Communication: Mobility status (nurse made aware that pt vomitted during session)         Time: [id number] PT Time Calculation (min) (ACUTE ONLY): 23 min   Charges:         PT G Codes:        Sariah Henkin,SPT 07/11/2015, 10:10 AM

## 2015-07-11 NOTE — Evaluation (Signed)
Occupational Therapy Evaluation Patient Details Name: NAYLEEN JANOSIK MRN: [id number] DOB: 1929/07/21 Today's Date: 07/11/2015    History of Present Illness This patient is an 81 year old female who came to [facility] after a fall suffering a R hip fracture. She recieved an ORIF repair.   Clinical Impression   This patient is an 81 year old female who came to [facility] after a fall suffering a R hip fracture. She received an open reduction with internal fixation repair. She lives  in assisted living  She reports she was independent with basic activities of daily living but is a poor historian.  She would benefit from Occupational Therapy for ADL/functioal mobility training.     Follow Up Recommendations       Equipment Recommendations       Recommendations for Other Services       Precautions / Restrictions Precautions Precautions: Fall Restrictions Weight Bearing Restrictions: Yes RLE Weight Bearing: Weight bearing as tolerated Other Position/Activity Restrictions: s/p R femur ORIF      Mobility Bed Mobility  Transfers                    Balance                                          ADL                                         General ADL Comments: .Practiced techniques for lower body dressing using hip kit hand over hand assist to illustrate technique.     Vision     Perception     Praxis      Pertinent Vitals/Pain Pain Assessment: No/denies pain     Hand Dominance     Extremity/Trunk Assessment Upper Extremity Assessment Upper Extremity Assessment: Overall WFL for tasks assessed   Lower Extremity Assessment Lower Extremity Assessment: Overall WFL for tasks assessed (gross screen of BLE function performed; pt has hard time following commands but was able to perform AROM exercises in supine with good range. Gross BLE strength at least 3/5 )       Communication  Communication Communication: HOH   Cognition Arousal/Alertness: Awake/alert Behavior During Therapy: WFL for tasks assessed/performed Overall Cognitive Status: History of cognitive impairments - at baseline                     General Comments       Exercises       Shoulder Instructions      Home Living Family/patient expects to be discharged to:: Other (Comment) ([facility] lives at [facility])                                        Prior Functioning/Environment          Comments: Patient reports independent with assistive device but patient is poor historian.    OT Diagnosis: Acute pain   OT Problem List:     OT Treatment/Interventions: Self-care/ADL training    OT Goals(Current goals can be found in the care plan section) Acute Rehab OT Goals Patient Stated Goal: none stated Time For Goal  Achievement: 07/25/15 Potential to Achieve Goals: Good  OT Frequency: Min 1X/week   Barriers to D/C:            Co-evaluation              End of Session Equipment Utilized During Treatment:  (hip kit)  Activity Tolerance:   Patient left: in bed;with call bell/phone within reach;with bed alarm set   Time: 1013-1027 OT Time Calculation (min): 14 min Charges:  OT General Charges $OT Visit: 1 Procedure OT Evaluation $Initial OT Evaluation Tier I: 1 Procedure G-Codes:    Myrene Galas, MS/OTR/L  07/11/2015, 11:47 AM

## 2015-07-11 NOTE — Progress Notes (Signed)
Physical Therapy Treatment Patient Details Name: Dana Riddle MRN: [id number] DOB: 18-Sep-1929 Today's Date: 07/11/2015    History of Present Illness This patient is an 81 year old female who came to [facility] after a fall suffering a R hip fracture. She recieved an ORIF repair.    PT Comments    Pt still has difficulty initiating meaningful movement in order to transfer from supine<>sitting. Pt still requires significant vc and instruction for sequencing of LEs and trunk in order to perform bed mobility. Pt unable to pull herself up to sitting with bed rails so PT assist provided for trunk control. Attempted sit<>stand transfer with pt and RW; able to get pt to 3/4 standing with max assist. Pt requested to sit down due to increased nausea. Pt does not appear motivated to participate in therapy and frequently states that "I just want to lie down". Pt will continue to benefit from skilled acute PT services in order to increase functional mobility independence.     Follow Up Recommendations  SNF     Equipment Recommendations  Rolling walker with 5" wheels    Recommendations for Other Services       Precautions / Restrictions Precautions Precautions: Fall Restrictions Weight Bearing Restrictions: Yes RLE Weight Bearing: Weight bearing as tolerated Other Position/Activity Restrictions: s/p R femur ORIF    Mobility  Bed Mobility Overal bed mobility: Needs Assistance Bed Mobility: Supine to Sit;Sit to Supine     Supine to sit: Mod assist Sit to supine: Mod assist   General bed mobility comments: pt requires vc and demonstration for sequencing of LE and hip movement toward the side of the bed; pt also required tactile cues for reaching across with opposite UE in order to pull herself up on bed rail. Pt unable to pull herself up so mod assist was required for control of trunk/shoulders into sitting. Pt unable to control her LEs when getting back into supine position; PT provided  assistance.   Transfers Overall transfer level: Needs assistance Equipment used: Rolling walker (2 wheeled) Transfers: Sit to/from Stand Sit to Stand: Max assist         General transfer comment: Pt unable to achieve full standing position; got to 3/4 standing before pt demanded that she sit down again due to feeling like she was going to vomit.   Ambulation/Gait Ambulation/Gait assistance:  (no gait attempted due to pt not being able to stand up fullly)               Stairs            Wheelchair Mobility    Modified Rankin (Stroke Patients Only)       Balance Overall balance assessment: Needs assistance Sitting-balance support: No upper extremity supported;Feet supported Sitting balance-Leahy Scale: Fair Sitting balance - Comments: pt able to maintain sitting balance without UE support   Standing balance support:  (full standing not achieved this session)                        Cognition Arousal/Alertness: Awake/alert Behavior During Therapy: WFL for tasks assessed/performed Overall Cognitive Status: History of cognitive impairments - at baseline                      Exercises Total Joint Exercises Ankle Circles/Pumps: AROM;Both;10 reps;Supine    General Comments        Pertinent Vitals/Pain Pain Assessment: 0-10 (pt states that her hip hurts but is unable  to rate)    Home Living Family/patient expects to be discharged to:: Other (Comment) ([facility])                    Prior Function        Comments: Patient reports independent with assistive device but patient is poor historian.   PT Goals (current goals can now be found in the care plan section) Acute Rehab PT Goals Patient Stated Goal: none stated Progress towards PT goals: Progressing toward goals    Frequency  BID    PT Plan Current plan remains appropriate    Co-evaluation             End of Session Equipment  Utilized During Treatment: Gait belt Activity Tolerance:  (pt limited due to nausea) Patient left: in bed;with call bell/phone within reach;with nursing/sitter in room;with bed alarm set;with SCD's reapplied     Time: [id number] PT Time Calculation (min) (ACUTE ONLY): 19 min  Charges:                       G CodesGeorgina Peer:      Helix Lafontaine,SPT 07/11/2015, 2:52 PM

## 2015-07-12 DIAGNOSIS — J189 Pneumonia, unspecified organism: Secondary | ICD-10-CM

## 2015-07-12 LAB — BASIC METABOLIC PANEL
ANION GAP: 9 (ref 5–15)
BUN: 26 mg/dL — ABNORMAL HIGH (ref 6–20)
CALCIUM: 7.9 mg/dL — AB (ref 8.9–10.3)
CHLORIDE: 105 mmol/L (ref 101–111)
CO2: 23 mmol/L (ref 22–32)
Creatinine, Ser: 1.02 mg/dL — ABNORMAL HIGH (ref 0.44–1.00)
GFR calc non Af Amer: 49 mL/min — ABNORMAL LOW (ref >60)
GFR, EST AFRICAN AMERICAN: 57 mL/min — AB (ref >60)
GLUCOSE: 127 mg/dL — AB (ref 65–99)
POTASSIUM: 4 mmol/L (ref 3.5–5.1)
Sodium: 137 mmol/L (ref 135–145)

## 2015-07-12 LAB — CBC WITH DIFFERENTIAL/PLATELET
BASOS PCT: 0 %
Basophils Absolute: 0.1 10*3/uL (ref 0–0.1)
Eosinophils Absolute: 0.2 10*3/uL (ref 0–0.7)
Eosinophils Relative: 1 %
HEMATOCRIT: 33.2 % — AB (ref 35.0–47.0)
HEMOGLOBIN: 11.1 g/dL — AB (ref 12.0–16.0)
LYMPHS ABS: 1.6 10*3/uL (ref 1.0–3.6)
LYMPHS PCT: 7 %
MCH: 31 pg (ref 26.0–34.0)
MCHC: 33.4 g/dL (ref 32.0–36.0)
MCV: 92.9 fL (ref 80.0–100.0)
MONOS PCT: 4 %
Monocytes Absolute: 1 10*3/uL — ABNORMAL HIGH (ref 0.2–0.9)
NEUTROS ABS: 21.2 10*3/uL — AB (ref 1.4–6.5)
NEUTROS PCT: 88 %
Platelets: 371 10*3/uL (ref 150–440)
RBC: 3.57 MIL/uL — ABNORMAL LOW (ref 3.80–5.20)
RDW: 13.7 % (ref 11.5–14.5)
WBC: 24.1 10*3/uL — ABNORMAL HIGH (ref 3.6–11.0)

## 2015-07-12 LAB — GLUCOSE, CAPILLARY
GLUCOSE-CAPILLARY: 159 mg/dL — AB (ref 65–99)
Glucose-Capillary: 140 mg/dL — ABNORMAL HIGH (ref 65–99)
Glucose-Capillary: 115 mg/dL — ABNORMAL HIGH (ref 65–99)
Glucose-Capillary: 89 mg/dL (ref 65–99)

## 2015-07-12 MED ORDER — ENOXAPARIN SODIUM 40 MG/0.4ML ~~LOC~~ SOLN: 40.0000 mg | SUBCUTANEOUS | Status: DC

## 2015-07-12 MED ORDER — TAMSULOSIN HCL 0.4 MG PO CAPS: 0.4000 mg | ORAL_CAPSULE | Freq: Every day | ORAL | Status: DC

## 2015-07-12 MED ORDER — TRAMADOL HCL 50 MG PO TABS: 50.0000 mg | ORAL_TABLET | ORAL | Status: DC | PRN

## 2015-07-12 MED ORDER — CLONAZEPAM 0.5 MG PO TABS: 0.2500 mg | ORAL_TABLET | Freq: Every day | ORAL | Status: DC | PRN

## 2015-07-12 MED ORDER — FERROUS SULFATE 325 (65 FE) MG PO TABS: 325.0000 mg | ORAL_TABLET | Freq: Two times a day (BID) | ORAL | Status: DC

## 2015-07-12 MED ORDER — OXYCODONE HCL 5 MG PO TABS: 5.0000 mg | ORAL_TABLET | ORAL | Status: DC | PRN

## 2015-07-12 MED ORDER — LEVOFLOXACIN 500 MG PO TABS
500.0000 mg | ORAL_TABLET | Freq: Once | ORAL | Status: AC
  Administered 2015-07-12: 500 mg via ORAL
  Filled 2015-07-12: qty 1

## 2015-07-12 MED ORDER — SODIUM CHLORIDE 0.9 % IV BOLUS (SEPSIS)
500.0000 mL | Freq: Once | INTRAVENOUS | Status: AC
  Administered 2015-07-12: 500 mL via INTRAVENOUS

## 2015-07-12 MED ORDER — TAMSULOSIN HCL 0.4 MG PO CAPS
0.4000 mg | ORAL_CAPSULE | Freq: Every day | ORAL | Status: DC
  Administered 2015-07-12 – 2015-07-13 (×2): 0.4 mg via ORAL
  Filled 2015-07-12 (×2): qty 1

## 2015-07-12 MED ORDER — LEVOFLOXACIN 250 MG PO TABS
250.0000 mg | ORAL_TABLET | Freq: Every day | ORAL | Status: DC
  Administered 2015-07-13: 250 mg via ORAL
  Filled 2015-07-12: qty 1

## 2015-07-12 MED ORDER — LEVOFLOXACIN 250 MG PO TABS: 250.0000 mg | ORAL_TABLET | Freq: Every day | ORAL | Status: AC

## 2015-07-12 NOTE — Progress Notes (Addendum)
   Subjective: 2 Days Post-Op Procedure(s) (LRB): INTRAMEDULLARY (IM) NAIL INTERTROCHANTRIC (Right) Patient reports pain as 0 on 0-10 scale.   Patient is well, and has had no acute complaints or problems We will continue therapy today.  Plan is to go Rehab after hospital stay. no nausea and no vomiting Patient denies any chest pains or shortness of breath. Objective: Vital signs in last 24 hours: Temp:  [97.5 F (36.4 C)-98.1 F (36.7 C)] 98 F (36.7 C) (10/14 0341) Pulse Rate:  [74-91] 77 (10/14 0341) Resp:  [17-20] 19 (10/14 0341) BP: (105-113)/(45-56) 111/56 mmHg (10/14 0341) SpO2:  [91 %-94 %] 94 % (10/14 0341) well approximated incision . The honeycomb dressing intact. The right leg is slightly flexed but able to extend as needed. Intake/Output from previous day: 10/13 0701 - 10/14 0700 In: 2416.3 [P.O.:480; I.V.:1786.3; IV Piggyback:150] Out: 650 [Urine:650] Intake/Output this shift: Total I/O In: 1786.3 [I.V.:1786.3] Out: 650 [Urine:650]   Recent Labs  07/09/15 1336 07/10/15 0604 07/11/15 0537  HGB 11.8* 11.8* 11.1*    Recent Labs  07/10/15 0604 07/11/15 0537  WBC 17.0* 25.1*  RBC 3.84 3.76*  HCT 35.3 34.9*  PLT 355 327    Recent Labs  07/10/15 0604 07/11/15 0537  NA 135 133*  K 4.3 4.4  CL 98* 99*  CO2 28 23  BUN 13 20  CREATININE 0.78 1.13*  GLUCOSE 78 118*  CALCIUM 8.6* 7.8*    Recent Labs  07/09/15 1336  INR 1.07    EXAM General - Patient is Alert, Appropriate and Confused Extremity - Neurologically intact Neurovascular intact Sensation intact distally Intact pulses distally Dorsiflexion/Plantar flexion intact Dressing - dressing C/D/I Motor Function - intact, moving foot and toes well on exam.    Past Medical History  Diagnosis Date  . Diabetes mellitus without complication ([place])   . Hyperlipidemia   . Hypertension   . Chronic kidney disease   . Alzheimer's dementia     Assessment/Plan: 2 Days Post-Op Procedure(s)  (LRB): INTRAMEDULLARY (IM) NAIL INTERTROCHANTRIC (Right) Active Problems:   Hip fracture (HCC)  Estimated body mass index is 19.15 kg/(m^2) as calculated from the following:   Height as of 04/04/15: 5' 2.1" (1.577 m).   Weight as of this encounter: 47.628 kg (105 lb). Advance diet Up with therapy D/C IV fluids Discharge to SNF on Saturday  Labs: Were reviewed. Slight hyponatremia.  Still awaiting Met B this morning DVT Prophylaxis - Lovenox, Foot Pumps and TED hose Weight-Bearing as tolerated to right leg Begin working on a bowel movement Patient will need to follow-up in [place] in 6 weeks with Dr. Haskel Khan will need to be removed 2 weeks postop follow-up by the application of benzoin and Steri-Strips We will need to continue Lovenox 2 weeks following discharge to rehabilitation Change dressing as needed No showering until the staples are removed at 2 weeks postop  Reche Dixon PA-C [place] 07/12/2015, 6:24 AM

## 2015-07-12 NOTE — Discharge Summary (Addendum)
[facility] Physicians - [place] at [facility]   PATIENT NAME: Dana Riddle    MR#:  [id number]  DATE OF BIRTH:  16-Feb-1929  DATE OF ADMISSION:  07/09/2015 ADMITTING PHYSICIAN: Alford Highland, MD  DATE OF DISCHARGE: 07/13/2015  PRIMARY CARE PHYSICIAN: Vonita Moss, MD    ADMISSION DIAGNOSIS:  Hip fracture, right, closed, initial encounter (HCC) [S72.001A]  DISCHARGE DIAGNOSIS:  Active Problems:   Hip fracture (HCC)   Pneumonia   SECONDARY DIAGNOSIS:   Past Medical History  Diagnosis Date  . Diabetes mellitus without complication (HCC)   . Hyperlipidemia   . Hypertension   . Chronic kidney disease   . Alzheimer's dementia     HOSPITAL COURSE:   81 year old female with past medical history significant for Alzheimer's dementia, CK D, hypertension and diabetes from [place] [facility] assisted living facility brought in after a fall and right hip fracture.  #1 Right hip fracture-secondary to mechanical fall -Appreciate orthopedics consult.  -s/p ORIF and POD# 2 -Pain management, physical therapy  -hemoglobin stable after surgery.  #2 Leukocytosis- CXR with new right lower lobe infiltrate - UA was normal twice - start levaquin, likely CAP vs aspiration -Continue to monitor at this time.  - check wbc again in am  #3 diabetes mellitus- - patient on Januvia, lantus and empagliflozin as outpatient.  - poor intake and normal sugars now- cont to hold those meds  #4 Depression and anxiety-on Remeron and Klonopin. Will continue that.  #5 DVT prophylaxis- on lovenox-for 2 weeks postoperatively  #6 Urinary retention- Foley discontinued this morning. Patient hasn't voided yet. -Bladder scan, if greater than 300 cc we will do an in and out catheter and monitor again. -IV fluid bolus as BUN and creatinine elevating. Continue to monitor closely. - also added flomax  She will be discharged to a rehabilitation place if medically stable on  07/13/2015  DISCHARGE CONDITIONS:   Guarded  CONSULTS OBTAINED:  Treatment Team:  Donato Heinz, MD  DRUG ALLERGIES:  No Known Allergies  DISCHARGE MEDICATIONS:   Current Discharge Medication List    START taking these medications   Details  enoxaparin (LOVENOX) 40 MG/0.4ML injection Inject 0.4 mLs (40 mg total) into the skin daily. Qty: 14 Syringe, Refills: 0    ferrous sulfate 325 (65 FE) MG tablet Take 1 tablet (325 mg total) by mouth 2 (two) times daily with a meal. Qty: 30 tablet, Refills: 0    levofloxacin (LEVAQUIN) 250 MG tablet Take 1 tablet (250 mg total) by mouth daily. Qty: 6 tablet, Refills: 0    oxyCODONE (OXY IR/ROXICODONE) 5 MG immediate release tablet Take 1-2 tablets (5-10 mg total) by mouth every 4 (four) hours as needed for breakthrough pain ((for MODERATE breakthrough pain)). Qty: 30 tablet, Refills: 0    tamsulosin (FLOMAX) 0.4 MG CAPS capsule Take 1 capsule (0.4 mg total) by mouth daily. Qty: 30 capsule, Refills: 2    traMADol (ULTRAM) 50 MG tablet Take 1-2 tablets (50-100 mg total) by mouth every 4 (four) hours as needed for moderate pain. Qty: 30 tablet, Refills: 0      CONTINUE these medications which have CHANGED   Details  clonazePAM (KLONOPIN) 0.5 MG tablet Take 0.5 tablets (0.25 mg total) by mouth daily as needed (for agitation). Qty: 15 tablet, Refills: 0      CONTINUE these medications which have NOT CHANGED   Details  acetaminophen (TYLENOL) 500 MG tablet Take 500 mg by mouth every 6 (six) hours as needed for fever  or headache.    aspirin EC 81 MG tablet Take 81 mg by mouth daily.    mirtazapine (REMERON) 15 MG tablet Take 15 mg by mouth at bedtime.     Vortioxetine HBr (TRINTELLIX) 10 MG TABS Take 10 mg by mouth at bedtime.     white petrolatum (VASELINE) GEL Apply 1 application topically as needed for dry skin. Pt applies to legs.      STOP taking these medications     empagliflozin (JARDIANCE) 10 MG TABS tablet       insulin detemir (LEVEMIR) 100 UNIT/ML injection      sitaGLIPtin (JANUVIA) 50 MG tablet      nitrofurantoin, macrocrystal-monohydrate, (MACROBID) 100 MG capsule          DISCHARGE INSTRUCTIONS:   1. CBC and basic metabolic panel follow-up in 1 week 2. Please follow up in 2 weeks 3. Orthopedic follow-up with Dr. Ernest PineHooten in 2 weeks 4. Physical therapy   If you experience worsening of your admission symptoms, develop shortness of breath, life threatening emergency, suicidal or homicidal thoughts you must seek medical attention immediately by calling 911 or calling your MD immediately  if symptoms less severe.  You Must read complete instructions/literature along with all the possible adverse reactions/side effects for all the Medicines you take and that have been prescribed to you. Take any new Medicines after you have completely understood and accept all the possible adverse reactions/side effects.   Please note  You were cared for by a hospitalist during your hospital stay. If you have any questions about your discharge medications or the care you received while you were in the hospital after you are discharged, you can call the unit and asked to speak with the hospitalist on call if the hospitalist that took care of you is not available. Once you are discharged, your primary care physician will handle any further medical issues. Please note that NO REFILLS for any discharge medications will be authorized once you are discharged, as it is imperative that you return to your primary care physician (or establish a relationship with a primary care physician if you do not have one) for your aftercare needs so that they can reassess your need for medications and monitor your lab values.    Today   CHIEF COMPLAINT:   Chief Complaint  Patient presents with  . Fall    VITAL SIGNS:  Blood pressure 119/56, pulse 91, temperature 97.4 F (36.3 C), temperature source Oral, resp. rate 14,  weight 47.628 kg (105 lb), SpO2 94 %.  I/O:   Intake/Output Summary (Last 24 hours) at 07/12/15 1243 Last data filed at 07/12/15 0800  Gross per 24 hour  Intake 2266.25 ml  Output    650 ml  Net 1616.25 ml    PHYSICAL EXAMINATION:   Physical Exam  GENERAL:  81 y.o.-year-old patient lying in the bed with no acute distress.  EYES: Pupils equal, round, reactive to light and accommodation. No scleral icterus. Extraocular muscles intact.  HEENT: Head atraumatic, normocephalic. Oropharynx and nasopharynx clear.  NECK:  Supple, no jugular venous distention. No thyroid enlargement, no tenderness.  LUNGS: Normal breath sounds bilaterally, no wheezing, rales,rhonchi or crepitation. No use of accessory muscles of respiration.  CARDIOVASCULAR: S1, S2 normal. No murmurs, rubs, or gallops.  ABDOMEN: Soft, non-tender, non-distended. Bowel sounds present. No organomegaly or mass.  EXTREMITIES: No pedal edema, cyanosis, or clubbing.  NEUROLOGIC: Cranial nerves II through XII are intact. Muscle strength 5/5 in all extremities. Sensation  intact. Gait not checked.  PSYCHIATRIC: The patient is alert and oriented x 3.  SKIN: No obvious rash, lesion, or ulcer.   DATA REVIEW:   CBC  Recent Labs Lab 07/12/15 0701  WBC 24.1*  HGB 11.1*  HCT 33.2*  PLT 371    Chemistries   Recent Labs Lab 07/09/15 1336  07/12/15 0701  NA 131*  < > 137  K 4.1  < > 4.0  CL 98*  < > 105  CO2 28  < > 23  GLUCOSE 118*  < > 127*  BUN 11  < > 26*  CREATININE 0.97  < > 1.02*  CALCIUM 8.3*  < > 7.9*  AST 17  --   --   ALT 12*  --   --   ALKPHOS 83  --   --   BILITOT 0.4  --   --   < > = values in this interval not displayed.  Cardiac Enzymes No results for input(s): TROPONINI in the last 168 hours.  Microbiology Results  Results for orders placed or performed during the hospital encounter of 07/09/15  MRSA PCR Screening     Status: None   Collection Time: 07/09/15 11:35 PM  Result Value Ref Range  Status   MRSA by PCR NEGATIVE NEGATIVE Final    Comment:        The GeneXpert MRSA Assay (FDA approved for NASAL specimens only), is one component of a comprehensive MRSA colonization surveillance program. It is not intended to diagnose MRSA infection nor to guide or monitor treatment for MRSA infections.     RADIOLOGY:  Dg Chest 2 View  07/11/2015  CLINICAL DATA:  Leukocytosis EXAM: CHEST - 2 VIEW COMPARISON:  07/09/2015 FINDINGS: Cardiac shadow is stable. Aortic calcifications are again seen and stable. Mild interstitial changes are again noted. Increased density is noted projecting in the right lung base on the lateral projection consistent with acute infiltrate. No bony abnormality is seen. IMPRESSION: New right lower lobe infiltrate. Electronically Signed   By: Alcide Clever M.D.   On: 07/11/2015 14:25   Dg C-arm 1-60 Min  07/10/2015  CLINICAL DATA:  81 year old female undergoing ORIF proximal right femur. Initial encounter. EXAM: RIGHT FEMUR 2 VIEWS; DG C-ARM 61-120 MIN COMPARISON:  Right hip CT 07/09/2015. FINDINGS: Four intraoperative fluoroscopic views of the proximal right femur. Intra medullary rod with proximal interlocking dynamic hip screw and distal interlocking cortical screw in place with near anatomic alignment about the intertrochanteric fracture. Hardware appears intact. IMPRESSION: ORIF proximal right femur with no adverse features. Electronically Signed   By: Odessa Fleming M.D.   On: 07/10/2015 21:04   Dg Femur, Min 2 Views Right  07/10/2015  CLINICAL DATA:  81 year old female undergoing ORIF proximal right femur. Initial encounter. EXAM: RIGHT FEMUR 2 VIEWS; DG C-ARM 61-120 MIN COMPARISON:  Right hip CT 07/09/2015. FINDINGS: Four intraoperative fluoroscopic views of the proximal right femur. Intra medullary rod with proximal interlocking dynamic hip screw and distal interlocking cortical screw in place with near anatomic alignment about the intertrochanteric fracture.  Hardware appears intact. IMPRESSION: ORIF proximal right femur with no adverse features. Electronically Signed   By: Odessa Fleming M.D.   On: 07/10/2015 21:04    EKG:   Orders placed or performed during the hospital encounter of 07/09/15  . EKG 12-Lead  . EKG 12-Lead      Management plans discussed with the patient, family and they are in agreement.  CODE STATUS:  Code Status Orders        Start     Ordered   07/10/15 2214  Full code   Continuous     07/10/15 2213      TOTAL TIME TAKING CARE OF THIS PATIENT: 37 minutes.    Enid Baas M.D on 07/12/2015 at 12:43 PM  Between 7am to 6pm - Pager - [phone]  After 6pm go to [email or web address] - password [email or web address] [facility]  [place] [facility]  Office  [phone]  CC: Primary care physician; Vonita Moss, MD

## 2015-07-12 NOTE — Progress Notes (Signed)
   07/12/15 1400  Clinical Encounter Type  Visited With Patient not available  Visit Type Follow-up  Consult/Referral To Chaplain  Attempted to visit with patient but she was sleeping peacefully.   Chaplain Devlyn Retter Ext:[phone]

## 2015-07-12 NOTE — Progress Notes (Signed)
Plan is for patient to D/C to [facility] tomorrow 07/13/15. Per Jomarie LongsJoseph [facility] liaison patient is going to room 140-B. RN will call report to [facility] at [phone]. Clinical Child psychotherapistocial Worker (CSW) sent D/C Summary to Exxon [employer] CorporationJoseph via carefinder today. CSW also faxed D/C Summary to patent's DSS guardian Joyce GrossKay. CSW left Continental AirlinesKay voicemail making her aware of above. CSW will continue to follow and assist as needed.   Jetta LoutBailey Morgan, LCSWA [phone]

## 2015-07-12 NOTE — Progress Notes (Signed)
Physical Therapy Treatment Patient Details Name: BLANCA CARREON MRN: [id number] DOB: 09-20-1929 Today's Date: 07/12/2015    History of Present Illness This patient is an 81 year old female who came to [facility] after a fall suffering a R hip fracture. She recieved an ORIF repair.    PT Comments    Pt continues to demonstrate reluctance to actively participate in therapy. Pt requires heavy vc and encouragement for sequencing of LEs and trunk for completion of bed mobility. Pt is still mod assist for bed mobility; PT provided assistance with movement of RLE and control of trunk and shoulders. Pt was able to complete sit<>stand and stand pivot transfers with RW and mod assist +2. Pt is unsteady when up on feet due to unbalanced weight distribution through BLEs (demonstrates preference for the LLE). Pt requires further skilled acute PT services in order to progress her functional mobility tolerance.   Follow Up Recommendations  SNF     Equipment Recommendations  Rolling walker with 5" wheels    Recommendations for Other Services       Precautions / Restrictions Precautions Precautions: Fall Restrictions Weight Bearing Restrictions: Yes RLE Weight Bearing: Weight bearing as tolerated Other Position/Activity Restrictions: s/p R femur ORIF    Mobility  Bed Mobility Overal bed mobility: Needs Assistance Bed Mobility: Supine to Sit     Supine to sit: Mod assist     General bed mobility comments: pt still requires heavy encouragement and vc for any type of mobility. Pt unable to volitionaly initiate sequencing of LE and trunk for transfer from supine<>sit; therapist must initiate passively.   Transfers Overall transfer level: Needs assistance Equipment used: Rolling walker (2 wheeled) Transfers: Sit to/from [employer] Sit to Stand: Mod assist;+2 physical assistance Stand pivot transfers: Mod assist;+2 physical assistance       General transfer comment: physical  assistance +2 required for sit<>stand and stand<>pivot transfer. Once in standing pt has difficulty actively moving and sequencing RLE in order to complete transfer. Pt also has difficulty controlling RW in correct direction.   Ambulation/Gait Ambulation/Gait assistance:  (will attempt gait this afternoon)               Stairs            Wheelchair Mobility    Modified Rankin (Stroke Patients Only)       Balance Overall balance assessment: Needs assistance Sitting-balance support: Feet supported;No upper extremity supported Sitting balance-Leahy Scale: Fair Sitting balance - Comments: pt able to maintain sitting balance without UE support   Standing balance support: Bilateral upper extremity supported Standing balance-Leahy Scale: Poor Standing balance comment: pt does not maintain equal weight bearing through BLE (less weight through R) when standing; pt requires physical assistance to maintain standing posture.                     Cognition Arousal/Alertness: Awake/alert Behavior During Therapy: Agitated Overall Cognitive Status: History of cognitive impairments - at baseline                      Exercises Total Joint Exercises Ankle Circles/Pumps: AROM;10 reps;Both    General Comments        Pertinent Vitals/Pain Pain Assessment: No/denies pain Pain Intervention(s): Limited activity within patient's tolerance;Monitored during session;Repositioned    Home Living                      Prior Function  PT Goals (current goals can now be found in the care plan section) Acute Rehab PT Goals Patient Stated Goal: none stated Progress towards PT goals: Progressing toward goals    Frequency  BID    PT Plan Current plan remains appropriate    Co-evaluation             End of Session Equipment Utilized During Treatment: Gait belt Activity Tolerance: Treatment limited secondary to agitation Patient left: in  chair;with chair alarm set;with call bell/phone within reach     Time: [id number] PT Time Calculation (min) (ACUTE ONLY): 17 min  Charges:                       G CodesGeorgina Peer:      Glori Machnik,SPT 07/12/2015, 9:46 AM

## 2015-07-12 NOTE — Progress Notes (Signed)
Inpatient Diabetes Program Recommendations  AACE/ADA: New Consensus Statement on Inpatient Glycemic Control (2015)  Target Ranges:  Prepandial:   less than 140 mg/dL      [facility] postprandial:   less than 180 mg/dL (1-2 hours)      Critically ill patients:  140 - 180 mg/dL   Review of Glycemic Control  Results for Josetta HuddleLOVE, Royann E (MRN [id number]) as of 07/12/2015 11:38  Ref. Range 07/11/2015 11:47 07/11/2015 16:35 07/11/2015 20:37 07/11/2015 23:09 07/12/2015 07:39  Glucose-Capillary Latest Ref Range: 65-99 mg/dL 409150 (H) 811279 (H) 914329 (H) 233 (H) 140 (H)    Diabetes history: Type 2 Outpatient Diabetes medications: Januvia 50mg /day, Levemir 20 units q day, Jardiance 10mg /day Current orders for Inpatient glycemic control: Novolog 0-9 units tid, Novolog 0-5 units qhs  Poor intake. CBG slightly elevated yesterday, FSB 140mg /dl this am.  Agree with current orders for Novolog administration.  Susette RacerJulie Charlize Hathaway, RN, BA, MHA, CDE Diabetes Coordinator Inpatient Diabetes Program  [phone] (Team Pager) [phone] [facility] Office) 07/12/2015 11:37 AM

## 2015-07-12 NOTE — Progress Notes (Signed)
[facility] Physicians - [place] at [facility]   PATIENT NAME: Dana Riddle    MR#:  [id number]  DATE OF BIRTH:  1929/05/08  SUBJECTIVE:  CHIEF COMPLAINT:   Chief Complaint  Patient presents with  . Fall   - POD #2, right hip ORIF for fracture - Lipase improved. Foley catheter taken out this morning. Patient hasn't voided yet  REVIEW OF SYSTEMS:  Review of Systems  Unable to perform ROS: dementia    DRUG ALLERGIES:  No Known Allergies  VITALS:  Blood pressure 119/56, pulse 91, temperature 97.4 F (36.3 C), temperature source Oral, resp. rate 14, weight 47.628 kg (105 lb), SpO2 94 %.  PHYSICAL EXAMINATION:  Physical Exam  GENERAL:  81 y.o.-year-old, malnourished appearing, patient lying in the bed with no acute distress.  EYES: Pupils equal, round, reactive to light and accommodation. No scleral icterus. Extraocular muscles intact.  HEENT: Head atraumatic, normocephalic. Oropharynx and nasopharynx clear.  NECK:  Supple, no jugular venous distention. No thyroid enlargement, no tenderness.  LUNGS: Normal breath sounds bilaterally, no wheezing, rales,rhonchi or crepitation. No use of accessory muscles of respiration. Decreased bibasilar breath sounds CARDIOVASCULAR: S1, S2 normal. No  rubs, or gallops. 3/6 systolic murmur present ABDOMEN: Soft, nontender, nondistended. Bowel sounds present. No organomegaly or mass.  EXTREMITIES: No pedal edema, cyanosis, or clubbing.  NEUROLOGIC: Unable to do a complete neuro exam due to dementia and noncooperation. Able to move both upper extremities in bed. Improved mobility of the right leg as well -Patient is following simple commands PSYCHIATRIC: The patient is alert but not oriented SKIN: No obvious rash, lesion, or ulcer.    LABORATORY PANEL:   CBC  Recent Labs Lab 07/12/15 0701  WBC 24.1*  HGB 11.1*  HCT 33.2*  PLT 371    ------------------------------------------------------------------------------------------------------------------  Chemistries   Recent Labs Lab 07/09/15 1336  07/12/15 0701  NA 131*  < > 137  K 4.1  < > 4.0  CL 98*  < > 105  CO2 28  < > 23  GLUCOSE 118*  < > 127*  BUN 11  < > 26*  CREATININE 0.97  < > 1.02*  CALCIUM 8.3*  < > 7.9*  AST 17  --   --   ALT 12*  --   --   ALKPHOS 83  --   --   BILITOT 0.4  --   --   < > = values in this interval not displayed. ------------------------------------------------------------------------------------------------------------------  Cardiac Enzymes No results for input(s): TROPONINI in the last 168 hours. ------------------------------------------------------------------------------------------------------------------  RADIOLOGY:  Dg Chest 2 View  07/11/2015  CLINICAL DATA:  Leukocytosis EXAM: CHEST - 2 VIEW COMPARISON:  07/09/2015 FINDINGS: Cardiac shadow is stable. Aortic calcifications are again seen and stable. Mild interstitial changes are again noted. Increased density is noted projecting in the right lung base on the lateral projection consistent with acute infiltrate. No bony abnormality is seen. IMPRESSION: New right lower lobe infiltrate. Electronically Signed   By: Alcide Clever M.D.   On: 07/11/2015 14:25   Dg C-arm 1-60 Min  07/10/2015  CLINICAL DATA:  81 year old female undergoing ORIF proximal right femur. Initial encounter. EXAM: RIGHT FEMUR 2 VIEWS; DG C-ARM 61-120 MIN COMPARISON:  Right hip CT 07/09/2015. FINDINGS: Four intraoperative fluoroscopic views of the proximal right femur. Intra medullary rod with proximal interlocking dynamic hip screw and distal interlocking cortical screw in place with near anatomic alignment about the intertrochanteric fracture. Hardware appears intact. IMPRESSION: ORIF proximal right femur  with no adverse features. Electronically Signed   By: Odessa FlemingH  Hall M.D.   On: 07/10/2015 21:04   Dg Femur,  Min 2 Views Right  07/10/2015  CLINICAL DATA:  81 year old female undergoing ORIF proximal right femur. Initial encounter. EXAM: RIGHT FEMUR 2 VIEWS; DG C-ARM 61-120 MIN COMPARISON:  Right hip CT 07/09/2015. FINDINGS: Four intraoperative fluoroscopic views of the proximal right femur. Intra medullary rod with proximal interlocking dynamic hip screw and distal interlocking cortical screw in place with near anatomic alignment about the intertrochanteric fracture. Hardware appears intact. IMPRESSION: ORIF proximal right femur with no adverse features. Electronically Signed   By: Odessa FlemingH  Hall M.D.   On: 07/10/2015 21:04    EKG:   Orders placed or performed during the hospital encounter of 07/09/15  . EKG 12-Lead  . EKG 12-Lead    ASSESSMENT AND PLAN:   81 year old female with past medical history significant for Alzheimer's dementia, CK D, hypertension and diabetes [place] [facility] brought in after a fall and right hip fracture.  #1 Right hip fracture-secondary to mechanical fall -Appreciate orthopedics consult.  -s/p ORIF and POD# 2 -Pain management, physical therapy  -hemoglobin stable after surgery.  #2 Leukocytosis- CXR with new right lower lobe infiltrate - UA was normal twice - start levaquin, likely CAP vs aspiration -Continue to monitor at this time.  - check wbc again in am  #3 diabetes mellitus- - patient on Januvia, lantus and empagliflozin as outpatient.  - poor intake and normal sugars now- cont to hold those meds - SSI for now  #4 Depression and anxiety-on Remeron and Klonopin. Will continue that.  #5 DVT prophylaxis- started on lovenox  #6 Urinary retention- Foley discontinued this morning. Patient hasn't voided yet. -Bladder scan, if greater than 300 cc we will do an in and out catheter and monitor again. -IV fluid bolus as BUN and creatinine elevating. Continue to monitor closely.  All the records are reviewed and case discussed with Care  Management/Social Workerr. Management plans discussed with the patient, family and they are in agreement.  CODE STATUS: Full code  TOTAL TIME TAKING CARE OF THIS PATIENT: 37 minutes.   POSSIBLE D/C TOMORROW, DEPENDING ON CLINICAL CONDITION.   Enid BaasKALISETTI,Danzig Macgregor M.D on 07/12/2015 at 12:33 PM  Between 7am to 6pm - Pager - [phone]  After 6pm go to [email or web address] - password [email or web address] [facility]  [facility] [place] [facility]  Office  [phone]  CC: Primary care physician; Vonita MossMark Crissman, MD

## 2015-07-12 NOTE — Progress Notes (Signed)
PT Cancellation Note  Patient Details Name: Dana Riddle MRN: [id number] DOB: [birth year]   Cancelled Treatment:    Reason Eval/Treat Not Completed: Patient declined, no reason specified. Pt has been up and walked with nursing staff but refused to participate in therapy this afternoon. Will re-attempt tomorrow.    Georgina PeerDaniel Mate Alegria,SPT 07/12/2015, 2:06 PM

## 2015-07-13 LAB — GLUCOSE, CAPILLARY
GLUCOSE-CAPILLARY: 138 mg/dL — AB (ref 65–99)
Glucose-Capillary: 92 mg/dL (ref 65–99)

## 2015-07-13 LAB — CBC
HEMATOCRIT: 32.5 % — AB (ref 35.0–47.0)
HEMOGLOBIN: 10.8 g/dL — AB (ref 12.0–16.0)
MCH: 31.2 pg (ref 26.0–34.0)
MCHC: 33.2 g/dL (ref 32.0–36.0)
MCV: 93.9 fL (ref 80.0–100.0)
Platelets: 336 10*3/uL (ref 150–440)
RBC: 3.46 MIL/uL — AB (ref 3.80–5.20)
RDW: 13.8 % (ref 11.5–14.5)
WBC: 15.2 10*3/uL — AB (ref 3.6–11.0)

## 2015-07-13 LAB — BASIC METABOLIC PANEL
ANION GAP: 9 (ref 5–15)
BUN: 20 mg/dL (ref 6–20)
CALCIUM: 8 mg/dL — AB (ref 8.9–10.3)
CO2: 23 mmol/L (ref 22–32)
Chloride: 107 mmol/L (ref 101–111)
Creatinine, Ser: 0.8 mg/dL (ref 0.44–1.00)
GFR calc non Af Amer: >60 mL/min (ref >60)
Glucose, Bld: 97 mg/dL (ref 65–99)
POTASSIUM: 4.2 mmol/L (ref 3.5–5.1)
Sodium: 139 mmol/L (ref 135–145)

## 2015-07-13 NOTE — Progress Notes (Signed)
Pt unable to void. Bladder Scan 750. Notified Dr Lovett CalenderPogggi Received order to in and out cath

## 2015-07-13 NOTE — Progress Notes (Signed)
Physical Therapy Treatment Patient Details Name: Dana HuddleJosephine E Baldo MRN: [id number] DOB: 06-08-1929 Today's Date: 07/13/2015    History of Present Illness This patient is an 81 year old female who [facility] after a fall suffering a R hip fracture. She recieved an ORIF repair.    PT Comments    Pt with very little motivation this session, keeping eyes closed and not speaking despite Max encouragement and education re: role and benefit of PT for post surgical care and pulmonary benefits.  Used +2 assist from CNA to get pt sitting at EOB, pt able to sit and hold her balance and able to participate with sit<>stand transfer and ambulation to chair, but with the least amount of effort possible.  Cont with POC.  Follow Up Recommendations  SNF     Equipment Recommendations  Rolling walker with 5" wheels    Recommendations for Other Services       Precautions / Restrictions Restrictions Weight Bearing Restrictions: Yes RLE Weight Bearing: Weight bearing as tolerated Other Position/Activity Restrictions: s/p R femur ORIF    Mobility  Bed Mobility               General bed mobility comments: Supine to sit with Mod A +2; pt refusing to assist  Transfers                 General transfer comment: Sit<>stand with Mod A +2; both hands on RW  Ambulation/Gait             General Gait Details: bed to chair with Mod A +2, slumped posture, shuffles/drags feet; very little effort   Stairs            Wheelchair Mobility    Modified Rankin (Stroke Patients Only)       Balance               Standing balance comment: poor; needs Mod A to maintain                    Cognition Arousal/Alertness: Lethargic Behavior During Therapy: Flat affect;Agitated Overall Cognitive Status: History of cognitive impairments - at baseline                      Exercises      General Comments        Pertinent Vitals/Pain      Home Living                       Prior Function            PT Goals (current goals can now be found in the care plan section) Progress towards PT goals: Not progressing toward goals - comment (not fully participating)    Frequency  BID    PT Plan Current plan remains appropriate    Co-evaluation             End of Session Equipment Utilized During Treatment: Gait belt Activity Tolerance: Patient limited by lethargy;Treatment limited secondary to agitation Patient left: in chair;with call bell/phone within reach;with chair alarm set     Time: [id number] PT Time Calculation (min) (ACUTE ONLY): 33 min  Charges:  $Gait Training: 8-22 mins $Therapeutic Activity: 8-22 mins                    G Codes:      Catalea Labrecque A Kate Larock 07/13/2015, 12:30 PM

## 2015-07-13 NOTE — Discharge Summary (Signed)
[facility] [place] at [facility]   PATIENT NAME: Dana SmokerJosephine Riddle    MR#:  [id number]  DATE OF BIRTH:  10-08-1928  DATE OF ADMISSION:  07/09/2015 ADMITTING PHYSICIAN: Alford Highlandichard Wieting, MD  DATE OF DISCHARGE: 07/13/2015  PRIMARY CARE PHYSICIAN: Vonita MossMark Crissman, MD    ADMISSION DIAGNOSIS:  Hip fracture, right, closed, initial encounter (HCC) [S72.001A]  DISCHARGE DIAGNOSIS:  Active Problems:   Hip fracture (HCC)   Pneumonia   SECONDARY DIAGNOSIS:   Past Medical History  Diagnosis Date  . Diabetes mellitus without complication (HCC)   . Hyperlipidemia   . Hypertension   . Chronic kidney disease   . Alzheimer's dementia     HOSPITAL COURSE:   81 year old female with past medical history significant for Alzheimer's dementia, CK D, hypertension and [facility] after a fall and right hip fracture.  #1 Right hip fracture-secondary to mechanical fall -Appreciate orthopedics consult.  -s/p ORIF and POD# 3 -she needs to go to SNF at discharge -hemoglobin stable after surgery.  #2 Leukocytosis- CXR with new right lower lobe infiltrate - UA was normal twice - started levaquin, likely CAP vs aspiration  #3 diabetes mellitus- - patient on Januvia, lantus and empagliflozin as outpatient.  - poor intake and normal sugars now- cont to hold those meds  #4 Depression and anxiety-on Remeron and Klonopin.   #5 DVT prophylaxis- on lovenox-for 2 weeks postoperatively  #6 Urinary retention-  added flomax Needs in and out every 6 hours for now     DISCHARGE CONDITIONS:   Guarded  CONSULTS OBTAINED:  Treatment Team:  Donato HeinzJames P Hooten, MD  DRUG ALLERGIES:  No Known Allergies  DISCHARGE MEDICATIONS:   Current Discharge Medication List    START taking these medications   Details  enoxaparin (LOVENOX) 40 MG/0.4ML injection Inject 0.4 mLs (40 mg total) into the skin daily. Qty: 14 Syringe,  Refills: 0    ferrous sulfate 325 (65 FE) MG tablet Take 1 tablet (325 mg total) by mouth 2 (two) times daily with a meal. Qty: 30 tablet, Refills: 0    levofloxacin (LEVAQUIN) 250 MG tablet Take 1 tablet (250 mg total) by mouth daily. Qty: 6 tablet, Refills: 0    oxyCODONE (OXY IR/ROXICODONE) 5 MG immediate release tablet Take 1-2 tablets (5-10 mg total) by mouth every 4 (four) hours as needed for breakthrough pain ((for MODERATE breakthrough pain)). Qty: 30 tablet, Refills: 0    tamsulosin (FLOMAX) 0.4 MG CAPS capsule Take 1 capsule (0.4 mg total) by mouth daily. Qty: 30 capsule, Refills: 2    traMADol (ULTRAM) 50 MG tablet Take 1-2 tablets (50-100 mg total) by mouth every 4 (four) hours as needed for moderate pain. Qty: 30 tablet, Refills: 0      CONTINUE these medications which have CHANGED   Details  clonazePAM (KLONOPIN) 0.5 MG tablet Take 0.5 tablets (0.25 mg total) by mouth daily as needed (for agitation). Qty: 15 tablet, Refills: 0      CONTINUE these medications which have NOT CHANGED   Details  acetaminophen (TYLENOL) 500 MG tablet Take 500 mg by mouth every 6 (six) hours as needed for fever or headache.    aspirin EC 81 MG tablet Take 81 mg by mouth daily.    mirtazapine (REMERON) 15 MG tablet Take 15 mg by mouth at bedtime.     Vortioxetine HBr (TRINTELLIX) 10 MG TABS Take 10 mg by mouth at bedtime.     white  petrolatum (VASELINE) GEL Apply 1 application topically as needed for dry skin. Pt applies to legs.      STOP taking these medications     empagliflozin (JARDIANCE) 10 MG TABS tablet      insulin detemir (LEVEMIR) 100 UNIT/ML injection      sitaGLIPtin (JANUVIA) 50 MG tablet      nitrofurantoin, macrocrystal-monohydrate, (MACROBID) 100 MG capsule          DISCHARGE INSTRUCTIONS:   1. CBC and basic metabolic panel follow-up in 1 week 2. Please follow up in 2 weeks 3. Orthopedic follow-up with Dr. Ernest Pine in 2 weeks 4. Physical therapy  Staples  will need to be removed 2 weeks postop follow-up by the application of benzoin and Steri-Strips We will need to continue Lovenox 2 weeks following discharge to rehabilitation Change dressing as needed No showering until the staples are removed at 2 weeks postop  If you experience worsening of your admission symptoms, develop shortness of breath, life threatening emergency, suicidal or homicidal thoughts you must seek medical attention immediately by calling 911 or calling your MD immediately  if symptoms less severe.  You Must read complete instructions/literature along with all the possible adverse reactions/side effects for all the Medicines you take and that have been prescribed to you. Take any new Medicines after you have completely understood and accept all the possible adverse reactions/side effects.   Please note  You were cared for by a hospitalist during your hospital stay. If you have any questions about your discharge medications or the care you received while you were in the hospital after you are discharged, you can call the unit and asked to speak with the hospitalist on call if the hospitalist that took care of you is not available. Once you are discharged, your primary care physician will handle any further medical issues. Please note that NO REFILLS for any discharge medications will be authorized once you are discharged, as it is imperative that you return to your primary care physician (or establish a relationship with a primary care physician if you do not have one) for your aftercare needs so that they can reassess your need for medications and monitor your lab values.    Today   CHIEF COMPLAINT:  Patient still unable to urinate and requiring i and o  VITAL SIGNS:  Blood pressure 133/67, pulse 93, temperature 98.2 F (36.8 C), temperature source Oral, resp. rate 16, weight 47.628 kg (105 lb), SpO2 91 %.  I/O:    Intake/Output Summary (Last 24 hours) at 07/13/15  0857 Last data filed at 07/13/15 0445  Gross per 24 hour  Intake    370 ml  Output   1225 ml  Net   -855 ml    PHYSICAL EXAMINATION:   Physical Exam  GENERAL:  81 y.o.-year-old patient lying in the bed with no acute distress.  EYES: Pupils equal, round, reactive to light and accommodation. No scleral icterus. Extraocular muscles intact.  HEENT: Head atraumatic, normocephalic. Oropharynx and nasopharynx clear.  NECK:  Supple, no jugular venous distention. No thyroid enlargement, no tenderness.  LUNGS: Normal breath sounds bilaterally, no wheezing, rales,rhonchi or crepitation. No use of accessory muscles of respiration.  CARDIOVASCULAR: S1, S2 normal. No murmurs, rubs, or gallops.  ABDOMEN: Soft, non-tender, non-distended. Bowel sounds present. No organomegaly or mass.  EXTREMITIES: No pedal edema, cyanosis, or clubbing.  NEUROLOGIC: Cranial nerves II through XII are intact. Muscle strength 5/5 in all extremities. Sensation intact. Gait not checked.  PSYCHIATRIC:  The patient is alert and oriented x 3.  SKIN: No obvious rash, lesion, or ulcer.   DATA REVIEW:   CBC  Recent Labs Lab 07/13/15 0412  WBC 15.2*  HGB 10.8*  HCT 32.5*  PLT 336    Chemistries   Recent Labs Lab 07/09/15 1336  07/13/15 0412  NA 131*  < > 139  K 4.1  < > 4.2  CL 98*  < > 107  CO2 28  < > 23  GLUCOSE 118*  < > 97  BUN 11  < > 20  CREATININE 0.97  < > 0.80  CALCIUM 8.3*  < > 8.0*  AST 17  --   --   ALT 12*  --   --   ALKPHOS 83  --   --   BILITOT 0.4  --   --   < > = values in this interval not displayed.  Cardiac Enzymes No results for input(s): TROPONINI in the last 168 hours.  Microbiology Results    RADIOLOGY:  Dg Chest 2 View  07/11/2015  CLINICAL DATA:  Leukocytosis EXAM: CHEST - 2 VIEW COMPARISON:  07/09/2015 FINDINGS: Cardiac shadow is stable. Aortic calcifications are again seen and stable. Mild interstitial changes are again noted. Increased density is noted projecting in  the right lung base on the lateral projection consistent with acute infiltrate. No bony abnormality is seen. IMPRESSION: New right lower lobe infiltrate. Electronically Signed   By: Alcide Clever M.D.   On: 07/11/2015 14:25    EKG:       Management plans discussed with the patient  CODE STATUS:     Code Status Orders        Start     Ordered   07/10/15 2214  Full code   Continuous     07/10/15 2213      TOTAL TIME TAKING CARE OF THIS PATIENT: 35 minutes.    Beza Steppe M.D on 07/13/2015 at 8:57 AM  Between 7am to 6pm - Pager - [phone]  After 6pm go to [email or web address] - password [email or web address] [facility]  [place] [facility]  Office  [phone]  CC: Primary care physician; Vonita Moss, MD

## 2015-07-13 NOTE — Progress Notes (Signed)
   Subjective: 3 Days Post-Op Procedure(s) (LRB): INTRAMEDULLARY (IM) NAIL INTERTROCHANTRIC (Right) Patient reports pain as 0 on 0-10 scale.   Patient is well, and has had no acute complaints or problems We will continue therapy today.  Plan is to go Rehab today no nausea and no vomiting Patient denies any chest pains or shortness of breath.  Objective: Vital signs in last 24 hours: Temp:  [97.4 F (36.3 C)-98.4 F (36.9 C)] 98 F (36.7 C) (10/15 0436) Pulse Rate:  [83-89] 88 (10/15 0436) Resp:  [16-18] 18 (10/15 0436) BP: (105-136)/(49-68) 136/68 mmHg (10/15 0436) SpO2:  [94 %-95 %] 95 % (10/15 0436)  Intake/Output from previous day: 10/14 0701 - 10/15 0700 In: 370 [P.O.:120; IV Piggyback:250] Out: 1225 [Urine:1225] Intake/Output this shift:     Recent Labs  07/11/15 0537 07/12/15 0701 07/13/15 0412  HGB 11.1* 11.1* 10.8*    Recent Labs  07/12/15 0701 07/13/15 0412  WBC 24.1* 15.2*  RBC 3.57* 3.46*  HCT 33.2* 32.5*  PLT 371 336    Recent Labs  07/12/15 0701 07/13/15 0412  NA 137 139  K 4.0 4.2  CL 105 107  CO2 23 23  BUN 26* 20  CREATININE 1.02* 0.80  GLUCOSE 127* 97  CALCIUM 7.9* 8.0*   No results for input(s): LABPT, INR in the last 72 hours.  EXAM General - Patient is Alert, Appropriate and Confused Extremity - Neurologically intact Neurovascular intact Sensation intact distally Intact pulses distally Dorsiflexion/Plantar flexion intact well approximated incision . The honeycomb dressing intact. The right leg is slightly flexed but able to extend as needed.  Dressing - dressing C/D/I Motor Function - intact, moving foot and toes well on exam.   Past Medical History  Diagnosis Date  . Diabetes mellitus without complication (HCC)   . Hyperlipidemia   . Hypertension   . Chronic kidney disease   . Alzheimer's dementia     Assessment/Plan: 3 Days Post-Op Procedure(s) (LRB): INTRAMEDULLARY (IM) NAIL INTERTROCHANTRIC (Right) Active  Problems:   Hip fracture (HCC)   Pneumonia  Estimated body mass index is 19.15 kg/(m^2) as calculated from the following:   Height as of 04/04/15: 5' 2.1" (1.577 m).   Weight as of this encounter: 47.628 kg (105 lb). Advance diet Up with therapy D/C IV fluids Discharge to SNF Today Patient [facility] 6 weeks with Dr. Durward FortesHooten Staples will need to be removed 2 weeks postop follow-up by the application of benzoin and Steri-Strips We will need to continue Lovenox 2 weeks following discharge to rehabilitation Change dressing as needed No showering until the staples are removed at 2 weeks postop  DVT Prophylaxis - Lovenox, Foot Pumps and TED hose Weight-Bearing as tolerated to right leg    Amador Cunashomas Doretta Remmert PA-C [facility] 07/13/2015, 7:55 AM

## 2015-07-13 NOTE — Clinical Social Work Note (Signed)
Patient to discharge today to [facility] as planned. Discharge info sent yesterday by CSW. Nurse to call report. Patient to transport via EMS. CSW has contacted and left message for patient's DSS Guardian: Kay: [phone]. York SpanielMonica Zhoey Blackstock MSW,LCSW [phone]

## 2015-07-15 NOTE — Anesthesia Postprocedure Evaluation (Signed)
  Anesthesia Post-op Note  Patient: Dana HuddleJosephine E Riddle  Procedure(s) Performed: Procedure(s): INTRAMEDULLARY (IM) NAIL INTERTROCHANTRIC (Right)  Anesthesia type:Spinal  Patient location:   Post pain: Pain level controlled  Post assessment: Post-op Vital signs reviewed, Patient's Cardiovascular Status Stable, Respiratory Function Stable, Patent Airway and No signs of Nausea or vomiting  Post vital signs: Reviewed and stable  Last Vitals:  Filed Vitals:   07/13/15 0811  BP: 133/67  Pulse: 93  Temp: 36.8 C  Resp: 16    Level of consciousness: awake, alert  and patient cooperative  Complications: No apparent anesthesia complications per notes. Pt not seen before discharge.

## 2015-07-17 LAB — GLUCOSE, CAPILLARY
GLUCOSE-CAPILLARY: 78 mg/dL (ref 65–99)
Glucose-Capillary: 94 mg/dL (ref 65–99)

## 2015-07-18 NOTE — Patient Outreach (Signed)
[facility] Care Management  07/18/2015  Dana HuddleJosephine E [place] Feb 19, 1929 [id number]   Referral from Susette RacerJulie Montpellier, RN to assign Community RN, assigned Ma RingsJanci Minor, Charity fundraiserN.  Thanks, Corrie MckusickLisa O. Sharlee BlewMoore, [facility] CM Assistant Phone: [phone] Fax: [phone]

## 2015-07-23 NOTE — Patient Outreach (Signed)
[facility]N) Care Management  07/23/2015  Josetta HuddleJosephine E [place] 03-04-1929 [id number]   Notification from Janci Minor, RN to assign SW due to SNF stay, assigned Chrystal Land, LCSW.  Thanks, Corrie MckusickLisa O. Sharlee BlewMoore, AABA [facility] Care Management [facility] CM Assistant Phone: [phone] Fax: [phone]

## 2015-07-29 ENCOUNTER — Ambulatory Visit: Payer: Self-pay | Admitting: *Deleted

## 2015-07-31 ENCOUNTER — Other Ambulatory Visit: Payer: Self-pay | Admitting: *Deleted

## 2015-07-31 NOTE — Patient Outreach (Addendum)
[facility]) Care Management  07/31/2015  Josetta HuddleJosephine E Carvell 08/16/1929 [id number]   Phone call to discharge planner at [facility] [place] on 07/29/15 at 11;30am  for an update on patient.  Per Shanda BumpsJessica patient will be returning to Assisted Living post discharge from [facility] on 08/01/15.  [facility]   Patient to discharge with HH.    Adriana ReamsChrystal Nickalus Thornsberry, LCSW [facility] Care Management [phone]

## 2015-08-01 ENCOUNTER — Other Ambulatory Visit: Payer: Self-pay | Admitting: *Deleted

## 2015-08-01 NOTE — Patient Outreach (Addendum)
  [facility]) Care Management  [facility]  08/01/2015  Dana HuddleJosephine E [place]m 1928/11/01 [id number]  Subjective:  Patient is a 81 year old female.  Objective:   Current Medications:  Current Outpatient Prescriptions  Medication Sig Dispense Refill  . acetaminophen (TYLENOL) 500 MG tablet Take 500 mg by mouth every 6 (six) hours as needed for fever or headache.    Marland Kitchen. aspirin EC 81 MG tablet Take 81 mg by mouth daily.    . clonazePAM (KLONOPIN) 0.5 MG tablet Take 0.5 tablets (0.25 mg total) by mouth daily as needed (for agitation). 15 tablet 0  . enoxaparin (LOVENOX) 40 MG/0.4ML injection Inject 0.4 mLs (40 mg total) into the skin daily. 14 Syringe 0  . ferrous sulfate 325 (65 FE) MG tablet Take 1 tablet (325 mg total) by mouth 2 (two) times daily with a meal. 30 tablet 0  . mirtazapine (REMERON) 15 MG tablet Take 15 mg by mouth at bedtime.     Marland Kitchen. oxyCODONE (OXY IR/ROXICODONE) 5 MG immediate release tablet Take 1-2 tablets (5-10 mg total) by mouth every 4 (four) hours as needed for breakthrough pain ((for MODERATE breakthrough pain)). 30 tablet 0  . tamsulosin (FLOMAX) 0.4 MG CAPS capsule Take 1 capsule (0.4 mg total) by mouth daily. 30 capsule 2  . traMADol (ULTRAM) 50 MG tablet Take 1-2 tablets (50-100 mg total) by mouth every 4 (four) hours as needed for moderate pain. 30 tablet 0  . Vortioxetine HBr (TRINTELLIX) 10 MG TABS Take 10 mg by mouth at bedtime.     . white petrolatum (VASELINE) GEL Apply 1 application topically as needed for dry skin. Pt applies to legs.     No current facility-administered medications for this visit.    Functional Status:  In your present state of health, do you have any difficulty performing the following activities: 07/10/2015 07/10/2015  Hearing? - Y  Vision? - N  Difficulty concentrating or making decisions? - Y  Walking or climbing stairs? - Y  Dressing or bathing? - Y  Doing errands, shopping? Malvin JohnsY Y    Fall/Depression Screening:   No flowsheet data found.  Assessment:  This social worker visited [facility] at [facility] before her scheduled discharge [facility]02/[facility].  Patient's chart reviewed.  Patient admitted to [facility] following a fall- right hip fracture.  Per EPIC notes patient has been assigned a guardian through the Department of Social Lujean RaveServices-Kay Adrian [phone].    Patient laying in bed when the social worker arrived.  Patient alert, smiling, oriented to self only. Per EPIC notes, patient has a history of Alzheimer's Dementia.  Patient did not mention having a guardian only a nephew Alecia LemmingVictor.  [facility] services discussed, patient agreed to services, however this social worker will  discuss [facility] services and follow up  with guardian as well.   Care [facility] confirmed by discharge planner to be arranged for patient post discharge.  Plan:  This Child psychotherapistsocial worker will collaborate with patient's guardian through the Department of Social Services.             This Child psychotherapistsocial worker will collaborate with RNCM.

## 2015-08-02 ENCOUNTER — Other Ambulatory Visit: Payer: Self-pay | Admitting: *Deleted

## 2015-08-02 NOTE — Patient Outreach (Signed)
[facility]) Care Management  08/02/2015  Josetta HuddleJosephine E Eppard [birth year] [id number]   Phone call to Arvella MerlesKay Adrian, patient's guardian who has respectfully declined [facility] services at this time stating that she will be [facility] with a home health agency that she feels confident will provide comprehensive services for patient.  This social worker explained the difference between home health services and [facility] services as well, however patient's guardian felt confident that the home health agency chosen would be able to take care of patient's needs.  Patient's case to be closed to [facility] services at this time.  RNCM notified.    Adriana ReamsChrystal Land, LCSW [facility] Care Management [phone]

## 2015-08-06 ENCOUNTER — Ambulatory Visit: Payer: Medicare Other | Admitting: Family Medicine

## 2015-08-07 NOTE — Patient Outreach (Signed)
[facility]) Care Management  08/07/2015  Dana Riddle 03/21/1929 [id number]   Notification from [facility] Land, LCSW to close case due to patients caregiver refused [facility] services.  Thanks, Corrie MckusickLisa O. Sharlee BlewMoore, AABA [facility] Care Management [facility] CM Assistant Phone: [phone] Fax: [phone]

## 2015-08-11 ENCOUNTER — Inpatient Hospital Stay: Payer: Medicare Other

## 2015-08-11 ENCOUNTER — Emergency Department: Payer: Medicare Other

## 2015-08-11 ENCOUNTER — Inpatient Hospital Stay
Admission: EM | Admit: 2015-08-11 | Discharge: 2015-08-16 | DRG: 480 | Disposition: A | Discharge status: Skilled Nursing Facility | Payer: Medicare Other | Attending: Internal Medicine | Admitting: Internal Medicine

## 2015-08-11 DIAGNOSIS — E785 Hyperlipidemia, unspecified: Secondary | ICD-10-CM

## 2015-08-11 DIAGNOSIS — I129 Hypertensive chronic kidney disease with stage 1 through stage 4 chronic kidney disease, or unspecified chronic kidney disease: Secondary | ICD-10-CM | POA: Diagnosis present

## 2015-08-11 DIAGNOSIS — E43 Unspecified severe protein-calorie malnutrition: Secondary | ICD-10-CM | POA: Diagnosis present

## 2015-08-11 DIAGNOSIS — Z7982 Long term (current) use of aspirin: Secondary | ICD-10-CM

## 2015-08-11 DIAGNOSIS — G309 Alzheimer's disease, unspecified: Secondary | ICD-10-CM | POA: Diagnosis present

## 2015-08-11 DIAGNOSIS — Z7901 Long term (current) use of anticoagulants: Secondary | ICD-10-CM

## 2015-08-11 DIAGNOSIS — Z681 Body mass index (BMI) 19 or less, adult: Secondary | ICD-10-CM

## 2015-08-11 DIAGNOSIS — Z79891 Long term (current) use of opiate analgesic: Secondary | ICD-10-CM

## 2015-08-11 DIAGNOSIS — E119 Type 2 diabetes mellitus without complications: Secondary | ICD-10-CM | POA: Diagnosis present

## 2015-08-11 DIAGNOSIS — S72002A Fracture of unspecified part of neck of left femur, initial encounter for closed fracture: Secondary | ICD-10-CM | POA: Diagnosis present

## 2015-08-11 DIAGNOSIS — W19XXXA Unspecified fall, initial encounter: Secondary | ICD-10-CM | POA: Diagnosis present

## 2015-08-11 DIAGNOSIS — I959 Hypotension, unspecified: Secondary | ICD-10-CM | POA: Diagnosis present

## 2015-08-11 DIAGNOSIS — Z79899 Other long term (current) drug therapy: Secondary | ICD-10-CM

## 2015-08-11 DIAGNOSIS — N182 Chronic kidney disease, stage 2 (mild): Secondary | ICD-10-CM | POA: Diagnosis present

## 2015-08-11 DIAGNOSIS — D638 Anemia in other chronic diseases classified elsewhere: Secondary | ICD-10-CM | POA: Diagnosis present

## 2015-08-11 DIAGNOSIS — Z794 Long term (current) use of insulin: Secondary | ICD-10-CM | POA: Diagnosis not present

## 2015-08-11 DIAGNOSIS — N183 Chronic kidney disease, stage 3 (moderate): Secondary | ICD-10-CM | POA: Diagnosis present

## 2015-08-11 DIAGNOSIS — R8281 Pyuria: Secondary | ICD-10-CM | POA: Diagnosis present

## 2015-08-11 DIAGNOSIS — S72142A Displaced intertrochanteric fracture of left femur, initial encounter for closed fracture: Secondary | ICD-10-CM | POA: Diagnosis present

## 2015-08-11 DIAGNOSIS — E222 Syndrome of inappropriate secretion of antidiuretic hormone: Secondary | ICD-10-CM | POA: Diagnosis present

## 2015-08-11 DIAGNOSIS — F0280 Dementia in other diseases classified elsewhere without behavioral disturbance: Secondary | ICD-10-CM | POA: Diagnosis present

## 2015-08-11 DIAGNOSIS — N3090 Cystitis, unspecified without hematuria: Secondary | ICD-10-CM | POA: Diagnosis present

## 2015-08-11 DIAGNOSIS — E860 Dehydration: Secondary | ICD-10-CM | POA: Diagnosis present

## 2015-08-11 DIAGNOSIS — R1110 Vomiting, unspecified: Secondary | ICD-10-CM | POA: Diagnosis present

## 2015-08-11 DIAGNOSIS — D649 Anemia, unspecified: Secondary | ICD-10-CM

## 2015-08-11 DIAGNOSIS — Z419 Encounter for procedure for purposes other than remedying health state, unspecified: Secondary | ICD-10-CM

## 2015-08-11 DIAGNOSIS — N390 Urinary tract infection, site not specified: Secondary | ICD-10-CM

## 2015-08-11 DIAGNOSIS — E871 Hypo-osmolality and hyponatremia: Secondary | ICD-10-CM | POA: Diagnosis present

## 2015-08-11 DIAGNOSIS — Y929 Unspecified place or not applicable: Secondary | ICD-10-CM

## 2015-08-11 LAB — CBC
HEMATOCRIT: 32.3 % — AB (ref 35.0–47.0)
Hemoglobin: 10.7 g/dL — ABNORMAL LOW (ref 12.0–16.0)
MCH: 30.3 pg (ref 26.0–34.0)
MCHC: 33 g/dL (ref 32.0–36.0)
MCV: 91.7 fL (ref 80.0–100.0)
Platelets: 333 10*3/uL (ref 150–440)
RBC: 3.52 MIL/uL — ABNORMAL LOW (ref 3.80–5.20)
RDW: 14.5 % (ref 11.5–14.5)
WBC: 13.7 10*3/uL — AB (ref 3.6–11.0)

## 2015-08-11 LAB — URINALYSIS COMPLETE WITH MICROSCOPIC (ARMC ONLY)
BACTERIA UA: NONE SEEN
Bilirubin Urine: NEGATIVE
Glucose, UA: 50 mg/dL — AB
KETONES UR: NEGATIVE mg/dL
NITRITE: NEGATIVE
PH: 5 (ref 5.0–8.0)
PROTEIN: 30 mg/dL — AB
SPECIFIC GRAVITY, URINE: 1.015 (ref 1.005–1.030)

## 2015-08-11 LAB — COMPREHENSIVE METABOLIC PANEL
ALBUMIN: 2.9 g/dL — AB (ref 3.5–5.0)
ALT: 9 U/L — ABNORMAL LOW (ref 14–54)
AST: 11 U/L — AB (ref 15–41)
Alkaline Phosphatase: 188 U/L — ABNORMAL HIGH (ref 38–126)
Anion gap: 8 (ref 5–15)
BILIRUBIN TOTAL: 1.2 mg/dL (ref 0.3–1.2)
BUN: 26 mg/dL — AB (ref 6–20)
CHLORIDE: 91 mmol/L — AB (ref 101–111)
CO2: 26 mmol/L (ref 22–32)
Calcium: 8.5 mg/dL — ABNORMAL LOW (ref 8.9–10.3)
Creatinine, Ser: 0.88 mg/dL (ref 0.44–1.00)
GFR calc Af Amer: >60 mL/min (ref >60)
GFR calc non Af Amer: 59 mL/min — ABNORMAL LOW (ref >60)
GLUCOSE: 252 mg/dL — AB (ref 65–99)
POTASSIUM: 4.6 mmol/L (ref 3.5–5.1)
SODIUM: 125 mmol/L — AB (ref 135–145)
TOTAL PROTEIN: 6.9 g/dL (ref 6.5–8.1)

## 2015-08-11 LAB — TROPONIN I: Troponin I: <0.03 ng/mL (ref <0.031)

## 2015-08-11 LAB — SURGICAL PCR SCREEN
MRSA, PCR: NEGATIVE
STAPHYLOCOCCUS AUREUS: NEGATIVE

## 2015-08-11 LAB — GLUCOSE, CAPILLARY
GLUCOSE-CAPILLARY: 228 mg/dL — AB (ref 65–99)
Glucose-Capillary: 189 mg/dL — ABNORMAL HIGH (ref 65–99)

## 2015-08-11 LAB — MAGNESIUM: MAGNESIUM: 1.7 mg/dL (ref 1.7–2.4)

## 2015-08-11 LAB — LIPASE, BLOOD: Lipase: 23 U/L (ref 11–51)

## 2015-08-11 MED ORDER — FERROUS SULFATE 325 (65 FE) MG PO TABS
325.0000 mg | ORAL_TABLET | Freq: Two times a day (BID) | ORAL | Status: DC
  Administered 2015-08-11 – 2015-08-16 (×7): 325 mg via ORAL
  Filled 2015-08-11 (×7): qty 1

## 2015-08-11 MED ORDER — TAMSULOSIN HCL 0.4 MG PO CAPS
0.4000 mg | ORAL_CAPSULE | Freq: Every day | ORAL | Status: DC
  Administered 2015-08-14 – 2015-08-16 (×3): 0.4 mg via ORAL
  Filled 2015-08-11 (×3): qty 1

## 2015-08-11 MED ORDER — ONDANSETRON HCL 4 MG/2ML IJ SOLN
4.0000 mg | Freq: Four times a day (QID) | INTRAMUSCULAR | Status: DC | PRN
  Administered 2015-08-13: 4 mg via INTRAVENOUS
  Filled 2015-08-11: qty 2

## 2015-08-11 MED ORDER — MIRTAZAPINE 15 MG PO TABS
15.0000 mg | ORAL_TABLET | Freq: Every day | ORAL | Status: DC
  Administered 2015-08-11 – 2015-08-15 (×5): 15 mg via ORAL
  Filled 2015-08-11 (×5): qty 1

## 2015-08-11 MED ORDER — SODIUM CHLORIDE 0.9 % IV BOLUS (SEPSIS)
1000.0000 mL | Freq: Once | INTRAVENOUS | Status: AC
  Administered 2015-08-11: 1000 mL via INTRAVENOUS

## 2015-08-11 MED ORDER — SODIUM CHLORIDE 0.9 % IV BOLUS (SEPSIS)
500.0000 mL | Freq: Once | INTRAVENOUS | Status: AC
  Administered 2015-08-11: 500 mL via INTRAVENOUS

## 2015-08-11 MED ORDER — CLONAZEPAM 0.5 MG PO TABS
0.2500 mg | ORAL_TABLET | Freq: Every day | ORAL | Status: DC | PRN
  Administered 2015-08-12: 0.25 mg via ORAL
  Filled 2015-08-11: qty 1

## 2015-08-11 MED ORDER — VORTIOXETINE HBR 10 MG PO TABS
10.0000 mg | ORAL_TABLET | Freq: Every day | ORAL | Status: DC
  Administered 2015-08-11 – 2015-08-15 (×5): 10 mg via ORAL
  Filled 2015-08-11 (×6): qty 1

## 2015-08-11 MED ORDER — ALBUTEROL SULFATE (2.5 MG/3ML) 0.083% IN NEBU: 2.5000 mg | INHALATION_SOLUTION | RESPIRATORY_TRACT | Status: DC | PRN

## 2015-08-11 MED ORDER — INSULIN ASPART 100 UNIT/ML ~~LOC~~ SOLN
0.0000 [IU] | Freq: Three times a day (TID) | SUBCUTANEOUS | Status: DC
  Administered 2015-08-11: 2 [IU] via SUBCUTANEOUS
  Administered 2015-08-12: 3 [IU] via SUBCUTANEOUS
  Administered 2015-08-13: 1 [IU] via SUBCUTANEOUS
  Administered 2015-08-14: 2 [IU] via SUBCUTANEOUS
  Administered 2015-08-15: 3 [IU] via SUBCUTANEOUS
  Administered 2015-08-15: 1 [IU] via SUBCUTANEOUS
  Administered 2015-08-15 – 2015-08-16 (×2): 2 [IU] via SUBCUTANEOUS
  Filled 2015-08-11: qty 2
  Filled 2015-08-11: qty 4
  Filled 2015-08-11: qty 3
  Filled 2015-08-11: qty 1
  Filled 2015-08-11: qty 2
  Filled 2015-08-11: qty 3
  Filled 2015-08-11 (×2): qty 1
  Filled 2015-08-11: qty 2

## 2015-08-11 MED ORDER — ONDANSETRON HCL 4 MG/2ML IJ SOLN
4.0000 mg | Freq: Once | INTRAMUSCULAR | Status: AC
  Administered 2015-08-11: 4 mg via INTRAVENOUS
  Filled 2015-08-11: qty 2

## 2015-08-11 MED ORDER — ACETAMINOPHEN 650 MG RE SUPP: 650.0000 mg | Freq: Four times a day (QID) | RECTAL | Status: DC | PRN

## 2015-08-11 MED ORDER — INSULIN DETEMIR 100 UNIT/ML ~~LOC~~ SOLN
10.0000 [IU] | SUBCUTANEOUS | Status: DC
  Administered 2015-08-12 – 2015-08-16 (×3): 10 [IU] via SUBCUTANEOUS
  Filled 2015-08-11 (×5): qty 0.1

## 2015-08-11 MED ORDER — ONDANSETRON HCL 4 MG PO TABS
4.0000 mg | ORAL_TABLET | Freq: Four times a day (QID) | ORAL | Status: DC | PRN
  Administered 2015-08-14: 4 mg via ORAL
  Filled 2015-08-11: qty 1

## 2015-08-11 MED ORDER — INSULIN DETEMIR 100 UNIT/ML FLEXPEN: 10.0000 [IU] | PEN_INJECTOR | SUBCUTANEOUS | Status: DC

## 2015-08-11 MED ORDER — ACETAMINOPHEN 325 MG PO TABS
650.0000 mg | ORAL_TABLET | Freq: Four times a day (QID) | ORAL | Status: DC | PRN
  Administered 2015-08-15 (×2): 650 mg via ORAL
  Filled 2015-08-11 (×3): qty 2

## 2015-08-11 MED ORDER — OXYCODONE HCL 5 MG PO TABS
5.0000 mg | ORAL_TABLET | ORAL | Status: DC | PRN
  Administered 2015-08-11 – 2015-08-14 (×4): 5 mg via ORAL
  Filled 2015-08-11 (×5): qty 1

## 2015-08-11 MED ORDER — INSULIN ASPART 100 UNIT/ML ~~LOC~~ SOLN
0.0000 [IU] | Freq: Every day | SUBCUTANEOUS | Status: DC
  Administered 2015-08-11 – 2015-08-15 (×2): 2 [IU] via SUBCUTANEOUS
  Filled 2015-08-11 (×2): qty 2

## 2015-08-11 MED ORDER — DEXTROSE 5 % IV SOLN
1.0000 g | INTRAVENOUS | Status: DC
  Administered 2015-08-12 – 2015-08-14 (×3): 1 g via INTRAVENOUS
  Filled 2015-08-11 (×3): qty 10

## 2015-08-11 MED ORDER — DEXTROSE 5 % IV SOLN
1.0000 g | Freq: Once | INTRAVENOUS | Status: AC
  Administered 2015-08-11: 1 g via INTRAVENOUS
  Filled 2015-08-11: qty 10

## 2015-08-11 MED ORDER — SODIUM CHLORIDE 0.9 % IV SOLN
INTRAVENOUS | Status: DC
  Administered 2015-08-11 – 2015-08-13 (×3): via INTRAVENOUS

## 2015-08-11 MED ORDER — NYSTATIN 100000 UNIT/GM EX CREA
TOPICAL_CREAM | Freq: Two times a day (BID) | CUTANEOUS | Status: DC
  Administered 2015-08-11 – 2015-08-16 (×9): via TOPICAL
  Filled 2015-08-11: qty 15

## 2015-08-11 MED ORDER — MORPHINE SULFATE (PF) 4 MG/ML IV SOLN
4.0000 mg | INTRAVENOUS | Status: DC | PRN
  Administered 2015-08-13 – 2015-08-14 (×2): 4 mg via INTRAVENOUS
  Filled 2015-08-11 (×2): qty 1

## 2015-08-11 NOTE — Consult Note (Addendum)
New left high intertrochanteric hip fracture, repair when medically able. On exam the left leg is very painful to palpation, skin anterior and lateral intact. Pain with any motion of the leg. She is able flex extend the toes.  X-rays show proximal intertrochanteric fracture with mild displacement.  Impression left intertrochanteric hip fracture the patient with recent right hip fracture unknown injury on this side and presumed secondary to confusion she does not know when this occurred.  Plan is for ORIF with IM rod when medically stable

## 2015-08-11 NOTE — H&P (Addendum)
[facility] [place] [facility]   PATIENT NAME: Dana Riddle    MR#:  [id number]  DATE OF BIRTH:  09-17-1929  DATE OF ADMISSION:  08/11/2015  PRIMARY CARE PHYSICIAN: Vonita MossMark Crissman, MD   REQUESTING/REFERRING PHYSICIAN: Sharyn CreamerMark Quale, MD  CHIEF COMPLAINT:   Chief Complaint  Patient presents with  . Emesis   emesis today.  HISTORY OF PRESENT ILLNESS:  Dana Riddle  is a 81 y.o. female with a known history of recent right hip fracture, hypertension, diabetes, CKD and Alzheimer's dementia. The patient was sent from nursing home due to emesis today. The patient is demented and only know her name, she is unable to provide any information. She denies any symptoms except the left hip pain. She was found to have left hip fracture and UTI the ED, was treated with Rocephin.  PAST MEDICAL HISTORY:   Past Medical History  Diagnosis Date  . Diabetes mellitus without complication (HCC)   . Hyperlipidemia   . Hypertension   . Chronic kidney disease   . Alzheimer's dementia     PAST SURGICAL HISTORY:   Past Surgical History  Procedure Laterality Date  . Intramedullary (im) nail intertrochanteric Right 07/10/2015    Procedure: INTRAMEDULLARY (IM) NAIL INTERTROCHANTRIC;  Surgeon: Donato HeinzJames P Hooten, MD;  Location: [facility] ORS;  Service: Orthopedics;  Laterality: Right;    SOCIAL HISTORY:   Social History  Substance Use Topics  . Smoking status: Current Every Day Smoker -- 1.00 packs/day  . Smokeless tobacco: Never Used  . Alcohol Use: No    FAMILY HISTORY:   Family History  Problem Relation Age of Onset  . Cancer Father     lung    DRUG ALLERGIES:  No Known Allergies  REVIEW OF SYSTEMS:  The patient is demented, unable to provide ROS.  MEDICATIONS AT HOME:   Prior to Admission medications   Medication Sig Start Date End Date Taking? Authorizing Provider  acetaminophen (TYLENOL) 500 MG tablet Take 500 mg by mouth every 6 (six) hours as needed for  fever or headache.   Yes Historical Provider, MD  aspirin EC 81 MG tablet Take 81 mg by mouth daily.   Yes Historical Provider, MD  clonazePAM (KLONOPIN) 0.5 MG tablet Take 0.5 tablets (0.25 mg total) by mouth daily as needed (for agitation). Patient taking differently: Take 0.25 mg by mouth 2 (two) times daily as needed for anxiety (for agitation).  07/12/15  Yes Enid Baasadhika Kalisetti, MD  ferrous sulfate 325 (65 FE) MG tablet Take 1 tablet (325 mg total) by mouth 2 (two) times daily with a meal. 07/12/15  Yes Enid Baasadhika Kalisetti, MD  Insulin Detemir (LEVEMIR FLEXTOUCH) 100 UNIT/ML Pen Inject 10 Units into the skin every morning.   Yes Historical Provider, MD  mirtazapine (REMERON) 15 MG tablet Take 15 mg by mouth at bedtime.    Yes Historical Provider, MD  oxyCODONE (OXY IR/ROXICODONE) 5 MG immediate release tablet Take 1-2 tablets (5-10 mg total) by mouth every 4 (four) hours as needed for breakthrough pain ((for MODERATE breakthrough pain)). Patient taking differently: Take 5 mg by mouth every 4 (four) hours as needed for moderate pain, severe pain or breakthrough pain ((for MODERATE breakthrough pain)).  07/12/15  Yes Dedra Skeensodd Mundy, PA-C  Vortioxetine HBr (TRINTELLIX) 10 MG TABS Take 10 mg by mouth at bedtime.    Yes Historical Provider, MD  white petrolatum (VASELINE) GEL Apply 1 application topically as needed for dry skin. Pt applies to legs.   Yes  Historical Provider, MD  enoxaparin (LOVENOX) 40 MG/0.4ML injection Inject 0.4 mLs (40 mg total) into the skin daily. 07/12/15   Enid Baas, MD  tamsulosin (FLOMAX) 0.4 MG CAPS capsule Take 1 capsule (0.4 mg total) by mouth daily. 07/12/15   Enid Baas, MD  traMADol (ULTRAM) 50 MG tablet Take 1-2 tablets (50-100 mg total) by mouth every 4 (four) hours as needed for moderate pain. 07/12/15   Dedra Skeens, PA-C      VITAL SIGNS:  Blood pressure 104/52, pulse 91, temperature 97.2 F (36.2 C), temperature source Oral, resp. rate 17, SpO2 100  %.  PHYSICAL EXAMINATION:  GENERAL:  81 y.o.-year-old patient lying in the bed with no acute distress.  EYES: Pupils equal, round, reactive to light and accommodation. No scleral icterus. Extraocular muscles intact.  HEENT: Head atraumatic, normocephalic. Oropharynx and nasopharynx clear. Dry oral mucosa. NECK:  Supple, no jugular venous distention. No thyroid enlargement, no tenderness.  LUNGS: Normal breath sounds bilaterally, no wheezing, rales,rhonchi or crepitation. No use of accessory muscles of respiration.  CARDIOVASCULAR: S1, S2 normal. No murmurs, rubs, or gallops.  ABDOMEN: Soft, nontender, nondistended. Bowel sounds present. No organomegaly or mass.  EXTREMITIES: No pedal edema, cyanosis, or clubbing. Left lower extremity left rotation deformity. NEUROLOGIC: Cranial nerves II through XII are intact. Muscle strength 5/5 in all extremities left lower extremity. Sensation intact. Gait not checked.  PSYCHIATRIC: The patient is demented.  SKIN: No obvious rash, lesion, or ulcer.   LABORATORY PANEL:   CBC  Recent Labs Lab 08/11/15 1134  WBC 13.7*  HGB 10.7*  HCT 32.3*  PLT 333   ------------------------------------------------------------------------------------------------------------------  Chemistries   Recent Labs Lab 08/11/15 1134  NA 125*  K 4.6  CL 91*  CO2 26  GLUCOSE 252*  BUN 26*  CREATININE 0.88  CALCIUM 8.5*  AST 11*  ALT 9*  ALKPHOS 188*  BILITOT 1.2   ------------------------------------------------------------------------------------------------------------------  Cardiac Enzymes  Recent Labs Lab 08/11/15 1134  TROPONINI <0.03   ------------------------------------------------------------------------------------------------------------------  RADIOLOGY:  Dg Abd 1 View  08/11/2015  CLINICAL DATA:  Nursing outpatient, intermittent vomiting for 2 days. Diabetes. EXAM: ABDOMEN - 1 VIEW COMPARISON:  07/11/2015 FINDINGS: Scattered air and  stool throughout the bowel without significant dilatation, ileus pattern or obstruction. Prior right hip fixation for a remote intertrochanteric fracture. Included imaging of the left hip demonstrates a proximal left femoral neck fracture with displacement. This is new since 07/09/2015. Consider dedicated left hip radiographs. Degenerative changes of the spine with a mild levoscoliosis. IMPRESSION: Nonobstructive bowel gas pattern. Previous right hip fixation hardware Acute to subacute proximal left hip femoral neck fracture. Recommend dedicated left hip series. This is new since 07/09/2015. Electronically Signed   By: Judie Petit.  Shick M.D.   On: 08/11/2015 11:58   Dg Hip Unilat With Pelvis 2-3 Views Left  08/11/2015  CLINICAL DATA:  Left hip pain post fall today. EXAM: DG HIP (WITH OR WITHOUT PELVIS) 2-3V LEFT COMPARISON:  07/09/2015 and 07/10/2015 FINDINGS: There are mild symmetric degenerative changes of the hips. Fixation hardware over the right proximal femur is intact fixating recent intertrochanteric fracture with anatomic alignment over the fracture site. There is a new displaced left trochanteric fracture with mild comminution. Exaggerated coxa vera deformity and mild impaction about the fracture site. Remainder the exam is unchanged. IMPRESSION: New displaced slightly comminuted left intertrochanteric fracture. Fixation hardware intact bridging recent right intertrochanteric fracture with anatomic alignment about the fracture site. Electronically Signed   By: Elberta Fortis M.D.  On: 08/11/2015 14:18    EKG:   Orders placed or performed during the hospital encounter of 08/11/15  . ED EKG  . ED EKG    IMPRESSION AND PLAN:   Left hip fracture UTI with leukocytosis Hyponatremia Dehydration Hypertension Diabetes  The patient will be admitted to orthopedic floor. She has moderate to high risk for left hip fracture. I will hold aspirin, no DVT prophylaxis for possible surgery, follow-up  orthopedic surgery consult. For UTI, I will continue Rocephin, follow-up urine culture and CBC. For dehydration and hyponatremia, I will start normal saline IV and follow-up BMP. For hypertension, she is not on any hypertension medication. Blood pressure is in low side. For diabetes, I will start sliding scale and continue Levemir.  All the records are reviewed and case discussed with ED provider. The patient is demented. I called the patient's caregiver, Lupita Leash. She told me to call her bowels Ms. Dollinger at [phone], but nobody answered the phone. The patient was full code during last admission. I will keep her full code for now.   CODE STATUS: Full code  TOTAL TIME TAKING CARE OF THIS PATIENT: 56 minutes.    Shaune Pollack M.D on 08/11/2015 at 2:57 PM  Between 7am to 6pm - Pager - [phone]  After 6pm go to [email or web address] - password [email or web address] [facility]  [facility]  Office  [phone]  CC: Primary care physician; Vonita Moss, MD

## 2015-08-11 NOTE — Care Management Important Message (Signed)
Important Message  Patient Details  Name: Josetta HuddleJosephine E Hettinger MRN: [id number] Date of Birth: [birth year]   Medicare Important Message Given:  Yes    Meera Vasco A, RN 08/11/2015, 5:27 PM

## 2015-08-11 NOTE — Care Management Note (Signed)
Case Management Note  Patient Details  Name: Josetta HuddleJosephine E Safi MRN: [id number] Date of Birth: 1929-05-04  Subjective/Objective:     81yo Mrs Glenis SmokerJosephine Okelley was admitted 08/11/15 with a left hip fracture and a UTI. Hx of Alzheimers dementia. PCP=Dr [employer] FinancialMark Crissman. [facility] ph: [phone]. Recently received a right hip repair and was at [facility] for Rehab. Legal Guardian is DSS. Anticipate discharge to Rehab. Social Work has been consulted.               Action/Plan:   Expected Discharge Date:                  Expected Discharge Plan:     In-House Referral:     Discharge planning Services     Post Acute Care Choice:    Choice offered to:     DME Arranged:    DME Agency:     HH Arranged:    HH Agency:     Status of Service:     Medicare Important Message Given:    Date Medicare IM Given:    Medicare IM give by:    Date Additional Medicare IM Given:    Additional Medicare Important Message give by:     If discussed at Long Length of Stay Meetings, dates discussed:    Additional Comments:  Keyler Hoge A, RN 08/11/2015, 5:21 PM

## 2015-08-11 NOTE — ED Notes (Signed)
Called Dana NumbersDonna Riddle [phone]) from [facility] and updated her on pts status.

## 2015-08-11 NOTE — Progress Notes (Signed)
Notified Dr Anne HahnWillis of blood pressure 88/42. 500cc NS bolus over 2 hours ordered

## 2015-08-11 NOTE — ED Notes (Signed)
[facility] B[facility]. Pt has been vomiting x2 days. Pt denies abdominal pain or diarrhea. Pt c/o left leg pain that she sts "has been going on for a long time."

## 2015-08-11 NOTE — ED Provider Notes (Signed)
[facility] Emergency Department Provider Note REMINDER - THIS NOTE IS NOT A FINAL MEDICAL RECORD UNTIL IT IS SIGNED. UNTIL THEN, THE CONTENT BELOW MAY REFLECT INFORMATION FROM A DOCUMENTATION TEMPLATE, NOT THE ACTUAL PATIENT VISIT. ____________________________________________  Time seen: Approximately 11:27 AM  I have reviewed the triage vital signs and the nursing notes.   HISTORY  Chief Complaint Emesis    HPI Dana Riddle is a 81 y.o. female previous history of diabetes, hyperlipidemia hypertension and a recent right hip open reduction internal fixation.  The patient reports that she has been throwing up, but she threw up twice after trying to eat breakfast this morning. At the present time she reports that she feels "just fine" and she is not having a pain or nausea. She does feel little bit dry, but denies any concerns. She reports the hip is not causing too much discomfort, it's a little bit achy but otherwise okay. She has not had any fevers or chills, no chest pain or trouble breathing.  She reports she vomited a couple of times yesterday as well, but was able to eat and hold on some food thereafter. She has a slight upset stomach, but the present time denies any symptoms.   Past Medical History  Diagnosis Date  . Diabetes mellitus without complication (HCC)   . Hyperlipidemia   . Hypertension   . Chronic kidney disease   . Alzheimer's dementia     Patient Active Problem List   Diagnosis Date Noted  . Closed left hip fracture (HCC) 08/11/2015  . UTI (lower urinary tract infection) 08/11/2015  . Hyponatremia 08/11/2015  . Pneumonia 07/12/2015  . Hip fracture (HCC) 07/09/2015  . Hypertension 03/12/2015  . Diabetes mellitus without complication (HCC) 03/12/2015  . Urinary tract infection 03/12/2015    Past Surgical History  Procedure Laterality Date  . Intramedullary (im) nail intertrochanteric Right 07/10/2015    Procedure:  INTRAMEDULLARY (IM) NAIL INTERTROCHANTRIC;  Surgeon: Donato Heinz, MD;  Location: [facility] ORS;  Service: Orthopedics;  Laterality: Right;    Current Outpatient Rx  Name  Route  Sig  Dispense  Refill  . acetaminophen (TYLENOL) 500 MG tablet   Oral   Take 500 mg by mouth every 6 (six) hours as needed for fever or headache.         Marland Kitchen aspirin EC 81 MG tablet   Oral   Take 81 mg by mouth daily.         . clonazePAM (KLONOPIN) 0.5 MG tablet   Oral   Take 0.5 tablets (0.25 mg total) by mouth daily as needed (for agitation). Patient taking differently: Take 0.25 mg by mouth 2 (two) times daily as needed for anxiety (for agitation).    15 tablet   0   . ferrous sulfate 325 (65 FE) MG tablet   Oral   Take 1 tablet (325 mg total) by mouth 2 (two) times daily with a meal.   30 tablet   0   . Insulin Detemir (LEVEMIR FLEXTOUCH) 100 UNIT/ML Pen   Subcutaneous   Inject 10 Units into the skin every morning.         . mirtazapine (REMERON) 15 MG tablet   Oral   Take 15 mg by mouth at bedtime.          Marland Kitchen oxyCODONE (OXY IR/ROXICODONE) 5 MG immediate release tablet   Oral   Take 1-2 tablets (5-10 mg total) by mouth every 4 (four) hours as needed for  breakthrough pain ((for MODERATE breakthrough pain)). Patient taking differently: Take 5 mg by mouth every 4 (four) hours as needed for moderate pain, severe pain or breakthrough pain ((for MODERATE breakthrough pain)).    30 tablet   0   . Vortioxetine HBr (TRINTELLIX) 10 MG TABS   Oral   Take 10 mg by mouth at bedtime.          . white petrolatum (VASELINE) GEL   Topical   Apply 1 application topically as needed for dry skin. Pt applies to legs.         . enoxaparin (LOVENOX) 40 MG/0.4ML injection   Subcutaneous   Inject 0.4 mLs (40 mg total) into the skin daily.   14 Syringe   0   . tamsulosin (FLOMAX) 0.4 MG CAPS capsule   Oral   Take 1 capsule (0.4 mg total) by mouth daily.   30 capsule   2   . traMADol (ULTRAM) 50  MG tablet   Oral   Take 1-2 tablets (50-100 mg total) by mouth every 4 (four) hours as needed for moderate pain.   30 tablet   0     Allergies Review of patient's allergies indicates no known allergies.  Family History  Problem Relation Age of Onset  . Cancer Father     lung    Social History Social History  Substance Use Topics  . Smoking status: Current Every Day Smoker -- 1.00 packs/day  . Smokeless tobacco: Never Used  . Alcohol Use: No    Review of Systems Constitutional: No fever/chills Eyes: No visual changes. ENT: No sore throat. Cardiovascular: Denies chest pain. Respiratory: Denies shortness of breath. Gastrointestinal: No abdominal pain.  See history of present illness.  No diarrhea.  No constipation. Genitourinary: Negative for dysuria. Musculoskeletal: Negative for back pain. He has some slight soreness around the right hip, but states this is slowly improving after surgery. Skin: Negative for rash. Neurological: Negative for headaches, focal weakness or numbness.  10-point ROS otherwise negative.  ____________________________________________   PHYSICAL EXAM:  VITAL SIGNS: ED Triage Vitals  Enc Vitals Group     BP 08/11/15 1027 103/58 mmHg     Pulse Rate 08/11/15 1027 94     Resp 08/11/15 1027 12     Temp 08/11/15 1027 97.2 F (36.2 C)     Temp Source 08/11/15 1027 Oral     SpO2 08/11/15 1027 98 %     Weight --      Height --      Head Cir --      [facility] Flow --      Pain Score --      Pain Loc --      Pain Edu? --      Excl. in GC? --    Constitutional: Alert and quite well oriented to self, place, and recent events. Well appearing and in no acute distress. She is a rather frail and somewhat underweight appearance. Eyes: Conjunctivae are normal. PERRL. EOMI. Head: Atraumatic. Nose: No congestion/rhinnorhea. Mouth/Throat: Mucous membranes are slightly dry.  Oropharynx non-erythematous. Neck: No stridor.   Cardiovascular: Normal rate,  regular rhythm. Grossly normal heart sounds.  Good peripheral circulation. Respiratory: Normal respiratory effort.  No retractions. Lungs CTAB. Gastrointestinal: Soft and nontender. No distention. No abdominal bruits.  Negative Murphy. No tenderness the right lower quadrant. Musculoskeletal: No lower extremity tenderness nor edema except for some very minimal tenderness over her right hip joint where she has a very clean  dry and intact well healing incision.  No joint effusions. Neurologic:  Normal speech and language. No gross focal neurologic deficits are appreciated. Skin:  Skin is warm, dry and intact. No rash noted. Psychiatric: Mood and affect are calm and appropriate in speech is clear.  ____________________________________________   LABS (all labs ordered are listed, but only abnormal results are displayed)  Labs Reviewed  CBC - Abnormal; Notable for the following:    WBC 13.7 (*)    RBC 3.52 (*)    Hemoglobin 10.7 (*)    HCT 32.3 (*)    All other components within normal limits  COMPREHENSIVE METABOLIC PANEL - Abnormal; Notable for the following:    Sodium 125 (*)    Chloride 91 (*)    Glucose, Bld 252 (*)    BUN 26 (*)    Calcium 8.5 (*)    Albumin 2.9 (*)    AST 11 (*)    ALT 9 (*)    Alkaline Phosphatase 188 (*)    GFR calc non Af Amer 59 (*)    All other components within normal limits  URINALYSIS COMPLETEWITH MICROSCOPIC ([facility] ONLY) - Abnormal; Notable for the following:    Color, Urine YELLOW (*)    APPearance HAZY (*)    Glucose, UA 50 (*)    Hgb urine dipstick 1+ (*)    Protein, ur 30 (*)    Leukocytes, UA 3+ (*)    Squamous Epithelial / LPF 0-5 (*)    All other components within normal limits  URINE CULTURE  LIPASE, BLOOD  TROPONIN I  CBG MONITORING, ED   ____________________________________________  EKG  Reviewed and interpreted by me EKG time 11:55 AM Normal sinus rhythm Heart rate 85 QTC 40 Normal sinus rhythm, no significant ischemic T-wave  abnormality is seen, there is a minimal T-wave inversion in aVL Occasional PVC ____________________________________________  RADIOLOGY   DG HIP UNILAT WITH PELVIS 2-3 VIEWS LEFT (Final result) Result time: 08/11/15 14:18:12   Final result by Rad Results In Interface (08/11/15 14:18:12)   Narrative:   CLINICAL DATA: Left hip pain post fall today.  EXAM: DG HIP (WITH OR WITHOUT PELVIS) 2-3V LEFT  COMPARISON: 07/09/2015 and 07/10/2015  FINDINGS: There are mild symmetric degenerative changes of the hips. Fixation hardware over the right proximal femur is intact fixating recent intertrochanteric fracture with anatomic alignment over the fracture site. There is a new displaced left trochanteric fracture with mild comminution. Exaggerated coxa vera deformity and mild impaction about the fracture site. Remainder the exam is unchanged.  IMPRESSION: New displaced slightly comminuted left intertrochanteric fracture.  Fixation hardware intact bridging recent right intertrochanteric fracture with anatomic alignment about the fracture site.   Electronically Signed By: Elberta Fortis M.D. On: 08/11/2015 14:18          DG Abd 1 View (Final result) Result time: 08/11/15 11:58:45   Final result by Rad Results In Interface (08/11/15 11:58:45)   Narrative:   CLINICAL DATA: Nursing outpatient, intermittent vomiting for 2 days. Diabetes.  EXAM: ABDOMEN - 1 VIEW  COMPARISON: 07/11/2015  FINDINGS: Scattered air and stool throughout the bowel without significant dilatation, ileus pattern or obstruction. Prior right hip fixation for a remote intertrochanteric fracture.  Included imaging of the left hip demonstrates a proximal left femoral neck fracture with displacement. This is new since 07/09/2015. Consider dedicated left hip radiographs.  Degenerative changes of the spine with a mild levoscoliosis.  IMPRESSION: Nonobstructive bowel gas pattern.  Previous right  hip fixation hardware  Acute to subacute proximal left hip femoral neck fracture. Recommend dedicated left hip series. This is new since 07/09/2015.     ____________________________________________   PROCEDURES  Procedure(s) performed: None  Critical Care performed: No  ____________________________________________   INITIAL IMPRESSION / ASSESSMENT AND PLAN / ED COURSE  Pertinent labs & imaging results that were available during my care of the patient were reviewed by me and considered in my medical decision making (see chart for details).  This presents for evaluation of vomiting a few times her last couple days. She denies that she is having any abdominal pain, and last vomited twice after trying to eat breakfast. Her exam is very reassuring, she is in no distress though she does appear slightly dehydrated by clinical exam. She has no abdominal pain whatsoever on detailed abdominal exam. She denies any cardio pulmonary symptoms and it appears that her hip incision is healing well. Differential diagnosis would certainly include gastritis, self-limited viral illness, undefined less likely bowel obstruction given she is not any pain she currently reports no active symptoms. Other thoughts would include urinary tract infection and asked her recent surgery, no evidence of common location about the hip joint. She is afebrile here. Although there could be multiple causes for her nausea including slight abnormalities, obtain x-ray to screen for evidence of bowel obstruction, and given that she has no pain or symptoms at this time if labs and x-ray appear normal and she is able to take by mouth I anticipate likely discharge back to her assisted living center.  ----------------------------------------- 1:27 PM on 08/11/2015 -----------------------------------------  Patient remains awake alert no distress. Her x-rays interesting in that it noted a possible left hip fracture. The patient has no  recall of his injury but apparently false Wrigley. On axial loading she does localize pain to the left hip and has tenderness over the left lateral hip joint. I am concerned about the possibility of a left hip fracture. Pleuritic dedicated films. Her sodium is 125 with associated nausea vomiting and generalized weakness, I will admit the patient to hospital after reviewing x-rays. She also has an associated urinary tract infection which I start ceftriaxone. ____________________________________________   FINAL CLINICAL IMPRESSION(S) / ED DIAGNOSES  Final diagnoses:  Closed left hip fracture, initial encounter (HCC)  Acute lower urinary tract infection  Hyponatremia  Acute vomiting      Sharyn CreamerMark Natacha Jepsen, MD 08/11/15 1450

## 2015-08-11 NOTE — Progress Notes (Addendum)
Pt admitted to room with left hip fx. Confused. No family at bedside.Perirectal area red. Dr Imogene Burnchen  Notified. Nystatin cream applied. Pt scheduled for surgery tomorrow

## 2015-08-11 NOTE — Progress Notes (Signed)
Unable to complete admission assessment due to patients cognitive status. No family present at this time.

## 2015-08-12 LAB — HEMOGLOBIN AND HEMATOCRIT, BLOOD
HCT: 31 % — ABNORMAL LOW (ref 35.0–47.0)
HEMATOCRIT: 27.4 % — AB (ref 35.0–47.0)
HEMOGLOBIN: 9.1 g/dL — AB (ref 12.0–16.0)
Hemoglobin: 10.5 g/dL — ABNORMAL LOW (ref 12.0–16.0)

## 2015-08-12 LAB — BASIC METABOLIC PANEL
Anion gap: 6 (ref 5–15)
BUN: 20 mg/dL (ref 6–20)
CHLORIDE: 97 mmol/L — AB (ref 101–111)
CO2: 27 mmol/L (ref 22–32)
CREATININE: 0.79 mg/dL (ref 0.44–1.00)
Calcium: 7.9 mg/dL — ABNORMAL LOW (ref 8.9–10.3)
GFR calc Af Amer: >60 mL/min (ref >60)
GFR calc non Af Amer: >60 mL/min (ref >60)
GLUCOSE: 219 mg/dL — AB (ref 65–99)
POTASSIUM: 4.3 mmol/L (ref 3.5–5.1)
SODIUM: 130 mmol/L — AB (ref 135–145)

## 2015-08-12 LAB — CBC
HEMATOCRIT: 26.1 % — AB (ref 35.0–47.0)
Hemoglobin: 8.8 g/dL — ABNORMAL LOW (ref 12.0–16.0)
MCH: 31.6 pg (ref 26.0–34.0)
MCHC: 33.9 g/dL (ref 32.0–36.0)
MCV: 93.1 fL (ref 80.0–100.0)
PLATELETS: 273 10*3/uL (ref 150–440)
RBC: 2.8 MIL/uL — ABNORMAL LOW (ref 3.80–5.20)
RDW: 14.2 % (ref 11.5–14.5)
WBC: 9.5 10*3/uL (ref 3.6–11.0)

## 2015-08-12 LAB — GLUCOSE, CAPILLARY
GLUCOSE-CAPILLARY: 94 mg/dL (ref 65–99)
Glucose-Capillary: 210 mg/dL — ABNORMAL HIGH (ref 65–99)
Glucose-Capillary: 108 mg/dL — ABNORMAL HIGH (ref 65–99)
Glucose-Capillary: 115 mg/dL — ABNORMAL HIGH (ref 65–99)

## 2015-08-12 LAB — PREPARE RBC (CROSSMATCH)

## 2015-08-12 MED ORDER — CEFAZOLIN IN D5W 1 GM/50ML IV SOLN
1.0000 g | INTRAVENOUS | Status: DC
  Filled 2015-08-12 (×2): qty 50

## 2015-08-12 MED ORDER — SODIUM CHLORIDE 0.9 % IV SOLN
Freq: Once | INTRAVENOUS | Status: AC
  Administered 2015-08-12: 14:00:00 via INTRAVENOUS

## 2015-08-12 NOTE — Progress Notes (Signed)
   08/12/15 1941  Clinical Encounter Type  Visited With Patient  Visit Type Initial  Referral From Nurse  Consult/Referral To Chaplain  Chaplain visited with patient and assisted her with getting warm by covering her in a third blanket. I offered pastoral care in which the patient didn't seem to desire as she stated she was tired and wanted to go to sleep. Will attempt to visit tomorrow if time permits.   Chaplain Martha Ellerby [phone]

## 2015-08-12 NOTE — Clinical Social Work Placement (Signed)
   CLINICAL SOCIAL WORK PLACEMENT  NOTE  Date:  08/12/2015  Patient Details  Name: Dana Riddle MRN: [id number] Date of Birth: 1928-12-21  Clinical Social Work is seeking post-discharge placement for this patient at the Skilled  Nursing Facility level of care (*CSW will initial, date and re-position this form in  chart as items are completed):  Yes   Patient/family provided with [place] Clinical Social Work Department's list of facilities offering this level of care within the geographic area requested by the patient (or if unable, by the patient's family).  Yes   Patient/family informed of their freedom to choose among providers that offer the needed level of care, that participate in Medicare, Medicaid or managed care program needed by the patient, have an available bed and are willing to accept the patient.  Yes   Patient/family informed of [place]'s ownership interest in [facility] and [facility], as well as of the fact that they are under no obligation to receive care at these facilities.  PASRR submitted to EDS on       PASRR number received on       Existing PASRR number confirmed on 08/12/15     FL2 transmitted to all facilities in geographic area requested by pt/family on 08/12/15     FL2 transmitted to all facilities within larger geographic area on       Patient informed that his/her managed care company has contracts with or will negotiate with certain facilities, including the following:            Patient/family informed of bed offers received.  Patient chooses bed at       Physician recommends and patient chooses bed at      Patient to be transferred to   on  .  Patient to be transferred to facility by       Patient family notified on   of transfer.  Name of family member notified:        PHYSICIAN Please sign FL2     Additional Comment:    _______________________________________________ Haig ProphetMorgan, Saidah Kempton G, LCSW 08/12/2015, 4:40  PM

## 2015-08-12 NOTE — Progress Notes (Addendum)
  Subjective:    left Intertrochanteric hip fracture. Patient reports pain as mild.   Patient is well, and has had no acute complaints or problems Plan is to go Skilled nursing facility after hospital stay. Negative for chest pain and shortness of breath Fever: no Gastrointestinal:negative for nausea and vomiting  Objective: Vital signs in last 24 hours: Temp:  [97.2 F (36.2 C)-98.8 F (37.1 C)] 98.8 F (37.1 C) (11/14 0734) Pulse Rate:  [75-94] 75 (11/14 0734) Resp:  [10-18] 18 (11/14 0734) BP: (88-123)/(42-63) 106/50 mmHg (11/14 0734) SpO2:  [95 %-100 %] 96 % (11/14 0734) Weight:  [42.185 kg (93 lb)] 42.185 kg (93 lb) (11/13 1620)  Intake/Output from previous day:  Intake/Output Summary (Last 24 hours) at 08/12/15 0742 Last data filed at 08/12/15 0724  Gross per 24 hour  Intake 1213.75 ml  Output      0 ml  Net 1213.75 ml    Intake/Output this shift: Total I/O In: 1093.8 [I.V.:1093.8] Out: 0   Labs:  Recent Labs  08/11/15 1134 08/12/15 0356  HGB 10.7* 8.8*    Recent Labs  08/11/15 1134 08/12/15 0356  WBC 13.7* 9.5  RBC 3.52* 2.80*  HCT 32.3* 26.1*  PLT 333 273    Recent Labs  08/11/15 1134 08/12/15 0356  NA 125* 130*  K 4.6 4.3  CL 91* 97*  CO2 26 27  BUN 26* 20  CREATININE 0.88 0.79  GLUCOSE 252* 219*  CALCIUM 8.5* 7.9*   No results for input(s): LABPT, INR in the last 72 hours.   EXAM General - Patient is Disorganized, Confused and Lacking, patient is pleasantly demented. Extremity - Neurologically intact ABD soft Neurovascular intact Sensation intact distally Intact pulses distally Dorsiflexion/Plantar flexion intact No cellulitis present Compartment soft Motor Function - intact, moving foot and toes well on exam.  Past Medical History  Diagnosis Date  . Diabetes mellitus without complication (HCC)   . Hyperlipidemia   . Hypertension   . Chronic kidney disease   . Alzheimer's dementia     Assessment/Plan:     Principal  Problem:   Closed left hip fracture (HCC) Active Problems:   UTI (lower urinary tract infection)   Hyponatremia  Estimated body mass index is 16.48 kg/(m^2) as calculated from the following:   Height as of this encounter: 5\' 3"  (1.6 m).   Weight as of this encounter: 42.185 kg (93 lb).  Pt is pleasantly demented.   Plan is for ORIF when medically stable, Hg 8.8 this AM.  Internal Med to transfuse today for surgery tomorrow. Dr. Rosita KeaMenz plans to take patient to OR tomorrow.  Allow patient to eat today, NPO after midnight tonight and consent obtained today. Continue to monitor, BP 106/50 and Na 130, continue IVF.  DVT Prophylaxis - Foot Pumps and TED hose  J. Horris LatinoLance McGhee, PA-C [facility] 08/12/2015, 7:42 AM

## 2015-08-12 NOTE — Progress Notes (Signed)
Lazaro ArmsKay Flack called back from DSS, she states she notified Lady GaryAdrian Daye Engineer, maintenance (IT)(deputy director) to call back cor consents. Now waiting on call back from [facility].

## 2015-08-12 NOTE — Clinical Documentation Improvement (Signed)
Hospitalist  Can the diagnosis of CKD be further specified?   CKD Stage I - GFR greater than or equal to 90  CKD Stage II - GFR 60-89  CKD Stage III - GFR 30-59  CKD Stage IV - GFR 15-29  CKD Stage V - GFR < 15  ESRD (End Stage Renal Disease)  Other condition  Unable to clinically determine   Supporting Information: : (risk factors, signs and symptoms, diagnostics, treatment) Patient with a history of CKD per 11/13 progress notes.  Labs:    Bun     Creat     GFR: 11/13:   26         0.88       59 11/14:   20         0.79      >60  Please exercise your independent, professional judgment when responding. A specific answer is not anticipated or expected.   Thank Sabino DonovanYou, Dorma Altman [facility] [employer] [place] [phone]

## 2015-08-12 NOTE — Progress Notes (Signed)
[facility] [place] [facility]   PATIENT NAME: Dana SmokerJosephine Riddle    MR#:  [id number]  DATE OF BIRTH:  01/09/1929  SUBJECTIVE:  CHIEF COMPLAINT:   Chief Complaint  Patient presents with  . Emesis   patient is a 81 year old female with past history significant for history of present. Right hip fracture, hypertension, diabetes. He daily. Alzheimer's dementia who presents to the hospital due to emesis. On arrival to the hospital, she complained of hip pain and was noted to have left hip fracture. Her labs revealed anemia, hyponatremia, pyuria, concerning for urinary tract infection. Patient is not able to provide review of systems due to dementia. Blood pressure remains low in 80s to 90s  Review of Systems  Unable to perform ROS: dementia    VITAL SIGNS: Blood pressure 110/54, pulse 80, temperature 99.2 F (37.3 C), temperature source Oral, resp. rate 18, height 5\' 3"  (1.6 m), weight 42.185 kg (93 lb), SpO2 98 %.  PHYSICAL EXAMINATION:   GENERAL:  81 y.o.-year-old patient lying in the bed with no acute distress.  EYES: Pupils equal, round, reactive to light and accommodation. No scleral icterus. Extraocular muscles intact.  HEENT: Head atraumatic, normocephalic. Oropharynx and nasopharynx clear.  NECK:  Supple, no jugular venous distention. No thyroid enlargement, no tenderness.  LUNGS: Normal breath sounds bilaterally, no wheezing, rales,rhonchi or crepitation. No use of accessory muscles of respiration.  CARDIOVASCULAR: S1, S2 normal. No murmurs, rubs, or gallops.  ABDOMEN: Soft, nontender, nondistended. Bowel sounds present. No organomegaly or mass.  EXTREMITIES: No pedal edema, cyanosis, or clubbing.  NEUROLOGIC: Cranial nerves II through XII are intact. Muscle strength 5/5 in all extremities. Sensation intact. Gait not checked.  PSYCHIATRIC: The patient is alert but not oriented , intermittently follows commands. Minimal verbal interaction.  SKIN: No  obvious rash, lesion, or ulcer.   ORDERS/RESULTS REVIEWED:   CBC  Recent Labs Lab 08/11/15 1134 08/12/15 0356 08/12/15 1140  WBC 13.7* 9.5  --   HGB 10.7* 8.8* 9.1*  HCT 32.3* 26.1* 27.4*  PLT 333 273  --   MCV 91.7 93.1  --   MCH 30.3 31.6  --   MCHC 33.0 33.9  --   RDW 14.5 14.2  --    ------------------------------------------------------------------------------------------------------------------  Chemistries   Recent Labs Lab 08/11/15 1134 08/11/15 1617 08/12/15 0356  NA 125*  --  130*  K 4.6  --  4.3  CL 91*  --  97*  CO2 26  --  27  GLUCOSE 252*  --  219*  BUN 26*  --  20  CREATININE 0.88  --  0.79  CALCIUM 8.5*  --  7.9*  MG  --  1.7  --   AST 11*  --   --   ALT 9*  --   --   ALKPHOS 188*  --   --   BILITOT 1.2  --   --    ------------------------------------------------------------------------------------------------------------------ estimated creatinine clearance is 34.9 mL/min (by C-G formula based on Cr of 0.79). ------------------------------------------------------------------------------------------------------------------ No results for input(s): TSH, T4TOTAL, T3FREE, THYROIDAB in the last 72 hours.  Invalid input(s): FREET3  Cardiac Enzymes  Recent Labs Lab 08/11/15 1134  TROPONINI <0.03   ------------------------------------------------------------------------------------------------------------------ Invalid input(s): POCBNP ---------------------------------------------------------------------------------------------------------------  RADIOLOGY: Dg Chest 1 View  08/11/2015  CLINICAL DATA:  Vomiting today with left hip pain.  Known fracture. EXAM: CHEST 1 VIEW COMPARISON:  07/11/2015 and 07/09/2015 as well as 05/31/2013 FINDINGS: Lungs are adequately inflated with resolution of the  previous noted bibasilar airspace process. No evidence of effusion. Cardiomediastinal silhouette is within normal. There is calcified plaque over the  thoracic aorta. Remainder of the exam is unchanged. IMPRESSION: No acute cardiopulmonary disease. Interval resolution of bibasilar airspace process. Electronically Signed   By: Elberta Fortis M.D.   On: 08/11/2015 17:45   Dg Abd 1 View  08/11/2015  CLINICAL DATA:  Nursing outpatient, intermittent vomiting for 2 days. Diabetes. EXAM: ABDOMEN - 1 VIEW COMPARISON:  07/11/2015 FINDINGS: Scattered air and stool throughout the bowel without significant dilatation, ileus pattern or obstruction. Prior right hip fixation for a remote intertrochanteric fracture. Included imaging of the left hip demonstrates a proximal left femoral neck fracture with displacement. This is new since 07/09/2015. Consider dedicated left hip radiographs. Degenerative changes of the spine with a mild levoscoliosis. IMPRESSION: Nonobstructive bowel gas pattern. Previous right hip fixation hardware Acute to subacute proximal left hip femoral neck fracture. Recommend dedicated left hip series. This is new since 07/09/2015. Electronically Signed   By: Judie Petit.  Shick M.D.   On: 08/11/2015 11:58   Dg Hip Unilat With Pelvis 2-3 Views Left  08/11/2015  CLINICAL DATA:  Left hip pain post fall today. EXAM: DG HIP (WITH OR WITHOUT PELVIS) 2-3V LEFT COMPARISON:  07/09/2015 and 07/10/2015 FINDINGS: There are mild symmetric degenerative changes of the hips. Fixation hardware over the right proximal femur is intact fixating recent intertrochanteric fracture with anatomic alignment over the fracture site. There is a new displaced left trochanteric fracture with mild comminution. Exaggerated coxa vera deformity and mild impaction about the fracture site. Remainder the exam is unchanged. IMPRESSION: New displaced slightly comminuted left intertrochanteric fracture. Fixation hardware intact bridging recent right intertrochanteric fracture with anatomic alignment about the fracture site. Electronically Signed   By: Elberta Fortis M.D.   On: 08/11/2015 14:18     EKG:  Orders placed or performed during the hospital encounter of 08/11/15  . ED EKG  . ED EKG    ASSESSMENT AND PLAN:  Principal Problem:   Closed left hip fracture (HCC) Active Problems:   UTI (lower urinary tract infection)   Hyponatremia  1. closed left intratrochanteric hip fracture, initial encounter, orthopedic surgery is planning surgical intervention, possibly tomorrow, supportive therapy, pain control 2. Hypotension, unclear etiology, possibly due to anemia versus urinary tract infection/sepsis related, continue IV fluids as well as blood transfusion, follow blood pressure readings closely 3. Hyponatremia. Follow with IV fluid administration, likely dehydration related 4. Acute cystitis without hematuria, urine cultures are taken. Continue patient on Rocephin, follow culture results and adjust antibiotics depending on culture results 5. Anemia, likely anemia of chronic disease. Transfuse 1 unit of packed red blood cells preoperatively, follow tomorrow in the morning   Management plans discussed with the patient, family and they are in agreement.   DRUG ALLERGIES: No Known Allergies  CODE STATUS:     Code Status Orders        Start     Ordered   08/11/15 1605  Full code   Continuous     08/11/15 1604      TOTAL TIME TAKING CARE OF THIS PATIENT: 40 minutes.    Katharina Caper M.D on 08/12/2015 at 3:00 PM  Between 7am to 6pm - Pager - [phone]  After 6pm go to [email or web address] - password [email or web address] [facility]  [place] [facility]  Office  [phone]  CC: Primary care physician; Vonita Moss, MD

## 2015-08-12 NOTE — Progress Notes (Signed)
Spoke with Lazaro ArmsKay Flack from DSS in regards to getting consents completed for patient, states she will notify the deputy director for them to call back to obtain consents.

## 2015-08-12 NOTE — Clinical Social Work Note (Signed)
Clinical Social Work Assessment  Patient Details  Name: Dana HuddleJosephine E Balch MRN: [id number] Date of Birth: 1929/03/03  Date of referral:  08/12/15               Reason for consult:  Facility Placement, Other (Comment Required) ([facility] )                Permission sought to share information with:  Oceanographeracility Contact Representative Permission granted to share information::  Yes, Verbal Permission Granted  Name::      Skilled Nursing Facility   Agency::   [place]   Relationship::     Contact Information:     Housing/Transportation Living arrangements for the past 2 months:  Assisted Press photographerLiving Facility, Skilled Nursing Facility Source of Information:  Guardian Patient Interpreter Needed:  None Criminal Activity/Legal Involvement Pertinent to Current Situation/Hospitalization:  No - Comment as needed Significant Relationships:  Other Family Members (Niece Dana Riddle ) Lives with:  Facility Resident Do you feel safe going back to the place where you live?  Yes Need for family participation in patient care:  Yes (Comment)  Care giving concerns:  [facility].    Social Worker assessment / plan: Visual merchandiserClinical Social Worker (CSW) received SNF consult. CSW contacted patient's DSS Guardian Lazaro ArmsKay Flack [phone]. Per Joyce GrossKay patient was in [facility] for a hip fracture in Oct. 2016. Patient has fallen again and sustained a closed left hip fracture. Patient is scheduled for surgery tomorrow. Per Joyce GrossKay she would like for patient to return to [facility] for short term rehab. Per Joyce GrossKay patient's niece Dana Riddle is involved in patient's care and will likely visit patient.   FL2 complete and faxed out. CSW will continue to follow and assist as needed.      Employment status:  Retired Health and safety inspectornsurance information:  Armed forces operational officerMedicare, Medicaid In [place] PT Recommendations:  Not assessed at this time Information / Referral to community resources:  Skilled Nursing  Facility  Patient/Family's Response to care:  DSS guardian Joyce GrossKay is agreeable to SNF search and prefers [facility].   Patient/Family's Understanding of and Emotional Response to Diagnosis, Current Treatment, and Prognosis: Guardian was pleasant.   Emotional Assessment Appearance:  Appears stated age Attitude/Demeanor/Rapport:  Unable to Assess Affect (typically observed):  Unable to Assess Orientation:  Oriented to Self, Fluctuating Orientation (Suspected and/or reported Sundowners) Alcohol / Substance use:  Not Applicable Psych involvement (Current and /or in the community):  No (Comment)  Discharge Needs  Concerns to be addressed:  Discharge Planning Concerns Readmission within the last 30 days:  No Current discharge risk:  Chronically ill, Dependent with Mobility Barriers to Discharge:  Continued Medical Work up   Mirian MoMorgan, Ramirez Fullbright G, LCSW 08/12/2015, 4:42 PM

## 2015-08-12 NOTE — Progress Notes (Signed)
I called Dana ArmsKay Riddle back to check on to see when Dana Riddle is going to get in contact with me. She states she will notify Dana Riddle again to call me.

## 2015-08-12 NOTE — Progress Notes (Signed)
Inpatient Diabetes Program Recommendations  AACE/ADA: New Consensus Statement on Inpatient Glycemic Control (2015)  Target Ranges:  Prepandial:   less than 140 mg/dL      [facility] postprandial:   less than 180 mg/dL (1-2 hours)      Critically ill patients:  140 - 180 mg/dL  Results for Dana Riddle, Dana Riddle (MRN [id number]) as of 08/12/2015 11:33  Ref. Range 08/11/2015 16:27 08/11/2015 20:37 08/12/2015 07:25 08/12/2015 11:23  Glucose-Capillary Latest Ref Range: 65-99 mg/dL 086189 (H) 578228 (H) 469210 (H) 94   Review of Glycemic Control  Diabetes history: DM2 Outpatient Diabetes medications: Levemir 10 units QAM Current orders for Inpatient glycemic control: Levemir 10 units QAM, Novolog 0-9 units TID with meals, Novolog 0-5 units HS  Inpatient Diabetes Program Recommendations: Insulin - Basal: Patient is ordered Levemir 10 units QAM (as taken as an outpatient). Patient was given Levemir 10 units this morning at 6:51 am and glucose now 94 mg/dl at 62:9511:23 am. Patient will be NPO after midnight for surgery on 08/13/15. Recommend discontinuing Lantus for now and use Novolog correction insulin Q4H at this time. Correction (SSI): Note patient will be NPO after midnight tonight. Please consider changing CBGs and Novolog 0-9 units to Q4H.  Thanks, Orlando PennerMarie Ahyan Kreeger, RN, MSN, CDE Diabetes Coordinator Inpatient Diabetes Program [phone] (Team Pager from 8am to 5pm) [phone] (AP office) [phone] [facility] office) [phone] [facility] office)

## 2015-08-12 NOTE — Progress Notes (Signed)
Dana Riddle called back to give telephone consent for surgery and blood, 2nd nurse verified consent as well.

## 2015-08-12 NOTE — NC FL2 (Signed)
[place] MEDICAID FL2 LEVEL OF CARE SCREENING TOOL     IDENTIFICATION  Patient Name: Dana Riddle Birthdate: Dec 07, 1928 Sex: female Admission Date (Current Location): 08/11/2015  [place] and [place] Number:  [facility] [place] )  ([id number]) Facility and Address:  [facility], [address] [place], [place] [address]      Provider Number: [id number] [phone])  Attending Physician Name and Address:  Katharina Caper, MD  Relative Name and Phone Number:       Current Level of Care: Hospital Recommended Level of Care: Skilled Nursing Facility Prior Approval Number:    Date Approved/Denied:   PASRR Number:  ( [id number] )  Discharge Plan: SNF    Current Diagnoses: Patient Active Problem List   Diagnosis Date Noted  . Closed left hip fracture (HCC) 08/11/2015  . UTI (lower urinary tract infection) 08/11/2015  . Hyponatremia 08/11/2015  . Pneumonia 07/12/2015  . Hip fracture (HCC) 07/09/2015  . Hypertension 03/12/2015  . Diabetes mellitus without complication (HCC) 03/12/2015  . Urinary tract infection 03/12/2015    Orientation ACTIVITIES/SOCIAL BLADDER RESPIRATION    Self, Time, Situation, Place  Active Incontinent, Indwelling catheter Normal  BEHAVIORAL SYMPTOMS/MOOD NEUROLOGICAL BOWEL NUTRITION STATUS   (none )  (none ) Continent Diet (Diet: Carb Modified )  PHYSICIAN VISITS COMMUNICATION OF NEEDS Height & Weight Skin  30 days Verbally  (160 cm) 93 lbs. Surgical wounds (Incision: Left Hip. )          AMBULATORY STATUS RESPIRATION    Assist extensive Normal      Personal Care Assistance Level of Assistance  Bathing, Feeding, Dressing Bathing Assistance: Limited assistance Feeding assistance: Independent Dressing Assistance: Limited assistance      Functional Limitations Info  Sight, Hearing, Speech Sight Info: Adequate Hearing Info: Impaired Speech Info: Adequate       SPECIAL CARE FACTORS FREQUENCY  PT (By  licensed PT), OT (By licensed OT)     PT Frequency:  (5) OT Frequency:  (5)           Additional Factors Info  Code Status, Insulin Sliding Scale Code Status Info:  (Full Code. )     Insulin Sliding Scale Info:  (Novolog Insulin Injections. )       Current Medications (08/12/2015): Current Facility-Administered Medications  Medication Dose Route Frequency Provider Last Rate Last Dose  . 0.9 %  sodium chloride infusion   Intravenous Continuous Shaune Pollack, MD 75 mL/hr at 08/12/15 0743    . 0.9 %  sodium chloride infusion   Intravenous Once Katharina Caper, MD      . acetaminophen (TYLENOL) tablet 650 mg  650 mg Oral Q6H PRN Shaune Pollack, MD       Or  . acetaminophen (TYLENOL) suppository 650 mg  650 mg Rectal Q6H PRN Shaune Pollack, MD      . albuterol (PROVENTIL) (2.5 MG/3ML) 0.083% nebulizer solution 2.5 mg  2.5 mg Nebulization Q2H PRN Shaune Pollack, MD      . Melene Muller ON 08/13/2015] Cefazolin in D5W SOLN 1 g  1 g Other To OR Kennedy Bucker, MD      . cefTRIAXone (ROCEPHIN) 1 g in dextrose 5 % 50 mL IVPB  1 g Intravenous Q24H Shaune Pollack, MD   1 g at 08/12/15 1313  . clonazePAM (KLONOPIN) tablet 0.25 mg  0.25 mg Oral Daily PRN Shaune Pollack, MD      . ferrous sulfate tablet 325 mg  325 mg Oral BID WC Shaune Pollack, MD  325 mg at 08/11/15 1648  . insulin aspart (novoLOG) injection 0-5 Units  0-5 Units Subcutaneous QHS Shaune Pollack, MD   2 Units at 08/11/15 2129  . insulin aspart (novoLOG) injection 0-9 Units  0-9 Units Subcutaneous TID WC Shaune Pollack, MD   3 Units at 08/12/15 [phone]  . insulin detemir (LEVEMIR) injection 10 Units  10 Units Subcutaneous [address], [place]   10 Units at 08/12/15 9604  . mirtazapine (REMERON) tablet 15 mg  15 mg Oral QHS Shaune Pollack, MD   15 mg at 08/11/15 2128  . morphine 4 MG/ML injection 4 mg  4 mg Intravenous Q4H PRN Shaune Pollack, MD      . nystatin cream (MYCOSTATIN)   Topical BID Shaune Pollack, MD      . ondansetron [facility]) tablet 4 mg  4 mg Oral Q6H PRN Shaune Pollack, MD       Or   . ondansetron [facility]) injection 4 mg  4 mg Intravenous Q6H PRN Shaune Pollack, MD      . oxyCODONE (Oxy IR/ROXICODONE) immediate release tablet 5 mg  5 mg Oral Q4H PRN Shaune Pollack, MD   5 mg at 08/11/15 2132  . tamsulosin (FLOMAX) capsule 0.4 mg  0.4 mg Oral Daily Shaune Pollack, MD   0.4 mg at 08/12/15 0743  . Vortioxetine HBr TABS 10 mg  10 mg Oral QHS Shaune Pollack, MD   10 mg at 08/11/15 2128   Do not use this list as official medication orders. Please verify with discharge summary.  Discharge Medications:   Medication List    ASK your doctor about these medications        acetaminophen 500 MG tablet  Commonly known as:  TYLENOL  Take 500 mg by mouth every 6 (six) hours as needed for fever or headache.     aspirin EC 81 MG tablet  Take 81 mg by mouth daily.     clonazePAM 0.5 MG tablet  Commonly known as:  KLONOPIN  Take 0.5 tablets (0.25 mg total) by mouth daily as needed (for agitation).     enoxaparin 40 MG/0.4ML injection  Commonly known as:  LOVENOX  Inject 0.4 mLs (40 mg total) into the skin daily.     ferrous sulfate 325 (65 FE) MG tablet  Take 1 tablet (325 mg total) by mouth 2 (two) times daily with a meal.     LEVEMIR FLEXTOUCH 100 UNIT/ML Pen  Generic drug:  Insulin Detemir  Inject 10 Units into the skin every morning.     mirtazapine 15 MG tablet  Commonly known as:  REMERON  Take 15 mg by mouth at bedtime.     oxyCODONE 5 MG immediate release tablet  Commonly known as:  Oxy IR/ROXICODONE  Take 1-2 tablets (5-10 mg total) by mouth every 4 (four) hours as needed for breakthrough pain ((for MODERATE breakthrough pain)).     tamsulosin 0.4 MG Caps capsule  Commonly known as:  FLOMAX  Take 1 capsule (0.4 mg total) by mouth daily.     traMADol 50 MG tablet  Commonly known as:  ULTRAM  Take 1-2 tablets (50-100 mg total) by mouth every 4 (four) hours as needed for moderate pain.     TRINTELLIX 10 MG Tabs  Generic drug:  Vortioxetine HBr  Take 10 mg by mouth at  bedtime.     white petrolatum Gel  Commonly known as:  VASELINE  Apply 1 application topically as needed for dry skin. Pt applies  to legs.        Relevant Imaging Results:  Relevant Lab Results:  Recent Labs    Additional Information  SSN: [id number] DSS Guardian  Haig ProphetMorgan, Shine Mikes G, LCSW

## 2015-08-13 ENCOUNTER — Encounter: Payer: Self-pay | Admitting: *Deleted

## 2015-08-13 ENCOUNTER — Inpatient Hospital Stay: Payer: Medicare Other

## 2015-08-13 ENCOUNTER — Inpatient Hospital Stay: Payer: Medicare Other | Admitting: Anesthesiology

## 2015-08-13 ENCOUNTER — Encounter
Admission: EM | Disposition: A | Discharge status: Skilled Nursing Facility | Payer: Self-pay | Source: Home / Self Care | Attending: Internal Medicine

## 2015-08-13 HISTORY — PX: FEMUR IM NAIL: SHX1597

## 2015-08-13 LAB — BASIC METABOLIC PANEL
ANION GAP: 6 (ref 5–15)
BUN: 13 mg/dL (ref 6–20)
CALCIUM: 8 mg/dL — AB (ref 8.9–10.3)
CHLORIDE: 100 mmol/L — AB (ref 101–111)
CO2: 24 mmol/L (ref 22–32)
Creatinine, Ser: 0.61 mg/dL (ref 0.44–1.00)
GFR calc non Af Amer: >60 mL/min (ref >60)
GLUCOSE: 124 mg/dL — AB (ref 65–99)
POTASSIUM: 3.9 mmol/L (ref 3.5–5.1)
SODIUM: 130 mmol/L — AB (ref 135–145)

## 2015-08-13 LAB — TYPE AND SCREEN
ABO/RH(D): A POS
ANTIBODY SCREEN: NEGATIVE
Unit division: 0

## 2015-08-13 LAB — GLUCOSE, CAPILLARY
GLUCOSE-CAPILLARY: 101 mg/dL — AB (ref 65–99)
GLUCOSE-CAPILLARY: 144 mg/dL — AB (ref 65–99)
Glucose-Capillary: 130 mg/dL — ABNORMAL HIGH (ref 65–99)

## 2015-08-13 SURGERY — INSERTION, INTRAMEDULLARY ROD, FEMUR
Anesthesia: Spinal | Site: Hip | Laterality: Left | Wound class: Clean

## 2015-08-13 MED ORDER — ONDANSETRON HCL 4 MG/2ML IJ SOLN: 4.0000 mg | Freq: Once | INTRAMUSCULAR | Status: DC | PRN

## 2015-08-13 MED ORDER — MAGNESIUM CITRATE PO SOLN
1.0000 | Freq: Once | ORAL | Status: DC | PRN
  Filled 2015-08-13: qty 296

## 2015-08-13 MED ORDER — FENTANYL CITRATE (PF) 100 MCG/2ML IJ SOLN: 25.0000 ug | INTRAMUSCULAR | Status: DC | PRN

## 2015-08-13 MED ORDER — CEFAZOLIN SODIUM-DEXTROSE 2-3 GM-% IV SOLR
2.0000 g | Freq: Four times a day (QID) | INTRAVENOUS | Status: AC
  Administered 2015-08-13 – 2015-08-14 (×3): 2 g via INTRAVENOUS
  Filled 2015-08-13 (×3): qty 50

## 2015-08-13 MED ORDER — ENOXAPARIN SODIUM 40 MG/0.4ML ~~LOC~~ SOLN
40.0000 mg | SUBCUTANEOUS | Status: DC
  Administered 2015-08-14 – 2015-08-16 (×3): 40 mg via SUBCUTANEOUS
  Filled 2015-08-13 (×3): qty 0.4

## 2015-08-13 MED ORDER — MENTHOL 3 MG MT LOZG
1.0000 | LOZENGE | OROMUCOSAL | Status: DC | PRN
  Filled 2015-08-13: qty 9

## 2015-08-13 MED ORDER — PROPOFOL 500 MG/50ML IV EMUL
INTRAVENOUS | Status: DC | PRN
  Administered 2015-08-13: 30 ug/kg/min via INTRAVENOUS

## 2015-08-13 MED ORDER — SODIUM CHLORIDE 0.9 % IV SOLN
INTRAVENOUS | Status: DC
  Administered 2015-08-13 – 2015-08-15 (×3): via INTRAVENOUS

## 2015-08-13 MED ORDER — LACTATED RINGERS IV BOLUS (SEPSIS)
500.0000 mL | Freq: Once | INTRAVENOUS | Status: AC
  Administered 2015-08-13: 500 mL via INTRAVENOUS

## 2015-08-13 MED ORDER — ALUM & MAG HYDROXIDE-SIMETH 200-200-20 MG/5ML PO SUSP: 30.0000 mL | ORAL | Status: DC | PRN

## 2015-08-13 MED ORDER — ENSURE ENLIVE PO LIQD
237.0000 mL | Freq: Two times a day (BID) | ORAL | Status: DC
  Administered 2015-08-15 – 2015-08-16 (×2): 237 mL via ORAL

## 2015-08-13 MED ORDER — METOCLOPRAMIDE HCL 5 MG PO TABS: 5.0000 mg | ORAL_TABLET | Freq: Three times a day (TID) | ORAL | Status: DC | PRN

## 2015-08-13 MED ORDER — BISACODYL 10 MG RE SUPP
10.0000 mg | Freq: Every day | RECTAL | Status: DC | PRN
  Administered 2015-08-15: 10 mg via RECTAL
  Filled 2015-08-13: qty 1

## 2015-08-13 MED ORDER — SODIUM CHLORIDE 0.9 % IJ SOLN
3.0000 mL | Freq: Once | INTRAMUSCULAR | Status: AC
Start: 2015-08-13 — End: 2015-08-13
  Administered 2015-08-13: 3 mL via INTRAVENOUS

## 2015-08-13 MED ORDER — SODIUM CHLORIDE 0.9 % IV SOLN
INTRAVENOUS | Status: DC
  Administered 2015-08-13: 125 mL/h via INTRAVENOUS

## 2015-08-13 MED ORDER — NEOMYCIN-POLYMYXIN B GU 40-200000 IR SOLN
Status: DC | PRN
  Administered 2015-08-13: 2 mL

## 2015-08-13 MED ORDER — HYDROCODONE-ACETAMINOPHEN 5-325 MG PO TABS
1.0000 | ORAL_TABLET | Freq: Four times a day (QID) | ORAL | Status: DC | PRN
  Administered 2015-08-13: 1 via ORAL
  Administered 2015-08-14: 2 via ORAL
  Administered 2015-08-15: 1 via ORAL
  Filled 2015-08-13: qty 1
  Filled 2015-08-13: qty 2
  Filled 2015-08-13: qty 1

## 2015-08-13 MED ORDER — METOCLOPRAMIDE HCL 5 MG/ML IJ SOLN
5.0000 mg | Freq: Three times a day (TID) | INTRAMUSCULAR | Status: DC | PRN
  Administered 2015-08-14: 10 mg via INTRAVENOUS
  Filled 2015-08-13: qty 2

## 2015-08-13 MED ORDER — CEFAZOLIN SODIUM 1-5 GM-% IV SOLN
1.0000 g | Freq: Once | INTRAVENOUS | Status: AC
  Administered 2015-08-13: 1 g via INTRAVENOUS
  Filled 2015-08-13: qty 50

## 2015-08-13 MED ORDER — DOCUSATE SODIUM 100 MG PO CAPS
100.0000 mg | ORAL_CAPSULE | Freq: Two times a day (BID) | ORAL | Status: DC
  Administered 2015-08-13 – 2015-08-16 (×6): 100 mg via ORAL
  Filled 2015-08-13 (×6): qty 1

## 2015-08-13 MED ORDER — MAGNESIUM HYDROXIDE 400 MG/5ML PO SUSP: 30.0000 mL | Freq: Every day | ORAL | Status: DC | PRN

## 2015-08-13 MED ORDER — DEXTROSE-NACL 5-0.45 % IV SOLN
INTRAVENOUS | Status: DC
  Administered 2015-08-13: 16:00:00 via INTRAVENOUS
  Administered 2015-08-13: 125 mL/h via INTRAVENOUS

## 2015-08-13 MED ORDER — DEXTROSE 50 % IV SOLN
12.5000 g | Freq: Once | INTRAVENOUS | Status: AC
  Administered 2015-08-13: 12.5 g via INTRAVENOUS

## 2015-08-13 MED ORDER — PHENOL 1.4 % MT LIQD
1.0000 | OROMUCOSAL | Status: DC | PRN
  Filled 2015-08-13: qty 177

## 2015-08-13 SURGICAL SUPPLY — 34 items
BIT DRILL 4.3MMS DISTAL GRDTED (BIT) ×1 IMPLANT
CANISTER SUCT 1200ML W/VALVE (MISCELLANEOUS) ×3 IMPLANT
CHLORAPREP W/TINT 26ML (MISCELLANEOUS) ×3 IMPLANT
DRAPE SHEET LG 3/4 BI-LAMINATE (DRAPES) ×3 IMPLANT
DRAPE SURG 17X11 SM STRL (DRAPES) ×3 IMPLANT
DRAPE U-SHAPE 47X51 STRL (DRAPES) ×3 IMPLANT
DRILL 4.3MMS DISTAL GRADUATED (BIT) ×3
ELECT CAUTERY BLADE 6.4 (BLADE) ×3 IMPLANT
GAUZE SPONGE 4X4 12PLY STRL (GAUZE/BANDAGES/DRESSINGS) ×3 IMPLANT
GLOVE BIOGEL PI IND STRL 9 (GLOVE) ×1 IMPLANT
GLOVE BIOGEL PI INDICATOR 9 (GLOVE) ×2
GLOVE SURG ORTHO 9.0 STRL STRW (GLOVE) ×3 IMPLANT
GOWN SPECIALTY ULTRA XL (MISCELLANEOUS) ×3 IMPLANT
GOWN STRL REUS W/ TWL LRG LVL3 (GOWN DISPOSABLE) ×1 IMPLANT
GOWN STRL REUS W/TWL LRG LVL3 (GOWN DISPOSABLE) ×2
GUIDEPIN VERSANAIL DSP 3.2X444 ×3 IMPLANT
GUIDEWIRE BALL NOSE 80CM (WIRE) ×3 IMPLANT
HFN LH 130 DEG 11MM X 340MM (Nail) ×3 IMPLANT
HIP FRAC NAIL LAG SCR 10.5X100 (Orthopedic Implant) ×2 IMPLANT
IV NS 500ML (IV SOLUTION) ×2
IV NS 500ML BAXH (IV SOLUTION) ×1 IMPLANT
KIT RM TURNOVER STRD PROC AR (KITS) ×3 IMPLANT
MAT BLUE FLOOR 46X72 FLO (MISCELLANEOUS) ×3 IMPLANT
NEEDLE FILTER BLUNT 18X 1/2SAF (NEEDLE) ×2
NEEDLE FILTER BLUNT 18X1 1/2 (NEEDLE) ×1 IMPLANT
PACK HIP COMPR (MISCELLANEOUS) ×3 IMPLANT
PAD GROUND ADULT SPLIT (MISCELLANEOUS) ×3 IMPLANT
SCREW BONE CORTICAL 5.0X3 (Screw) ×3 IMPLANT
SCREW CANN THRD AFF 10.5X100 (Orthopedic Implant) ×1 IMPLANT
STAPLER SKIN PROX 35W (STAPLE) ×3 IMPLANT
SUT VIC AB 1 CT1 36 (SUTURE) ×3 IMPLANT
SUT VIC AB 2-0 CT1 (SUTURE) ×3 IMPLANT
SYRINGE 10CC LL (SYRINGE) ×3 IMPLANT
TAPE MICROFOAM 4IN (TAPE) ×3 IMPLANT

## 2015-08-13 NOTE — Progress Notes (Signed)
Dr. Anne HahnWillis notified about pt. Being NPO for surgery and ordered levemier. He ordered for insulin to be held.

## 2015-08-13 NOTE — Anesthesia Procedure Notes (Signed)
Spinal Patient location during procedure: OR Staffing Anesthesiologist: Dana Riddle, Dana Riddle Performed by: anesthesiologist  Preanesthetic Checklist Completed: patient identified, site marked, surgical consent, pre-op evaluation, timeout performed, IV checked and risks and benefits discussed Spinal Block Patient position: sitting Prep: Betadine Patient monitoring: heart rate, cardiac monitor, continuous pulse ox and blood pressure Approach: midline Location: L3-4 Injection technique: single-shot Needle Needle type: Pencil-Tip  Needle gauge: 25 G Needle length: 9 cm Assessment Sensory level: T10 Additional Notes 1615 marcaine 12.5mg 

## 2015-08-13 NOTE — Progress Notes (Signed)
[facility] [place] at [facility]   PATIENT NAME: Dana Riddle    MR#:  [id number]  DATE OF BIRTH:  [birth year]  SUBJECTIVE:  CHIEF COMPLAINT:   Chief Complaint  Patient presents with  . Emesis   patient is a 81 year old female with past history significant for history of  Right hip fracture, hypertension, diabetes.  Alzheimer's dementia who presents to the hospital due to emesis. On arrival to the hospital, she complained of hip pain and was noted to have left hip fracture. Her labs revealed anemia, hyponatremia, pyuria, concerning for urinary tract infection. Patient was not able to provide review of systems due to dementia. Blood pressure was noted to be low in 80s to 90s. Due to anemia and hypotension. Patient was transfused 1 unit of packed red blood cells perioperatively with improvement of hemoglobin level to 10.5 and improvement of blood pressure. Today she feels comfortable. Denies pain. She is to undergo ORIF of her left hip fracture  Review of Systems  Unable to perform ROS: dementia    VITAL SIGNS: Blood pressure 99/48, pulse 73, temperature 98.7 F (37.1 C), temperature source Oral, resp. rate 18, height  (1.6 m), weight 42.185 kg (93 lb), SpO2 97 %.  PHYSICAL EXAMINATION:   GENERAL:  81 y.o.-year-old patient lying in the bed with no acute distress.  EYES: Pupils equal, round, reactive to light and accommodation. No scleral icterus. Extraocular muscles intact.  HEENT: Head atraumatic, normocephalic. Oropharynx and nasopharynx clear.  NECK:  Supple, no jugular venous distention. No thyroid enlargement, no tenderness.  LUNGS: Normal breath sounds bilaterally, no wheezing, rales,rhonchi or crepitation. No use of accessory muscles of respiration.  CARDIOVASCULAR: S1, S2 normal. No murmurs, rubs, or gallops.  ABDOMEN: Soft, nontender, nondistended. Bowel sounds present. No organomegaly or mass.  EXTREMITIES: No pedal edema, cyanosis, or  clubbing.  NEUROLOGIC: Cranial nerves II through XII are intact. Muscle strength 5/5 in all extremities. Sensation intact. Gait not checked.  PSYCHIATRIC: The patient is alert but not oriented , intermittently follows commands. Minimal verbal interaction.  SKIN: No obvious rash, lesion, or ulcer.   ORDERS/RESULTS REVIEWED:   CBC  Recent Labs Lab 08/11/15 1134 08/12/15 0356 08/12/15 1140 08/12/15 2020  WBC 13.7* 9.5  --   --   HGB 10.7* 8.8* 9.1* 10.5*  HCT 32.3* 26.1* 27.4* 31.0*  PLT 333 273  --   --   MCV 91.7 93.1  --   --   MCH 30.3 31.6  --   --   MCHC 33.0 33.9  --   --   RDW 14.5 14.2  --   --    ------------------------------------------------------------------------------------------------------------------  Chemistries   Recent Labs Lab 08/11/15 1134 08/11/15 1617 08/12/15 0356 08/13/15 0702  NA 125*  --  130* 130*  K 4.6  --  4.3 3.9  CL 91*  --  97* 100*  CO2 26  --  27 24  GLUCOSE 252*  --  219* 124*  BUN 26*  --  20 13  CREATININE 0.88  --  0.79 0.61  CALCIUM 8.5*  --  7.9* 8.0*  MG  --  1.7  --   --   AST 11*  --   --   --   ALT 9*  --   --   --   ALKPHOS 188*  --   --   --   BILITOT 1.2  --   --   --    ------------------------------------------------------------------------------------------------------------------ estimated  creatinine clearance is 34.9 mL/min (by C-G formula based on Cr of 0.61). ------------------------------------------------------------------------------------------------------------------ No results for input(s): TSH, T4TOTAL, T3FREE, THYROIDAB in the last 72 hours.  Invalid input(s): FREET3  Cardiac Enzymes  Recent Labs Lab 08/11/15 1134  TROPONINI <0.03   ------------------------------------------------------------------------------------------------------------------ Invalid input(s):  POCBNP ---------------------------------------------------------------------------------------------------------------  RADIOLOGY: Dg Chest 1 View  08/11/2015  CLINICAL DATA:  Vomiting today with left hip pain.  Known fracture. EXAM: CHEST 1 VIEW COMPARISON:  07/11/2015 and 07/09/2015 as well as 05/31/2013 FINDINGS: Lungs are adequately inflated with resolution of the previous noted bibasilar airspace process. No evidence of effusion. Cardiomediastinal silhouette is within normal. There is calcified plaque over the thoracic aorta. Remainder of the exam is unchanged. IMPRESSION: No acute cardiopulmonary disease. Interval resolution of bibasilar airspace process. Electronically Signed   By: Elberta Fortisaniel  Boyle M.D.   On: 08/11/2015 17:45   Dg Hip Unilat With Pelvis 2-3 Views Left  08/11/2015  CLINICAL DATA:  Left hip pain post fall today. EXAM: DG HIP (WITH OR WITHOUT PELVIS) 2-3V LEFT COMPARISON:  07/09/2015 and 07/10/2015 FINDINGS: There are mild symmetric degenerative changes of the hips. Fixation hardware over the right proximal femur is intact fixating recent intertrochanteric fracture with anatomic alignment over the fracture site. There is a new displaced left trochanteric fracture with mild comminution. Exaggerated coxa vera deformity and mild impaction about the fracture site. Remainder the exam is unchanged. IMPRESSION: New displaced slightly comminuted left intertrochanteric fracture. Fixation hardware intact bridging recent right intertrochanteric fracture with anatomic alignment about the fracture site. Electronically Signed   By: Elberta Fortisaniel  Boyle M.D.   On: 08/11/2015 14:18    EKG:  Orders placed or performed during the hospital encounter of 08/11/15  . ED EKG  . ED EKG    ASSESSMENT AND PLAN:  Principal Problem:   Closed left hip fracture (HCC) Active Problems:   UTI (lower urinary tract infection)   Hyponatremia  1. closed left intratrochanteric hip fracture, initial encounter,  orthopedic surgery is planning ORIF today , continue supportive therapy, pain control, physical therapy after procedure, likely skilled nursing facility discharge. 2. Hypotension, unclear etiology, possibly due to anemia versus urinary tract infection/sepsis related, continue IV fluids, patient was transfused 1 unit of packed red blood cells, improved blood pressure readings  3. Hyponatremia. No improvement with IV fluid administration, initially thought to be dehydration related, but most likely SIADH 4. Acute cystitis without hematuria, urine cultures are taken, pending. Continue patient on Rocephin for now, follow culture results and adjust antibiotics depending on culture results 5. Anemia, likely anemia of chronic disease. Status post transfusion of 1 unit of packed red blood cells preoperatively, follow tomorrow in the morning   Management plans discussed with the patient, family and they are in agreement.   DRUG ALLERGIES: No Known Allergies  CODE STATUS:     Code Status Orders        Start     Ordered   08/11/15 1605  Full code   Continuous     08/11/15 1604      TOTAL TIME TAKING CARE OF THIS PATIENT: 35 minutes.    Katharina CaperVAICKUTE,Ragen Laver M.D on 08/13/2015 at 1:42 PM  Between 7am to 6pm - Pager - [phone]  After 6pm go to [email or web address] - password [email or web address]  [facility]  Office  [phone]  CC: Primary care physician; Vonita MossMark Crissman, MD

## 2015-08-13 NOTE — Progress Notes (Signed)
Clinical Child psychotherapistocial Worker (CSW) contacted patient's DSS Juliane PootGuardian Kay and presented bed offers. Kay chose [facility]. Plan is for patient to have surgery today around 4 pm for a hip fracture. [facility] liaison is aware of accepted bed offer. CSW will continue to follow and assist as needed.   Jetta LoutBailey Morgan, LCSWA [phone]

## 2015-08-13 NOTE — Progress Notes (Signed)
Dr Joice LoftsPoggi will be doing procedure instead, I tried calling adrian daye to update consent, i left a voicemail. Waiting on callback.

## 2015-08-13 NOTE — Progress Notes (Signed)
LR bolus given for low blood pressure

## 2015-08-13 NOTE — Progress Notes (Signed)
Initial Nutrition Assessment    INTERVENTION:   Meals and Snacks: Cater to patient preferences; if po intake remains inadequate, recommend liberalizing diet to regular Medical Food Supplement Therapy: recommend addition of Ensure BID  NUTRITION DIAGNOSIS:   Inadequate oral intake related to poor appetite as evidenced by percent weight loss.  GOAL:   Patient will meet greater than or equal to 90% of their needs  MONITOR:    (Energy Intake, Anthropometrics, Digestive System)  REASON FOR ASSESSMENT:   Malnutrition Screening Tool    ASSESSMENT:    Pt admitted with left hip fracture, vomiting, plan for ORIF today. Noted recent hx of right hip fractre  Past Medical History  Diagnosis Date  . Diabetes mellitus without complication (HCC)   . Hyperlipidemia   . Hypertension   . Chronic kidney disease   . Alzheimer's dementia    Past Surgical History  Procedure Laterality Date  . Intramedullary (im) nail intertrochanteric Right 07/10/2015    Procedure: INTRAMEDULLARY (IM) NAIL INTERTROCHANTRIC;  Surgeon: Donato HeinzJames P Hooten, MD;  Location: [facility] ORS;  Service: Orthopedics;  Laterality: Right;     Diet Order:  Diet NPO time specified   Energy Intake: recorded po intake 32% of meals on average  Food and Nutrition related history: unable to assess  Nutrition Focused physical exam Unable to complete Nutrition-Focused physical exam at this time.   Protein Profile:   Recent Labs Lab 08/11/15 1134  ALBUMIN 2.9*   Electrolyte and Renal Profile:  Recent Labs Lab 08/11/15 1134 08/11/15 1617 08/12/15 0356 08/13/15 0702  BUN 26*  --  20 13  CREATININE 0.88  --  0.79 0.61  NA 125*  --  130* 130*  K 4.6  --  4.3 3.9  MG  --  1.7  --   --    Glucose Profile:   Recent Labs  08/12/15 2106 08/13/15 0718 08/13/15 1116  GLUCAP 115* 130* 101*   Meds: ss novolog, levemir, remeron  Height:   Ht Readings from Last 1 Encounters:  08/13/15 5\' 3"  (1.6 m)    Weight:  11.4% wt loss in 1 month; noted pt with right hip fracture in October of this year  Wt Readings from Last 1 Encounters:  08/13/15 93 lb (42.185 kg)    Wt Readings from Last 10 Encounters:  08/13/15 93 lb (42.185 kg)  07/09/15 105 lb (47.628 kg)  04/04/15 100 lb (45.36 kg)  04/02/15 100 lb 14.4 oz (45.768 kg)  03/27/15 104 lb (47.174 kg)  03/21/15 104 lb (47.174 kg)  03/12/15 104 lb (47.174 kg)  11/05/14 106 lb (48.081 kg)    BMI:  Body mass index is 16.48 kg/(m^2).  Estimated Nutritional Needs:   Kcal:  [id number] kcals (BEE 842, 1.3 AF, 1.1-1.2 IF)   Protein:  46-55 g (1.1-1.3 g/kg)   Fluid:  1260-1470 mL (30-35 ml/kg)   HIGH Care Level  Romelle Starcherate Addis Tuohy MS, RD, LDN [phone] Pager

## 2015-08-13 NOTE — Anesthesia Preprocedure Evaluation (Addendum)
Anesthesia Evaluation  Patient identified by MRN, date of birth, ID band Patient awake    Reviewed: Allergy & Precautions, NPO status , Patient's Chart, lab work & pertinent test results, reviewed documented beta blocker date and time   Airway Mallampati: II  TM Distance: >3 FB     Dental  (+) Chipped   Pulmonary pneumonia, resolved, Current Smoker,           Cardiovascular hypertension, Pt. on medications      Neuro/Psych    GI/Hepatic   Endo/Other  diabetes  Renal/GU Renal InsufficiencyRenal disease     Musculoskeletal   Abdominal   Peds  Hematology   Anesthesia Other Findings BS was 79 preop. Half D50 given. D5ns ordered. BP low. EKG shows poor R wave progression, otherwise OK. Anemia 10.5.  Reproductive/Obstetrics                            Anesthesia Physical Anesthesia Plan  ASA: III  Anesthesia Plan: Spinal   Post-op Pain Management:    Induction:   Airway Management Planned:   Additional Equipment:   Intra-op Plan:   Post-operative Plan:   Informed Consent: I have reviewed the patients History and Physical, chart, labs and discussed the procedure including the risks, benefits and alternatives for the proposed anesthesia with the patient or authorized representative who has indicated his/her understanding and acceptance.     Plan Discussed with: CRNA  Anesthesia Plan Comments:         Anesthesia Quick Evaluation

## 2015-08-13 NOTE — Progress Notes (Signed)
SBP ranges 87 to 97  Pt has had 500cc bolus of LR    Dr Karlton Lemonkarenz called no new orders pt pressures have been in low 90's

## 2015-08-13 NOTE — Op Note (Signed)
08/11/2015 - 08/13/2015  5:01 PM  PATIENT:  Dana BaizeJosephine E Bertholf  81 y.o. female  PRE-OPERATIVE DIAGNOSIS:  HIP FRACTURE  left intertrochanteric hip fracture  POST-OPERATIVE DIAGNOSIS:  HIP FRACTURE same  PROCEDURE:  Procedure(s): INTRAMEDULLARY (IM) NAIL FEMORAL (Left)  SURGEON: Leitha SchullerMichael J Shanesha Bednarz, MD  ASSISTANTS: None  ANESTHESIA:   spinal  EBL:  Total I/O In: 3391.3 [I.V.:3391.3] Out: 675 [Urine:475; Blood:200]    DRAINS: none   LOCAL MEDICATIONS USED:  NONE  SPECIMEN:  No Specimen  DISPOSITION OF SPECIMEN:  N/A  COUNTS:  YES  TOURNIQUET:  * No tourniquets in log *  IMPLANTS: Biomet affixes on by 340 left rod with 105 mm lag screw 34 mm distal interlocking screw  DICTATION: .Dragon Dictation patient brought the operating room and after adequate anesthesia was obtained, the patient was transferred to the fracture table. Right leg was placed in well-leg holder left leg in the traction boot with slight traction applied. C-arm was brought in and good visualization of both AP and lateral projections was obtained. After patient after prepping and draping using a Barrier drape method, appropriate patient identification and timeout procedures were completed. A small incision was made proximal to the greater trochanter and a guidewire inserted into the center of the trochanter on AP and lateral projections. Proximal reaming was carried out and a guide were inserted down the canal reaming was carried out to 13 mm in length determine from the guidewire. A 11 x 340 mm rod was inserted the appropriate depth. A small lateral incision was made and a guide were inserted into near center center position. Measurement was made and drilling carried out with a lag screw inserted. The this was tightened proximally and quarter turn to loosen to allow for compression. The proximal insertion handle was removed and AP and lateral projections saved going distally the oblique screw hole was filled with a  single 5.0 cortical screw making a small incision drilling and placing the screw. The wounds were then irrigated after x-rays saved with #1 Vicryl for the deep fascia to elective substantially and skin staples conical dressing applied  PLAN OF CARE: Continue as inpatient  PATIENT DISPOSITION:  PACU - hemodynamically stable.

## 2015-08-13 NOTE — OR Nursing (Signed)
Patient arrived in SDS to await surgery.  Iv heplocks flushed and a main line initiated on left heplock.  Urine appears cloudy and very dark.  UO = 375cc.  Patient repositioned in  Bed, extra blankets provided.  Blood sugar was 79. Patient remains quiet but states that she is without pain.

## 2015-08-13 NOTE — Transfer of Care (Signed)
Immediate Anesthesia Transfer of Care Note  Patient: Dana Riddle  Procedure(s) Performed: Procedure(s): INTRAMEDULLARY (IM) NAIL FEMORAL (Left)  Patient Location: PACU  Anesthesia Type:Spinal  Level of Consciousness: sedated  Airway & Oxygen Therapy: Patient Spontanous Breathing and Patient connected to face mask oxygen  Post-op Assessment: Report given to RN and Post -op Vital signs reviewed and stable  Post vital signs: Reviewed and stable  Last Vitals:  Filed Vitals:   08/13/15 1503  BP: 124/68  Pulse:   Temp:   Resp:     Complications: No apparent anesthesia complications

## 2015-08-13 NOTE — Progress Notes (Signed)
Pt has alzheimer and can not tell level of spinal and does not follow commands to move feet

## 2015-08-14 ENCOUNTER — Encounter: Payer: Self-pay | Admitting: Orthopedic Surgery

## 2015-08-14 LAB — CBC
HEMATOCRIT: 29.3 % — AB (ref 35.0–47.0)
Hemoglobin: 9.9 g/dL — ABNORMAL LOW (ref 12.0–16.0)
MCH: 30.8 pg (ref 26.0–34.0)
MCHC: 33.7 g/dL (ref 32.0–36.0)
MCV: 91.4 fL (ref 80.0–100.0)
PLATELETS: 304 10*3/uL (ref 150–440)
RBC: 3.21 MIL/uL — ABNORMAL LOW (ref 3.80–5.20)
RDW: 14.5 % (ref 11.5–14.5)
WBC: 11.1 10*3/uL — AB (ref 3.6–11.0)

## 2015-08-14 LAB — BASIC METABOLIC PANEL
ANION GAP: 5 (ref 5–15)
BUN: 11 mg/dL (ref 6–20)
CALCIUM: 7.8 mg/dL — AB (ref 8.9–10.3)
CO2: 27 mmol/L (ref 22–32)
CREATININE: 0.71 mg/dL (ref 0.44–1.00)
Chloride: 99 mmol/L — ABNORMAL LOW (ref 101–111)
Glucose, Bld: 211 mg/dL — ABNORMAL HIGH (ref 65–99)
Potassium: 4 mmol/L (ref 3.5–5.1)
Sodium: 131 mmol/L — ABNORMAL LOW (ref 135–145)

## 2015-08-14 LAB — OSMOLALITY, URINE: Osmolality, Ur: 447 mOsm/kg (ref 300–900)

## 2015-08-14 LAB — GLUCOSE, CAPILLARY
GLUCOSE-CAPILLARY: 197 mg/dL — AB (ref 65–99)
GLUCOSE-CAPILLARY: 102 mg/dL — AB (ref 65–99)
GLUCOSE-CAPILLARY: 59 mg/dL — AB (ref 65–99)
GLUCOSE-CAPILLARY: 108 mg/dL — AB (ref 65–99)
Glucose-Capillary: 79 mg/dL (ref 65–99)
Glucose-Capillary: 189 mg/dL — ABNORMAL HIGH (ref 65–99)
Glucose-Capillary: 71 mg/dL (ref 65–99)

## 2015-08-14 LAB — OSMOLALITY: OSMOLALITY: 267 mosm/kg — AB (ref 275–295)

## 2015-08-14 LAB — TSH: TSH: 3.86 u[IU]/mL (ref 0.350–4.500)

## 2015-08-14 LAB — CORTISOL: Cortisol, Plasma: 14.7 ug/dL

## 2015-08-14 NOTE — Progress Notes (Signed)
Physical Therapy Treatment Patient Details Name: Dana Riddle MRN: [id number] DOB: 04-15-1929 Today's Date: 08/14/2015    History of Present Illness Patient is an 81 y.o. female presenting to hospital (from [facility]) with emesis and c/o hip pain.  Pt found to have L hip fx and UTI and transfused 1 unit PRBC's prior to surgery.  Pt s/p L IMN d/t intertrochanteric hip fx 08/13/15.  Of note, pt with recent R hip fx (s/p IMN intertrochanteric 07/10/15 by Dr. Ernest Pine).  PMH includes Alzheimer's dementia.    PT Comments    Pt appearing fatigued this afternoon (pt already back in bed with nursing assist) and only agreeable to minimal ex's in bed this afternoon.  Will continue to progress pt per pt tolerance.  Follow Up Recommendations  SNF     Equipment Recommendations   (TBD)    Recommendations for Other Services       Precautions / Restrictions Precautions Precautions: Fall Restrictions Weight Bearing Restrictions: Yes RLE Weight Bearing: Weight bearing as tolerated LLE Weight Bearing: Weight bearing as tolerated    Mobility  Bed Mobility             Transfers               Ambulation/Gait           Stairs            Wheelchair Mobility    Modified Rankin (Stroke Patients Only)       Balance                           Cognition Arousal/Alertness: Awake/alert Behavior During Therapy: Agitated;Flat affect (pt appearing to get mildly agitated at times) Overall Cognitive Status: No family/caregiver present to determine baseline cognitive functioning (Oriented to self only)       Memory: Decreased recall of precautions              Exercises   Performed semi-supine B LE therapeutic exercise x 10 reps:  Ankle pumps (AAROM B LE's); quad sets x3 second holds (AROM B LE's with max vc's); SAQ's (AAROM R; AAROM L); heelslides (AAROM R; AAROM L), hip abd/adduction (AAROM R; AAROM L).  Pt required vc's and tactile cues  for correct technique with exercises.     General Comments General comments (skin integrity, edema, etc.): L LE dressings in place  Nursing cleared pt for participation in physical therapy.  Pt agreeable to limited PT session.      Pertinent Vitals/Pain Pain Assessment: Faces Pain Score: 6  Faces Pain Scale: Hurts whole lot Pain Location: L hip with movement (pt reports no pain at rest) Pain Descriptors / Indicators: Sharp;Shooting Pain Intervention(s): Limited activity within patient's tolerance;Monitored during session;Repositioned    Home Living Family/patient expects to be discharged to:: Assisted living               Additional Comments: Pt is poor historian and unable to give home living details (per chart, pt from [facility]).    Prior Function        Comments: Pt reports being independent without AD but appears confused and d/t recent R hip fx s/p surgery, anticipate pt would be using an AD.   PT Goals (current goals can now be found in the care plan section) Acute Rehab PT Goals Patient Stated Goal: pt did not state any goals PT Goal Formulation: With patient Progress towards PT goals: Progressing toward goals (  with LE ex's)    Frequency  BID    PT Plan Current plan remains appropriate    Co-evaluation             End of Session Equipment Utilized During Treatment: Gait belt Activity Tolerance: Patient limited by pain Patient left: in bed;with call bell/phone within reach;with bed alarm set;with SCD's reapplied (B heels elevated via towel rolls)     Time: [id number] PT Time Calculation (min) (ACUTE ONLY): 10 min  Charges:  $Therapeutic Exercise: 8-22 mins                    G CodesHendricks Limes:      Rusty Villella 08/14/2015, 4:08 PM Hendricks LimesEmily Karene Bracken, PT [phone]

## 2015-08-14 NOTE — Anesthesia Postprocedure Evaluation (Signed)
  Anesthesia Post-op Note  Patient: Dana HuddleJosephine E Riddle  Procedure(s) Performed: Procedure(s): INTRAMEDULLARY (IM) NAIL FEMORAL (Left)  Anesthesia type:Spinal  Patient location: Inpt Rm 154  Post pain: Pain level controlled  Post assessment: Post-op Vital signs reviewed, Patient's Cardiovascular Status Stable, Respiratory Function Stable, Patent Airway and No signs of Nausea or vomiting  Post vital signs: Reviewed and stable  Last Vitals:  Filed Vitals:   08/14/15 0756  BP: 114/54  Pulse: 91  Temp: 37.2 C  Resp: 18    Level of consciousness: baseline dementia, pleasant, appears comfortable  Complications: No apparent anesthesia complications

## 2015-08-14 NOTE — Progress Notes (Addendum)
[facility] [place] at [facility]   PATIENT NAME: Dana Riddle    MR#:  [id number]  DATE OF BIRTH:  04/10/1929  SUBJECTIVE:  CHIEF COMPLAINT:   Chief Complaint  Patient presents with  . Emesis   patient is a 81 year old female with past history significant for history of  Right hip fracture, hypertension, diabetes.  Alzheimer's dementia who presents to the hospital due to emesis. On arrival to the hospital, she complained of hip pain and was noted to have left hip fracture. Her labs revealed anemia, hyponatremia, pyuria, concerning for urinary tract infection. Patient was not able to provide review of systems due to dementia. Blood pressure was noted to be low in 80s to 90s. Due to anemia and hypotension. Patient was transfused 1 unit of packed red blood cells perioperatively with improvement of hemoglobin level to 10.5 and improvement of blood pressure. Today she feels comfortable. Denies pain. The patient underwent ORIF of her left hip fracture 15th of November 2016 by Dr. Rosita Kea  Review of Systems  Unable to perform ROS: dementia    VITAL SIGNS: Blood pressure 114/54, pulse 91, temperature 98.9 F (37.2 C), temperature source Oral, resp. rate 18, height  (1.6 m), weight 42.185 kg (93 lb), SpO2 97 %.  PHYSICAL EXAMINATION:   GENERAL:  81 y.o.-year-old patient lying in the bed with no acute distress.  EYES: Pupils equal, round, reactive to light and accommodation. No scleral icterus. Extraocular muscles intact.  HEENT: Head atraumatic, normocephalic. Oropharynx and nasopharynx clear.  NECK:  Supple, no jugular venous distention. No thyroid enlargement, no tenderness.  LUNGS: Normal breath sounds bilaterally, no wheezing, rales,rhonchi or crepitation. No use of accessory muscles of respiration.  CARDIOVASCULAR: S1, S2 normal. No murmurs, rubs, or gallops.  ABDOMEN: Soft, nontender, nondistended. Bowel sounds present. No organomegaly or mass.   EXTREMITIES: No pedal edema, cyanosis, or clubbing.  NEUROLOGIC: Cranial nerves II through XII are intact. Muscle strength 5/5 in all extremities. Sensation intact. Gait not checked.  PSYCHIATRIC: The patient is alert but not oriented , intermittently follows commands. Minimal verbal interaction.  SKIN: No obvious rash, lesion, or ulcer. Left hip area has few incision sites, which are dressed. No bleeding, minimal swelling and discomfort on palpation  ORDERS/RESULTS REVIEWED:   CBC  Recent Labs Lab 08/11/15 1134 08/12/15 0356 08/12/15 1140 08/12/15 2020 08/14/15 0653  WBC 13.7* 9.5  --   --  11.1*  HGB 10.7* 8.8* 9.1* 10.5* 9.9*  HCT 32.3* 26.1* 27.4* 31.0* 29.3*  PLT 333 273  --   --  304  MCV 91.7 93.1  --   --  91.4  MCH 30.3 31.6  --   --  30.8  MCHC 33.0 33.9  --   --  33.7  RDW 14.5 14.2  --   --  14.5   ------------------------------------------------------------------------------------------------------------------  Chemistries   Recent Labs Lab 08/11/15 1134 08/11/15 1617 08/12/15 0356 08/13/15 0702 08/14/15 0653  NA 125*  --  130* 130* 131*  K 4.6  --  4.3 3.9 4.0  CL 91*  --  97* 100* 99*  CO2 26  --  GLUCOSE 252*  --  219* 124* 211*  BUN 26*  --  CREATININE 0.88  --  0.79 0.61 0.71  CALCIUM 8.5*  --  7.9* 8.0* 7.8*  MG  --  1.7  --   --   --   AST 11*  --   --   --   --  ALT 9*  --   --   --   --   ALKPHOS 188*  --   --   --   --   BILITOT 1.2  --   --   --   --    ------------------------------------------------------------------------------------------------------------------ estimated creatinine clearance is 34.9 mL/min (by C-G formula based on Cr of 0.71). ------------------------------------------------------------------------------------------------------------------ No results for input(s): TSH, T4TOTAL, T3FREE, THYROIDAB in the last 72 hours.  Invalid input(s): FREET3  Cardiac Enzymes  Recent Labs Lab  08/11/15 1134  TROPONINI <0.03   ------------------------------------------------------------------------------------------------------------------ Invalid input(s): POCBNP ---------------------------------------------------------------------------------------------------------------  RADIOLOGY: Dg C-arm 61-120 Min  08/13/2015  CLINICAL DATA:  Left intratrochanteric fracture EXAM: LEFT FEMUR 2 VIEWS; DG C-ARM 61-120 MIN COMPARISON:  08/11/2015 FLUOROSCOPY TIME:  Radiation Exposure Index (as provided by the fluoroscopic device): 3.9 mGy If the device does not provide the exposure index: Fluoroscopy Time:  1 minutes 9 seconds Number of Acquired Images:  3 FINDINGS: Proximal medullary rod with compression screw is noted. Fracture fragments are in near anatomic alignment. IMPRESSION: Status post ORIF of proximal left femoral fracture. Electronically Signed   By: Alcide CleverMark  Lukens M.D.   On: 08/13/2015 17:06   Dg Femur Min 2 Views Left  08/13/2015  CLINICAL DATA:  Left intratrochanteric fracture EXAM: LEFT FEMUR 2 VIEWS; DG C-ARM 61-120 MIN COMPARISON:  08/11/2015 FLUOROSCOPY TIME:  Radiation Exposure Index (as provided by the fluoroscopic device): 3.9 mGy If the device does not provide the exposure index: Fluoroscopy Time:  1 minutes 9 seconds Number of Acquired Images:  3 FINDINGS: Proximal medullary rod with compression screw is noted. Fracture fragments are in near anatomic alignment. IMPRESSION: Status post ORIF of proximal left femoral fracture. Electronically Signed   By: Alcide CleverMark  Lukens M.D.   On: 08/13/2015 17:06    EKG:  Orders placed or performed during the hospital encounter of 08/11/15  . ED EKG  . ED EKG    ASSESSMENT AND PLAN:  Principal Problem:   Closed left hip fracture (HCC) Active Problems:   UTI (lower urinary tract infection)   Hyponatremia  1. closed left intratrochanteric hip fracture, status post ORIF 08/13/2015 with Dr. Rosita KeaMenz, continue supportive therapy, pain control,  physical therapy ,  skilled nursing facility discharge likely in the next few days. 2. Hypotension, unclear etiology, possibly due to anemia versus urinary tract infection/sepsis related, resolved with blood transfusion , continue IV fluids until by mouth intake is good 3. Hyponatremia. Minimal improvement with IV fluid administration, initially thought to be dehydration related, but most likely SIADH, getting TSH as well as cortisol levels and osmolarities of urine and serum 4. Sterile pyuria , , urine cultures revealed yeast , discontinue Rocephin  5. Anemia, likely anemia of chronic disease. Status post transfusion of 1 unit of packed red blood cells preoperatively, following  closely with hydration 6CKD stage 3, stable   Management plans discussed with the patient, family and they are in agreement.   DRUG ALLERGIES: No Known Allergies  CODE STATUS:     Code Status Orders        Start     Ordered   08/11/15 1605  Full code   Continuous     08/11/15 1604      TOTAL TIME TAKING CARE OF THIS PATIENT: 35 minutes.    Katharina CaperVAICKUTE,Briasia Flinders M.D on 08/14/2015 at 2:42 PM  Between 7am to 6pm - Pager - [phone]  After 6pm go to [email or web address] - Scientist, research (life sciences)password [email or web address]  [facility]  Office  [phone]  CC: Primary care physician; Vonita Moss, MD

## 2015-08-14 NOTE — Evaluation (Signed)
Occupational Therapy Evaluation Patient Details Name: Dana Riddle MRN: [id number] DOB: Jul 25, 1929 Today's Date: 08/14/2015    History of Present Illness This patient is an 81 year old female who had a fall at her assisted living facility [facility]) and suffered a hip fracture. She recieved an ORIF repair.   Clinical Impression   Patient is an 81 year old female with the above history. She lives at [facility] assisted [facility]. Unable to determine how much assistance she gets. She says she is independent but is confused. She would benefit from Occupational Therapy for ADL/functioal mobility training      Follow Up Recommendations  SNF    Equipment Recommendations       Recommendations for Other Services       Precautions / Restrictions        Mobility Bed Mobility                  Transfers                      Balance                                            ADL                                         General ADL Comments: Unable to practice ADL secondary to nausea and vomiting.     Vision     Perception     Praxis      Pertinent Vitals/Pain Pain Assessment: No/denies pain     Hand Dominance     Extremity/Trunk Assessment Upper Extremity Assessment Upper Extremity Assessment:  (B UE 3/5 for elbows. Shoulders limited to 30o of flexion.)   Lower Extremity Assessment Lower Extremity Assessment: Defer to PT evaluation       Communication Communication Communication: [facility]   Cognition Arousal/Alertness: Awake/alert                         General Comments       Exercises       Shoulder Instructions      Home Living Family/patient expects to be discharged to:: Assisted living                                        Prior Functioning/Environment          Comments: She reports independence but is confused and may not be accurate.    OT Diagnosis:  Acute pain   OT Problem List:     OT Treatment/Interventions: Self-care/ADL training    OT Goals(Current goals can be found in the care plan section) Acute Rehab OT Goals Patient Stated Goal: Unable to state Time For Goal Achievement: 08/28/15  OT Frequency: Min 1X/week   Barriers to D/C:            Co-evaluation              End of Session Equipment Utilized During Treatment:  (hip kit)  Activity Tolerance: Other (comment) (Limited by nausea) Patient left: in chair;with call bell/phone within reach;with chair alarm set   Time:  0940-0050 OT Time Calculation (min): 13 min Charges:  OT General Charges $OT Visit: 1 Procedure OT Evaluation $Initial OT Evaluation Tier I: 1 Procedure G-Codes:    Myrene Galas, MS/OTR/L  08/14/2015, 11:37 AM

## 2015-08-14 NOTE — Progress Notes (Signed)
  Subjective: 1 Day Post-Op Procedure(s) (LRB): INTRAMEDULLARY (IM) NAIL FEMORAL (Left) Patient reports pain as unable to verbalize pain level, patient is severely demented..   Patient is well, and has had no acute complaints or problems Plan is to go Skilled nursing facility after hospital stay. Negative for chest pain and shortness of breath Fever: no Gastrointestinal:Negative for nausea and vomiting  Objective: Vital signs in last 24 hours: Temp:  [97.2 F (36.2 C)-99.7 F (37.6 C)] 99 F (37.2 C) (11/16 0324) Pulse Rate:  [62-93] 86 (11/16 0324) Resp:  [11-20] 18 (11/16 0324) BP: (81-142)/(40-70) 110/54 mmHg (11/16 0324) SpO2:  [9 %-100 %] 96 % (11/16 0324) FiO2 (%):  [21 %] 21 % (11/15 1657) Weight:  [42.185 kg (93 lb)] 42.185 kg (93 lb) (11/15 1500)  Intake/Output from previous day:  Intake/Output Summary (Last 24 hours) at 08/14/15 0746 Last data filed at 08/14/15 0600  Gross per 24 hour  Intake 6011.25 ml  Output   1375 ml  Net 4636.25 ml    Intake/Output this shift:    Labs:  Recent Labs  08/11/15 1134 08/12/15 0356 08/12/15 1140 08/12/15 2020 08/14/15 0653  HGB 10.7* 8.8* 9.1* 10.5* 9.9*    Recent Labs  08/12/15 0356  08/12/15 2020 08/14/15 0653  WBC 9.5  --   --  11.1*  RBC 2.80*  --   --  3.21*  HCT 26.1*  < > 31.0* 29.3*  PLT 273  --   --  304  < > = values in this interval not displayed.  Recent Labs  08/13/15 0702 08/14/15 0653  NA 130* 131*  K 3.9 4.0  CL 100* 99*  CO2 24 27  BUN 13 11  CREATININE 0.61 0.71  GLUCOSE 124* 211*  CALCIUM 8.0* 7.8*   No results for input(s): LABPT, INR in the last 72 hours.   EXAM General - Patient is Alert, Disorganized, Confused and Lacking, patient is pleasantly demented. Extremity - ABD soft Sensation intact distally Intact pulses distally Dorsiflexion/Plantar flexion intact Incision: dressing C/D/I Dressing/Incision - clean, dry, no drainage Motor Function - intact, moving foot and  toes well on exam.   Abdomen is soft with normal BS.  Past Medical History  Diagnosis Date  . Diabetes mellitus without complication (HCC)   . Hyperlipidemia   . Hypertension   . Chronic kidney disease   . Alzheimer's dementia     Assessment/Plan: 1 Day Post-Op Procedure(s) (LRB): INTRAMEDULLARY (IM) NAIL FEMORAL (Left) Principal Problem:   Closed left hip fracture (HCC) Active Problems:   UTI (lower urinary tract infection)   Hyponatremia  Estimated body mass index is 16.48 kg/(m^2) as calculated from the following:   Height as of this encounter: 5\' 3"  (1.6 m).   Weight as of this encounter: 42.185 kg (93 lb). Advance diet Up with therapy   Na+ 131, up from 130.  Pt on IVF currently, likely SIADH. Hg 9.9 this AM, not complaining of any dizziness. Pt has history of alzheimers, unable to verbalize answers or follow instructions. Up with PT today, will need Care management to assist with discharge.  DVT Prophylaxis - Lovenox, Foot Pumps and TED hose Weight-Bearing as tolerated to left leg  J. Horris LatinoLance McGhee, PA-C [facility] 08/14/2015, 7:46 AM

## 2015-08-14 NOTE — Progress Notes (Signed)
PT worked with patient today. Plan is for patient to go to [facility] for rehab. Clinical Social Worker (CSW) will continue to follow and assist as needed.   Jetta LoutBailey Morgan, LCSWA [phone]

## 2015-08-14 NOTE — Care Management Important Message (Signed)
Important Message  Patient Details  Name: Josetta HuddleJosephine E Harvill MRN: [id number] Date of Birth: 09-Feb-1929   Medicare Important Message Given:  Yes    Collie SiadAngela Suleiman Finigan, RN 08/14/2015, 8:33 AM

## 2015-08-14 NOTE — Evaluation (Signed)
Physical Therapy Evaluation Patient Details Name: Dana Riddle MRN: [id number] DOB: 07/20/1929 Today's Date: 08/14/2015   History of Present Illness  Patient is an 81 y.o. female presenting to hospital (from [facility]) with emesis and c/o hip pain.  Pt found to have L hip fx and UTI and transfused 1 unit PRBC's prior to surgery.  Pt s/p L IMN d/t intertrochanteric hip fx 08/13/15.  Of note, pt with recent R hip fx (s/p IMN intertrochanteric 07/10/15 by Dr. Ernest Pine).  PMH includes Alzheimer's dementia.  Clinical Impression  Currently pt demonstrates impairments with strength, balance, pain, and limitations with functional mobility.  Pt is a poor historian and unable to elaborate on PLOF.  Pt lives at [facility].  Currently pt requires 2 assist with bed mobility and transfers bed to recliner.  Pt would benefit from skilled PT to address above noted impairments and functional limitations.  Recommend pt discharge to STR when medically appropriate.     Follow Up Recommendations SNF    Equipment Recommendations   (TBD)    Recommendations for Other Services       Precautions / Restrictions Precautions Precautions: Fall Restrictions Weight Bearing Restrictions: Yes LLE Weight Bearing: Weight bearing as tolerated      Mobility  Bed Mobility Overal bed mobility: Needs Assistance Bed Mobility: Supine to Sit     Supine to sit: Mod assist;+2 for physical assistance;HOB elevated     General bed mobility comments: assist for trunk and B LE's d/t significant L hip pain with movement  Transfers Overall transfer level: Needs assistance Equipment used: Rolling walker (2 wheeled) Transfers: Sit to/from Stand Sit to Stand: Mod assist;+2 physical assistance         General transfer comment: vc's and assist required for hand and feet placement  Ambulation/Gait Ambulation/Gait assistance: Mod assist;+2 physical assistance Ambulation Distance (Feet): 3 Feet (bed to  recliner) Assistive device: Rolling walker (2 wheeled)   Gait velocity: decreased   General Gait Details: antalgic; decreased stance time L LE; pt able to take a few steps but then pivoted on R LE rest of way to chair; pt required vc's and assist for walker movement and for taking steps/sequencing  Stairs            Wheelchair Mobility    Modified Rankin (Stroke Patients Only)       Balance Overall balance assessment: Needs assistance Sitting-balance support: Bilateral upper extremity supported;Feet supported Sitting balance-Leahy Scale: Fair     Standing balance support: Bilateral upper extremity supported (on RW) Standing balance-Leahy Scale: Poor Standing balance comment: posterior lean                             Pertinent Vitals/Pain Pain Assessment: Faces Faces Pain Scale: Hurts whole lot Pain Location: L hip with movement (no pain at rest) Pain Descriptors / Indicators: Sharp;Shooting Pain Intervention(s): Limited activity within patient's tolerance;Monitored during session;Premedicated before session;Repositioned;Ice applied    Home Living Family/patient expects to be discharged to:: Assisted living                 Additional Comments: Pt is poor historian and unable to give home living details (per chart, pt from [facility]).    Prior Function           Comments: Pt reports being independent without AD but appears confused and d/t recent R hip fx s/p surgery, anticipate pt would be using an  AD.     Hand Dominance        Extremity/Trunk Assessment   Upper Extremity Assessment: Generalized weakness           Lower Extremity Assessment: RLE deficits/detail;LLE deficits/detail RLE Deficits / Details: R hip flexion, knee flexion/extension, and DF at least 3+/5 LLE Deficits / Details: L hip flexion 2+/5, knee flexion/extension 2+/5, DF 4/5     Communication   Communication: HOH  Cognition Arousal/Alertness:  Awake/alert Behavior During Therapy: Agitated;Flat affect (pt appearing to get mildly agitated at times) Overall Cognitive Status: No family/caregiver present to determine baseline cognitive functioning (Pt oriented to self only)       Memory: Decreased recall of precautions              General Comments General comments (skin integrity, edema, etc.): L LE dressings in place  Nursing cleared pt for participation in physical therapy.  Pt agreeable to PT session.    Exercises        Assessment/Plan    PT Assessment Patient needs continued PT services  PT Diagnosis Difficulty walking;Acute pain   PT Problem List Decreased strength;Decreased activity tolerance;Decreased balance;Decreased mobility;Decreased knowledge of use of DME;Decreased knowledge of precautions;Pain  PT Treatment Interventions DME instruction;Gait training;Functional mobility training;Therapeutic activities;Therapeutic exercise;Balance training;Neuromuscular re-education;Patient/family education;Manual techniques   PT Goals (Current goals can be found in the Care Plan section) Acute Rehab PT Goals Patient Stated Goal: pt did not state any goals PT Goal Formulation: With patient    Frequency BID   Barriers to discharge Decreased caregiver support      Co-evaluation               End of Session Equipment Utilized During Treatment: Gait belt Activity Tolerance: Patient limited by pain Patient left: in chair;with call bell/phone within reach;with chair alarm set;with SCD's reapplied (B heels elevated via towel rolls) Nurse Communication: Mobility status;Precautions;Weight bearing status         Time: 8657-84691045-1104 PT Time Calculation (min) (ACUTE ONLY): 19 min   Charges:   PT Evaluation $Initial PT Evaluation Tier I: 1 Procedure     PT G CodesHendricks Limes:        Asuna Peth 08/14/2015, 3:57 PM Hendricks LimesEmily Leira Regino, PT [phone]

## 2015-08-14 NOTE — Care Management (Signed)
RNCM consult received and will continue to follow for HHPT. [facility] is from [facility] and they anticipate that [facility] will go to [facility]. CSW will follow.

## 2015-08-15 DIAGNOSIS — E43 Unspecified severe protein-calorie malnutrition: Secondary | ICD-10-CM

## 2015-08-15 LAB — CBC
HEMATOCRIT: 30.5 % — AB (ref 35.0–47.0)
Hemoglobin: 10 g/dL — ABNORMAL LOW (ref 12.0–16.0)
MCH: 29.8 pg (ref 26.0–34.0)
MCHC: 32.6 g/dL (ref 32.0–36.0)
MCV: 91.4 fL (ref 80.0–100.0)
Platelets: 319 10*3/uL (ref 150–440)
RBC: 3.34 MIL/uL — ABNORMAL LOW (ref 3.80–5.20)
RDW: 14.1 % (ref 11.5–14.5)
WBC: 9.7 10*3/uL (ref 3.6–11.0)

## 2015-08-15 LAB — BASIC METABOLIC PANEL
ANION GAP: 5 (ref 5–15)
BUN: 10 mg/dL (ref 6–20)
CO2: 27 mmol/L (ref 22–32)
Calcium: 8 mg/dL — ABNORMAL LOW (ref 8.9–10.3)
Chloride: 101 mmol/L (ref 101–111)
Creatinine, Ser: 0.7 mg/dL (ref 0.44–1.00)
GFR calc Af Amer: >60 mL/min (ref >60)
Glucose, Bld: 129 mg/dL — ABNORMAL HIGH (ref 65–99)
POTASSIUM: 4.3 mmol/L (ref 3.5–5.1)
SODIUM: 133 mmol/L — AB (ref 135–145)

## 2015-08-15 LAB — GLUCOSE, CAPILLARY
GLUCOSE-CAPILLARY: 126 mg/dL — AB (ref 65–99)
GLUCOSE-CAPILLARY: 167 mg/dL — AB (ref 65–99)
Glucose-Capillary: 226 mg/dL — ABNORMAL HIGH (ref 65–99)
Glucose-Capillary: 244 mg/dL — ABNORMAL HIGH (ref 65–99)

## 2015-08-15 LAB — URINE CULTURE: Culture: >=100000

## 2015-08-15 NOTE — Progress Notes (Signed)
   08/15/15 1400  Clinical Encounter Type  Visited With Patient  Visit Type Follow-up  Consult/Referral To Chaplain  Chaplain spoke with patient. She did not seem to remember who I was less than five minutes into the conversation and started the conversation again from the top.   Chaplain Trimaine Maser Ext: [phone]

## 2015-08-15 NOTE — Progress Notes (Signed)
[facility] [place] at [facility]   PATIENT NAME: Dana SmokerJosephine Riddle    MR#:  [id number]  DATE OF BIRTH:  Nov 13, 1928  SUBJECTIVE:  CHIEF COMPLAINT:   Chief Complaint  Patient presents with  . Emesis   patient is a 81 year old female with past history significant for history of  Right hip fracture, hypertension, diabetes.  Alzheimer's dementia who presents to the hospital due to emesis. On arrival to the hospital, she complained of hip pain and was noted to have left hip fracture. Her labs revealed anemia, hyponatremia, pyuria, concerning for urinary tract infection. Patient was not able to provide review of systems due to dementia. Blood pressure was noted to be low in 80s to 90s. Due to anemia and hypotension. Patient was transfused 1 unit of packed red blood cells perioperatively with improvement of hemoglobin level to 10.5 and improvement of blood pressure. Today she feels comfortable. Denies pain. The patient underwent ORIF of her left hip fracture 15th of November 2016 by Dr. Rosita KeaMenz Patient feels satisfactory today. Likely discharged to skilled nursing facility for rehabilitation tomorrow,   Review of Systems  Unable to perform ROS: dementia    VITAL SIGNS: Blood pressure 102/52, pulse 95, temperature 98 F (36.7 C), temperature source Oral, resp. rate 16, height 5\' 3"  (1.6 m), weight 42.185 kg (93 lb), SpO2 98 %.  PHYSICAL EXAMINATION:   GENERAL:  81 y.o.-year-old patient lying in the bed with no acute distress.  EYES: Pupils equal, round, reactive to light and accommodation. No scleral icterus. Extraocular muscles intact.  HEENT: Head atraumatic, normocephalic. Oropharynx and nasopharynx clear.  NECK:  Supple, no jugular venous distention. No thyroid enlargement, no tenderness.  LUNGS: Normal breath sounds bilaterally, no wheezing, rales,rhonchi or crepitation. No use of accessory muscles of respiration.  CARDIOVASCULAR: S1, S2 normal. No murmurs, rubs, or  gallops.  ABDOMEN: Soft, nontender, nondistended. Bowel sounds present. No organomegaly or mass.  EXTREMITIES: No pedal edema, cyanosis, or clubbing.  NEUROLOGIC: Cranial nerves II through XII are intact. Muscle strength 5/5 in all extremities. Sensation intact. Gait not checked.  PSYCHIATRIC: The patient is alert but not oriented , intermittently follows commands. Minimal verbal interaction.  SKIN: No obvious rash, lesion, or ulcer. Left hip area has few incision sites, which are dressed. No bleeding, minimal swelling and discomfort on palpation  ORDERS/RESULTS REVIEWED:   CBC  Recent Labs Lab 08/11/15 1134 08/12/15 0356 08/12/15 1140 08/12/15 2020 08/14/15 0653 08/15/15 0606  WBC 13.7* 9.5  --   --  11.1* 9.7  HGB 10.7* 8.8* 9.1* 10.5* 9.9* 10.0*  HCT 32.3* 26.1* 27.4* 31.0* 29.3* 30.5*  PLT 333 273  --   --  304 319  MCV 91.7 93.1  --   --  91.4 91.4  MCH 30.3 31.6  --   --  30.8 29.8  MCHC 33.0 33.9  --   --  33.7 32.6  RDW 14.5 14.2  --   --  14.5 14.1   ------------------------------------------------------------------------------------------------------------------  Chemistries   Recent Labs Lab 08/11/15 1134 08/11/15 1617 08/12/15 0356 08/13/15 0702 08/14/15 0653 08/15/15 0606  NA 125*  --  130* 130* 131* 133*  K 4.6  --  4.3 3.9 4.0 4.3  CL 91*  --  97* 100* 99* 101  CO2 26  --  27 24 27 27   GLUCOSE 252*  --  219* 124* 211* 129*  BUN 26*  --  20 13 11 10   CREATININE 0.88  --  0.79  0.61 0.71 0.70  CALCIUM 8.5*  --  7.9* 8.0* 7.8* 8.0*  MG  --  1.7  --   --   --   --   AST 11*  --   --   --   --   --   ALT 9*  --   --   --   --   --   ALKPHOS 188*  --   --   --   --   --   BILITOT 1.2  --   --   --   --   --    ------------------------------------------------------------------------------------------------------------------ estimated creatinine clearance is 34.9 mL/min (by C-G formula based on Cr of  0.7). ------------------------------------------------------------------------------------------------------------------  Recent Labs  08/14/15 1524  TSH 3.860    Cardiac Enzymes  Recent Labs Lab 08/11/15 1134  TROPONINI <0.03   ------------------------------------------------------------------------------------------------------------------ Invalid input(s): POCBNP ---------------------------------------------------------------------------------------------------------------  RADIOLOGY: Dg C-arm 61-120 Min  08/13/2015  CLINICAL DATA:  Left intratrochanteric fracture EXAM: LEFT FEMUR 2 VIEWS; DG C-ARM 61-120 MIN COMPARISON:  08/11/2015 FLUOROSCOPY TIME:  Radiation Exposure Index (as provided by the fluoroscopic device): 3.9 mGy If the device does not provide the exposure index: Fluoroscopy Time:  1 minutes 9 seconds Number of Acquired Images:  3 FINDINGS: Proximal medullary rod with compression screw is noted. Fracture fragments are in near anatomic alignment. IMPRESSION: Status post ORIF of proximal left femoral fracture. Electronically Signed   By: Alcide Clever M.D.   On: 08/13/2015 17:06   Dg Femur Min 2 Views Left  08/13/2015  CLINICAL DATA:  Left intratrochanteric fracture EXAM: LEFT FEMUR 2 VIEWS; DG C-ARM 61-120 MIN COMPARISON:  08/11/2015 FLUOROSCOPY TIME:  Radiation Exposure Index (as provided by the fluoroscopic device): 3.9 mGy If the device does not provide the exposure index: Fluoroscopy Time:  1 minutes 9 seconds Number of Acquired Images:  3 FINDINGS: Proximal medullary rod with compression screw is noted. Fracture fragments are in near anatomic alignment. IMPRESSION: Status post ORIF of proximal left femoral fracture. Electronically Signed   By: Alcide Clever M.D.   On: 08/13/2015 17:06    EKG:  Orders placed or performed during the hospital encounter of 08/11/15  . ED EKG  . ED EKG    ASSESSMENT AND PLAN:  Principal Problem:   Closed left hip fracture  (HCC) Active Problems:   UTI (lower urinary tract infection)   Hyponatremia  1. closed left intratrochanteric hip fracture, status post ORIF 08/13/2015 with Dr. Rosita Kea, continue supportive therapy, pain control is adequate , physical therapist recommends rehabilitation facility placement , likely tomorrow 2. Hypotension, unclear etiology, likely due to anemia versus dehydration related , resolved with blood transfusion, discontinue IV fluids until by mouth intake is good 3. Hyponatremia. Minimal improvement with IV fluid administration, initially thought to be dehydration related, but most likely SIADH, TSH  his normal as well as cortisol level,  osmolarities of urine and serum are suggestive of SIADH , initiate fluid restriction to 1400 cc over 24 hours  4. Sterile pyuria , , urine cultures revealed yeast ,  off Rocephin  5. Anemia,  Questionable anemia   of chronic disease. Status post transfusion of 1 unit of packed red blood celpreoperatively, stable with hydration  6. C KD , stage 3, stablefrom before    Management plans discussed with the patient, family and they are in agreement.   DRUG ALLERGIES: No Known Allergies  CODE STATUS:     Code Status Orders  Start     Ordered   08/11/15 1605  Full code   Continuous     08/11/15 1604      TOTAL TIME TAKING CARE OF THIS PATIENT: 35 minutes.    Katharina Caper M.D on 08/15/2015 at 3:52 PM  Between 7am to 6pm - Pager - [phone]  After 6pm go to [email or web address] - password [email or web address] [facility]  [place] [facility]  Office  [phone]  CC: Primary care physician; Vonita Moss, MD

## 2015-08-15 NOTE — Progress Notes (Signed)
   Subjective: 2 Days Post-Op Procedure(s) (LRB): INTRAMEDULLARY (IM) NAIL FEMORAL (Left) Patient is a difficult historian.  We will continue therapy today.  Plan is to go Skilled nursing facility after hospital stay.  Objective: Vital signs in last 24 hours: Temp:  [97.8 F (36.6 C)-98.6 F (37 C)] 98.6 F (37 C) (11/17 0736) Pulse Rate:  [69-89] 87 (11/17 0736) Resp:  [17-18] 17 (11/17 0736) BP: (97-141)/(38-72) 141/72 mmHg (11/17 0736) SpO2:  [94 %-98 %] 98 % (11/17 0736)  Intake/Output from previous day: 11/16 0701 - 11/17 0700 In: 910 [P.O.:240; I.V.:620; IV Piggyback:50] Out: 23 [Urine:22; Emesis/NG output:1] Intake/Output this shift:     Recent Labs  08/12/15 1140 08/12/15 2020 08/14/15 0653 08/15/15 0606  HGB 9.1* 10.5* 9.9* 10.0*    Recent Labs  08/14/15 0653 08/15/15 0606  WBC 11.1* 9.7  RBC 3.21* 3.34*  HCT 29.3* 30.5*  PLT 304 319    Recent Labs  08/14/15 0653 08/15/15 0606  NA 131* 133*  K 4.0 4.3  CL 99* 101  CO2 27 27  BUN 11 10  CREATININE 0.71 0.70  GLUCOSE 211* 129*  CALCIUM 7.8* 8.0*   No results for input(s): LABPT, INR in the last 72 hours.  EXAM General - Patient is Alert and Confused Extremity - Neurovascular intact Sensation intact distally Intact pulses distally Dorsiflexion/Plantar flexion intact Dressing - dressing C/D/I and no drainage Motor Function - intact, moving foot and toes well on exam.   Past Medical History  Diagnosis Date  . Diabetes mellitus without complication (HCC)   . Hyperlipidemia   . Hypertension   . Chronic kidney disease   . Alzheimer's dementia     Assessment/Plan:   2 Days Post-Op Procedure(s) (LRB): INTRAMEDULLARY (IM) NAIL FEMORAL (Left) Principal Problem:   Closed left hip fracture (HCC) Active Problems:   UTI (lower urinary tract infection)   Hyponatremia  Estimated body mass index is 16.48 kg/(m^2) as calculated from the following:   Height as of this encounter: 5\' 3"  (1.6  m).   Weight as of this encounter: 42.185 kg (93 lb). Advance diet Up with therapy  Needs BM Plan on discharge to SNF tomorrow.  DVT Prophylaxis - Lovenox, Foot Pumps and TED hose Weight-Bearing as tolerated to right leg D/C O2 and Pulse OX and try on Room Air  T. Cranston Neighborhris Gaines, PA-C [facility] 08/15/2015, 8:02 AM

## 2015-08-15 NOTE — Progress Notes (Signed)
Physical Therapy Treatment Patient Details Name: Dana Riddle MRN: [id number] DOB: 22-Dec-1928 Today's Date: 08/15/2015    History of Present Illness Patient is an 81 y.o. female presenting to hospital (from [place] [facility]) with emesis and c/o hip pain.  Pt found to have L hip fx and UTI and transfused 1 unit PRBC's prior to surgery.  Pt s/p L IMN d/t intertrochanteric hip fx 08/13/15.  Of note, pt with recent R hip fx (s/p IMN intertrochanteric 07/10/15 by Dr. Ernest Pine).  PMH includes Alzheimer's dementia.    PT Comments    Pt appeared to have less pain in sitting today but still c/o a lot of L hip pain with standing and ambulation using RW.  Pt was able to progress to taking a few more steps today with max cueing and encouragement.  Pt able to participate in LE ex's today.  Will continue to progress pt per pt tolerance.   Follow Up Recommendations  SNF     Equipment Recommendations   (TBD)    Recommendations for Other Services       Precautions / Restrictions Precautions Precautions: Fall Restrictions Weight Bearing Restrictions: No RLE Weight Bearing: Weight bearing as tolerated LLE Weight Bearing: Weight bearing as tolerated    Mobility  Bed Mobility Overal bed mobility: Needs Assistance Bed Mobility: Supine to Sit     Supine to sit: Max assist;HOB elevated     General bed mobility comments: assist for trunk and B LE's  Transfers Overall transfer level: Needs assistance Equipment used: Rolling walker (2 wheeled) Transfers: Sit to/from Stand Sit to Stand: Mod assist;+2 safety/equipment         General transfer comment: vc's and assist required for hand and feet placement  Ambulation/Gait Ambulation/Gait assistance: Mod assist;+2 physical assistance Ambulation Distance (Feet): 4 Feet Assistive device: Rolling walker (2 wheeled)   Gait velocity: decreased   General Gait Details: antalgic; decreased stance time L LE; pt able to take a few steps with max  vc's and encouragement; pt required vc's and assist for walker movement and for taking steps/sequencing   Stairs            Wheelchair Mobility    Modified Rankin (Stroke Patients Only)       Balance Overall balance assessment: Needs assistance Sitting-balance support: Bilateral upper extremity supported;Feet supported Sitting balance-Leahy Scale: Good     Standing balance support: Bilateral upper extremity supported (on RW) Standing balance-Leahy Scale: Fair                      Cognition Arousal/Alertness: Awake/alert Behavior During Therapy: Agitated;Flat affect (pt appears to get mildly agitated at times) Overall Cognitive Status: No family/caregiver present to determine baseline cognitive functioning (Oriented to self only)       Memory: Decreased recall of precautions              Exercises   Performed semi-supine B LE therapeutic exercise x 10 reps:  Ankle pumps (AROM B LE's); SAQ's (AAROM R; AAROM L); heelslides (AAROM R; AAROM L), hip abd/adduction (AAROM R; AAROM L).  Pt required vc's and tactile cues for correct technique and participation with exercises.     General Comments General comments (skin integrity, edema, etc.): L LE dressings in place  Nursing cleared pt for participation in physical therapy.  Pt agreeable to PT session.      Pertinent Vitals/Pain Pain Assessment: Faces Faces Pain Scale: Hurts even more Pain Location: L hip with activity (pt  reports no pain at rest) Pain Descriptors / Indicators: Sore;Sharp;Shooting Pain Intervention(s): Limited activity within patient's tolerance;Monitored during session;Premedicated before session;Repositioned    Home Living                      Prior Function            PT Goals (current goals can now be found in the care plan section) Acute Rehab PT Goals Patient Stated Goal: pt did not state any goals PT Goal Formulation: With patient Progress towards PT goals: Progressing  toward goals    Frequency  BID    PT Plan Current plan remains appropriate    Co-evaluation             End of Session Equipment Utilized During Treatment: Gait belt Activity Tolerance: Patient limited by pain Patient left: in chair;with call bell/phone within reach;with chair alarm set;with SCD's reapplied (B heels elevated via pillow under lower legs)     Time: [id number] PT Time Calculation (min) (ACUTE ONLY): 26 min  Charges:  $Therapeutic Exercise: 8-22 mins $Therapeutic Activity: 8-22 mins                    G CodesHendricks Limes:      Rhianon Zabawa 08/15/2015, 11:28 AM Hendricks LimesEmily Demir [place], PT [phone]

## 2015-08-15 NOTE — Progress Notes (Signed)
Plan is for patient to D/C to [facility] tomorrow. [facility] liaision is aware of above.   Jetta LoutBailey Morgan, LCSWA [phone]

## 2015-08-15 NOTE — Progress Notes (Signed)
Nutrition Follow-up  DOCUMENTATION CODES:   Severe malnutrition in context of chronic illness  INTERVENTION:   Meals and Snacks: Cater to patient preferences; will recommend liberalizing diet order to dysphagia III to optimize nutritional intake Medical Food Supplement Therapy: Continue Ensure as ordered, will also add Magic Cup TID   NUTRITION DIAGNOSIS:   Inadequate oral intake related to poor appetite as evidenced by percent weight loss  GOAL:   Patient will meet greater than or equal to 90% of their needs; ongoing  MONITOR:    (Energy Intake, Anthropometrics, Digestive System)  REASON FOR ASSESSMENT:   Malnutrition Screening Tool    ASSESSMENT:    Pt s/p surgical intervention of hip fracture POD2. Per RN Caryl AspKellie pt continued to have emesis yesterday, none so far today.   Diet Order:  DIET DYS 3 Room service appropriate?: Yes; Fluid consistency:: Thin    Current Nutrition: On visit pt had eaten 2 bites of chicken salad sandwich at lunch today. Per Caryl AspKellie RN pt ate bites of breakfast and 50% of Ensure. RN also cared for pt yesterday and reports pt ate sherbet and a little Ensure but also vomitted most of it all back up. Poor po intake since admission.   Gastrointestinal Profile: Last BM: 08/13/2015   Scheduled Medications:  . docusate sodium  100 mg Oral BID  . enoxaparin (LOVENOX) injection  40 mg Subcutaneous Q24H  . feeding supplement (ENSURE ENLIVE)  237 mL Oral BID BM  . ferrous sulfate  325 mg Oral BID WC  . insulin aspart  0-5 Units Subcutaneous QHS  . insulin aspart  0-9 Units Subcutaneous TID WC  . insulin detemir  10 Units Subcutaneous BH-q7a  . mirtazapine  15 mg Oral QHS  . nystatin cream   Topical BID  . tamsulosin  0.4 mg Oral Daily  . Vortioxetine HBr  10 mg Oral QHS    Continuous Medications:  . dextrose 5 % and 0.45% NaCl 125 mL/hr (08/13/15 1554)     Electrolyte/Renal Profile and Glucose Profile:   Recent Labs Lab 08/11/15 1617   08/13/15 0702 08/14/15 0653 08/15/15 0606  NA  --   < > 130* 131* 133*  K  --   < > 3.9 4.0 4.3  CL  --   < > 100* 99* 101  CO2  --   < > 24 27 27   BUN  --   < > 13 11 10   CREATININE  --   < > 0.61 0.71 0.70  CALCIUM  --   < > 8.0* 7.8* 8.0*  MG 1.7  --   --   --   --   GLUCOSE  --   < > 124* 211* 129*  < > = values in this interval not displayed. Protein Profile:  Recent Labs Lab 08/11/15 1134  ALBUMIN 2.9*     Nutrition-Focused Physical Exam Findings: Nutrition-Focused physical exam completed. Findings are moderate-severe fat depletion, severe muscle depletion, and no edema.    Weight Trend since Admission: Filed Weights   08/11/15 1620 08/13/15 1500  Weight: 93 lb (42.185 kg) 93 lb (42.185 kg)    Weight Trend: pt with 11.4% weight loss in one month documented.    BMI:  Body mass index is 16.48 kg/(m^2).  Estimated Nutritional Needs:   Kcal:  1191-47821204-1313 kcals (BEE 842, 1.3 AF, 1.1-1.2 IF)   Protein:  46-55 g (1.1-1.3 g/kg)   Fluid:  1260-1470 mL (30-35 ml/kg)     HIGH Care Level  Dwyane Luo, [place], LDN Pager [phone]

## 2015-08-15 NOTE — Progress Notes (Signed)
Physical Therapy Treatment Patient Details Name: Dana Riddle MRN: [id number] DOB: Mar 25, 1929 Today's Date: 08/15/2015    History of Present Illness Patient is an 81 y.o. female presenting to hospital (from [place] [facility]) with emesis and c/o hip pain.  Pt found to have L hip fx and UTI and transfused 1 unit PRBC's prior to surgery.  Pt s/p L IMN d/t intertrochanteric hip fx 08/13/15.  Of note, pt with recent R hip fx (s/p IMN intertrochanteric 07/10/15 by Dr. Ernest Riddle).  PMH includes Alzheimer's dementia.    PT Comments    Pt able to progress to ambulating 3 feet and then 10 feet with RW and 2 assist (with chair follow) this afternoon with encouragement.  Pt agreeable to participating in PT but limited d/t c/o L hip pain with activity.  Will continue to progress pt per pt tolerance.  Continue to recommend pt discharge to [facility].   Follow Up Recommendations  SNF     Equipment Recommendations   (TBD)    Recommendations for Other Services       Precautions / Restrictions Precautions Precautions: Fall Restrictions Weight Bearing Restrictions: No RLE Weight Bearing: Weight bearing as tolerated LLE Weight Bearing: Weight bearing as tolerated    Mobility  Bed Mobility             Transfers Overall transfer level: Needs assistance Equipment used: Rolling walker (2 wheeled) Transfers: Sit to/from Stand Sit to Stand: Min assist;Mod assist;+2 physical assistance         General transfer comment: vc's and assist required for hand and feet placement  Ambulation/Gait Ambulation/Gait assistance: Mod assist;+2 physical assistance Ambulation Distance (Feet):  (3 feet; 10 feet) Assistive device: Rolling walker (2 wheeled)   Gait velocity: decreased   General Gait Details: antalgic; decreased stance time L LE; pt able to take steps with max vc's and encouragement; pt required vc's and assist for walker movement and for taking steps/sequencing   Stairs             Wheelchair Mobility    Modified Rankin (Stroke Patients Only)       Balance Overall balance assessment: Needs assistance Sitting-balance support: Bilateral upper extremity supported;Feet supported Sitting balance-Leahy Scale: Good     Standing balance support: Bilateral upper extremity supported (on RW) Standing balance-Leahy Scale: Fair Standing balance comment: posterior lean                    Cognition Arousal/Alertness: Awake/alert Behavior During Therapy: Agitated;Flat affect (pt appears to get mildly agitated at times but redirectable) Overall Cognitive Status: No family/caregiver present to determine baseline cognitive functioning (Oriented to self only)       Memory: Decreased recall of precautions              Exercises   Performed sitting exercises x 10 reps B LE's:  Heel/toe raises (AAROM R; AAROM L); LAQ's (AROM R; AAROM L); marching/hip flexion (AROM R; AAROM L).  Pt required vc's and tactile cues for correct technique with exercises.     General Comments General comments (skin integrity, edema, etc.): L LE dressings in place  Nursing cleared pt for participation in physical therapy.  Pt agreeable to PT session.      Pertinent Vitals/Pain Pain Assessment: Faces Faces Pain Scale: Hurts even more Pain Location: L hip with activity (pt reports no pain at rest) Pain Descriptors / Indicators: Sore;Sharp;Shooting Pain Intervention(s): Limited activity within patient's tolerance;Monitored during session;Repositioned    Home Living  Prior Function            PT Goals (current goals can now be found in the care plan section) Acute Rehab PT Goals Patient Stated Goal: pt did not state any goals PT Goal Formulation: With patient Progress towards PT goals: Progressing toward goals    Frequency  BID    PT Plan Current plan remains appropriate    Co-evaluation             End of Session Equipment Utilized  During Treatment: Gait belt Activity Tolerance: Patient limited by pain Patient left: in chair (pt found in hallway (sitting in recliner) by nurses station and left in same place end of session (nursing aware of pt's location))     Time: 1410-1433 PT Time Calculation (min) (ACUTE ONLY): 23 min  Charges:  $Gait Training: 8-22 mins $Therapeutic Exercise: 8-22 mins                    G CodesHendricks Riddle:      Dana Riddle 08/15/2015, 2:48 PM Dana LimesEmily Keiden Riddle, PT [phone]

## 2015-08-16 DIAGNOSIS — N182 Chronic kidney disease, stage 2 (mild): Secondary | ICD-10-CM

## 2015-08-16 DIAGNOSIS — I959 Hypotension, unspecified: Secondary | ICD-10-CM

## 2015-08-16 DIAGNOSIS — E785 Hyperlipidemia, unspecified: Secondary | ICD-10-CM

## 2015-08-16 DIAGNOSIS — D649 Anemia, unspecified: Secondary | ICD-10-CM

## 2015-08-16 LAB — GLUCOSE, CAPILLARY: Glucose-Capillary: 177 mg/dL — ABNORMAL HIGH (ref 65–99)

## 2015-08-16 LAB — SODIUM: SODIUM: 134 mmol/L — AB (ref 135–145)

## 2015-08-16 MED ORDER — CLONAZEPAM 0.5 MG PO TABS: 0.2500 mg | ORAL_TABLET | Freq: Every day | ORAL | Status: DC | PRN

## 2015-08-16 MED ORDER — ENSURE ENLIVE PO LIQD: 237.0000 mL | Freq: Two times a day (BID) | ORAL | Status: AC

## 2015-08-16 MED ORDER — NYSTATIN 100000 UNIT/GM EX CREA: TOPICAL_CREAM | Freq: Two times a day (BID) | CUTANEOUS | Status: DC

## 2015-08-16 MED ORDER — TRAMADOL HCL 50 MG PO TABS: 50.0000 mg | ORAL_TABLET | ORAL | Status: DC | PRN

## 2015-08-16 MED ORDER — ENOXAPARIN SODIUM 40 MG/0.4ML ~~LOC~~ SOLN: 40.0000 mg | SUBCUTANEOUS | Status: DC

## 2015-08-16 MED ORDER — OXYCODONE HCL 5 MG PO TABS: 5.0000 mg | ORAL_TABLET | ORAL | Status: DC | PRN

## 2015-08-16 NOTE — Progress Notes (Signed)
[facility] is [facility]. Report called, ems called. Waiting on transportation.

## 2015-08-16 NOTE — Progress Notes (Signed)
Patient is medically stable for D/C to [facility] today. Per [facility] liaison patient is going to room 167-A. RN will call report to Yaakov Guthrie at [phone] and arrange EMS for transport. Clinical Education officer, museum (CSW) sent D/C Summary, D/C packet and FL2 to [facility] via [facility]. EMS form and prescriptions are on chart. CSW contacted patient's DSS Guardian Zigmund Daniel and made her aware of above. CSW faxed Zigmund Daniel D/C Summary. CSW met with patient and made her aware of above. Please reconsult if future social work needs arise. CSW signing off.  Blima Rich, [place] [phone]

## 2015-08-16 NOTE — Progress Notes (Signed)
   Subjective: 3 Days Post-Op Procedure(s) (LRB): INTRAMEDULLARY (IM) NAIL FEMORAL (Left) Patient is a difficult historian.  We will continue therapy today.  Plan is to go Skilled nursing facility today  Objective: Vital signs in last 24 hours: Temp:  [98 F (36.7 C)-98.4 F (36.9 C)] 98.1 F (36.7 C) (11/18 0750) Pulse Rate:  [88-95] 90 (11/18 0750) Resp:  [16-20] 18 (11/18 0750) BP: (102-125)/(52-64) 125/64 mmHg (11/18 0750) SpO2:  [96 %-99 %] 96 % (11/18 0750)  Intake/Output from previous day: 11/17 0701 - 11/18 0700 In: 120 [P.O.:120] Out: -  Intake/Output this shift:     Recent Labs  08/14/15 0653 08/15/15 0606  HGB 9.9* 10.0*    Recent Labs  08/14/15 0653 08/15/15 0606  WBC 11.1* 9.7  RBC 3.21* 3.34*  HCT 29.3* 30.5*  PLT 304 319    Recent Labs  08/14/15 0653 08/15/15 0606 08/16/15 0519  NA 131* 133* 134*  K 4.0 4.3  --   CL 99* 101  --   CO2 27 27  --   BUN 11 10  --   CREATININE 0.71 0.70  --   GLUCOSE 211* 129*  --   CALCIUM 7.8* 8.0*  --    No results for input(s): LABPT, INR in the last 72 hours.  EXAM General - Patient is Alert and Confused Extremity - Neurovascular intact Sensation intact distally Intact pulses distally Dorsiflexion/Plantar flexion intact Dressing - dressing C/D/I and no drainage Motor Function - intact, moving foot and toes well on exam.   Past Medical History  Diagnosis Date  . Diabetes mellitus without complication (HCC)   . Hyperlipidemia   . Hypertension   . Chronic kidney disease   . Alzheimer's dementia     Assessment/Plan:   3 Days Post-Op Procedure(s) (LRB): INTRAMEDULLARY (IM) NAIL FEMORAL (Left) Principal Problem:   Closed left hip fracture (HCC) Active Problems:   UTI (lower urinary tract infection)   Hyponatremia   Protein-calorie malnutrition, severe  Estimated body mass index is 16.48 kg/(m^2) as calculated from the following:   Height as of this encounter: 5\' 3"  (1.6 m).   Weight as  of this encounter: 42.185 kg (93 lb). Advance diet Up with therapy  Needs BM Plan on discharge to [facility] today Follow up with [facility] in 6 weeks Please remove staples and apply steri strips on 08/27/15  DVT Prophylaxis - Lovenox, Foot Pumps and TED hose Weight-Bearing as tolerated to right leg D/C O2 and Pulse OX and try on Room Air  T. Cranston Neighborhris Jennilyn Esteve, PA-C [facility] 08/16/2015, 7:59 AM

## 2015-08-16 NOTE — Discharge Instructions (Signed)
Diet: As you were doing prior to hospitalization   Shower:  May shower but keep the wounds dry, use an occlusive plastic wrap, NO SOAKING IN TUB.  If the bandage gets wet, change with a clean dry gauze.  Dressing:  You may change your dressing as needed. Change the dressing with sterile gauze dressing.    Activity:  Increase activity slowly as tolerated, but follow the weight bearing instructions below.  No lifting or driving for 6 weeks.  Weight Bearing:   Weight bearing as tolerated to left lower extremity  To prevent constipation: you may use a stool softener such as -  Colace (over the counter) 100 mg by mouth twice a day  Drink plenty of fluids (prune juice may be helpful) and high fiber foods Miralax (over the counter) for constipation as needed.    Itching:  If you experience itching with your medications, try taking only a single pain pill, or even half a pain pill at a time.  You may take up to 10 pain pills per day, and you can also use benadryl over the counter for itching or also to help with sleep.   Precautions:  If you experience chest pain or shortness of breath - call 911 immediately for transfer to the hospital emergency department!!  If you develop a fever greater that 101 F, purulent drainage from wound, increased redness or drainage from wound, or calf pain-Call [facility]                                              Follow- Up Appointment:  Please call for an appointment to be [facility] in 6 weeks [facility]

## 2015-08-16 NOTE — Discharge Summary (Signed)
[facility] - [place] [facility]   PATIENT NAME: Dana Riddle    MR#:  [id number]  DATE OF BIRTH:  02-27-1929  DATE OF ADMISSION:  08/11/2015 ADMITTING PHYSICIAN: Shaune Pollack, MD  DATE OF DISCHARGE: No discharge date for patient encounter.  PRIMARY CARE PHYSICIAN: Vonita Moss, MD     ADMISSION DIAGNOSIS:  Hyponatremia [E87.1] Acute lower urinary tract infection [N39.0] Acute vomiting [R11.10] Closed left hip fracture, initial encounter (HCC) [S72.002A]  DISCHARGE DIAGNOSIS:  Principal Problem:   Closed left hip fracture (HCC) Active Problems:   Anemia   Hyponatremia   Protein-calorie malnutrition, severe   Hypotension   CKD (chronic kidney disease), stage II   Sterile pyuria   Hyperlipidemia   SECONDARY DIAGNOSIS:   Past Medical History  Diagnosis Date  . Diabetes mellitus without complication (HCC)   . Hyperlipidemia   . Hypertension   . Chronic kidney disease   . Alzheimer's dementia     .pro HOSPITAL COURSE:   The patient is a 81 year old Caucasian female with history of diabetes,  insulin-dependent, hypertension, hyperlipidemia, CK D,  Alzheimer's dementia who presents to the hospital with nausea, vomiting. In emergency room, she was complained of  left hip pain  and was noted to have left hip fracture. Her urinalysis was also concerning for pyuria, urinary tract infection, so she was initiated on Rocephin while urine cultures were taken. She was also anemic with hemoglobin level 8.8, she was noted to be hypotensive and required a packed red blood cell transfusion after which her hemoglobin level improved and she was ready to undergo surgery. Patient was seen by orthopedist surgeon, Dr. Rosita Kea and she underwent intramedullary nail placement in the left femoral bone under spinal anesthesia for left intertrochanteric hip fracture 15th of November 2016. Postprocedure she did well, she was initiated with physical therapy and recommended  rehabilitation placement. Her pain control was adequate. She was recommended Lovenox for 14 days upon discharge for  DVT prophylaxis. Discussion by problem 1. closed left intratrochanteric hip fracture, status post ORIF 08/13/2015 with Dr. Rosita Kea, continue supportive therapy, pain control is adequate , discharge, patient to a rehabilitation facility today. She is to continue Lovenox for 14 days 2. Hypotension, unclear etiology, likely due to anemia versus dehydration related , resolved with blood transfusion, IV fluids  3. Hyponatremia. Minimal improvement with IV fluid administration, initially thought to be dehydration related, but most likely SIADH, TSH was normal as well as cortisol level, osmolarities of urine and serum where suggestive of SIADH , would benefit from fluid restriction . Overall 4. Sterile pyuria , , urine cultures revealed yeast , off Rocephin  5. Anemia, Questionable anemia of chronic disease. Status post transfusion of 1 unit of packed red blood preoperatively, stable with hydration  6. C KD , stage 2-3, stable from before , follow-up as outpatient DISCHARGE CONDITIONS:   Stable  CONSULTS OBTAINED:  Treatment Team:  Donato Heinz, MD  DRUG ALLERGIES:  No Known Allergies  DISCHARGE MEDICATIONS:   Current Discharge Medication List    START taking these medications   Details  feeding supplement, ENSURE ENLIVE, (ENSURE ENLIVE) LIQD Take 237 mLs by mouth 2 (two) times daily between meals. Qty: 237 mL, Refills: 12    nystatin cream (MYCOSTATIN) Apply topically 2 (two) times daily. Qty: 30 g, Refills: 0      CONTINUE these medications which have CHANGED   Details  clonazePAM (KLONOPIN) 0.5 MG tablet Take 0.5 tablets (0.25 mg total) by mouth  daily as needed (for agitation). Qty: 15 tablet, Refills: 0    enoxaparin (LOVENOX) 40 MG/0.4ML injection Inject 0.4 mLs (40 mg total) into the skin daily. Qty: 14 Syringe, Refills: 0    oxyCODONE (OXY IR/ROXICODONE)  5 MG immediate release tablet Take 1-2 tablets (5-10 mg total) by mouth every 4 (four) hours as needed for breakthrough pain ((for MODERATE breakthrough pain)). Qty: 30 tablet, Refills: 0    traMADol (ULTRAM) 50 MG tablet Take 1-2 tablets (50-100 mg total) by mouth every 4 (four) hours as needed for moderate pain. Qty: 30 tablet, Refills: 0      CONTINUE these medications which have NOT CHANGED   Details  acetaminophen (TYLENOL) 500 MG tablet Take 500 mg by mouth every 6 (six) hours as needed for fever or headache.    aspirin EC 81 MG tablet Take 81 mg by mouth daily.    ferrous sulfate 325 (65 FE) MG tablet Take 1 tablet (325 mg total) by mouth 2 (two) times daily with a meal. Qty: 30 tablet, Refills: 0    Insulin Detemir (LEVEMIR FLEXTOUCH) 100 UNIT/ML Pen Inject 10 Units into the skin every morning.    mirtazapine (REMERON) 15 MG tablet Take 15 mg by mouth at bedtime.     Vortioxetine HBr (TRINTELLIX) 10 MG TABS Take 10 mg by mouth at bedtime.     white petrolatum (VASELINE) GEL Apply 1 application topically as needed for dry skin. Pt applies to legs.    tamsulosin (FLOMAX) 0.4 MG CAPS capsule Take 1 capsule (0.4 mg total) by mouth daily. Qty: 30 capsule, Refills: 2         DISCHARGE INSTRUCTIONS:    Patient is to follow-up with her primary care physician and orthopedist surgeon as outpatient  If you experience worsening of your admission symptoms, develop shortness of breath, life threatening emergency, suicidal or homicidal thoughts you must seek medical attention immediately by calling 911 or calling your MD immediately  if symptoms less severe.  You Must read complete instructions/literature along with all the possible adverse reactions/side effects for all the Medicines you take and that have been prescribed to you. Take any new Medicines after you have completely understood and accept all the possible adverse reactions/side effects.   Please note  You were cared for  by a hospitalist during your hospital stay. If you have any questions about your discharge medications or the care you received while you were in the hospital after you are discharged, you can call the unit and asked to speak with the hospitalist on call if the hospitalist that took care of you is not available. Once you are discharged, your primary care physician will handle any further medical issues. Please note that NO REFILLS for any discharge medications will be authorized once you are discharged, as it is imperative that you return to your primary care physician (or establish a relationship with a primary care physician if you do not have one) for your aftercare needs so that they can reassess your need for medications and monitor your lab values.    Today   CHIEF COMPLAINT:   Chief Complaint  Patient presents with  . Emesis    HISTORY OF PRESENT ILLNESS:  Dana Riddle  is a 81 y.o. female with a known history of diabetes, hypertension, hyperlipidemia. C KD Alzheimer's dementia who presents to the hospital with nausea, vomiting. In emergency room, she was complained of  left hip pain  and was noted to have left hip  fracture. Her urinalysis was also concerning for pyuria, urinary tract infection, so she was initiated on Rocephin while urine cultures were taken. She was also anemic with hemoglobin level 8.8, she was noted to be hypotensive and required a packed red blood cell transfusion after which her hemoglobin level improved and she was ready to undergo surgery. Patient was seen by orthopedist surgeon, Dr. Rosita Kea and she underwent intramedullary nail placement in the left femoral bone under spinal anesthesia for left intertrochanteric hip fracture 15th of November 2016. Postprocedure she did well, she was initiated with physical therapy and recommended rehabilitation placement. Her pain control was adequate. She was recommended Lovenox for 14 days upon discharge for  DVT  prophylaxis. Discussion by problem 1. closed left intratrochanteric hip fracture, status post ORIF 08/13/2015 with Dr. Rosita Kea, continue supportive therapy, pain control is adequate , discharge, patient to a rehabilitation facility today. She is to continue Lovenox for 14 days 2. Hypotension, unclear etiology, likely due to anemia versus dehydration related , resolved with blood transfusion, IV fluids  3. Hyponatremia. Minimal improvement with IV fluid administration, initially thought to be dehydration related, but most likely SIADH, TSH was normal as well as cortisol level, osmolarities of urine and serum where suggestive of SIADH , would benefit from fluid restriction . Overall 4. Sterile pyuria , , urine cultures revealed yeast , off Rocephin  5. Anemia, Questionable anemia of chronic disease. Status post transfusion of 1 unit of packed red blood preoperatively, stable with hydration  6. C KD , stage 2-3, stable from before , follow-up as outpatient   VITAL SIGNS:  Blood pressure 125/64, pulse 90, temperature 98.1 F (36.7 C), temperature source Oral, resp. rate 18, height  (1.6 m), weight 42.185 kg (93 lb), SpO2 96 %.  I/O:   Intake/Output Summary (Last 24 hours) at 08/16/15 0813 Last data filed at 08/16/15 0717  Gross per 24 hour  Intake    120 ml  Output      0 ml  Net    120 ml    PHYSICAL EXAMINATION:  GENERAL:  81 y.o.-year-old patient lying in the bed with no acute distress.  EYES: Pupils equal, round, reactive to light and accommodation. No scleral icterus. Extraocular muscles intact.  HEENT: Head atraumatic, normocephalic. Oropharynx and nasopharynx clear.  NECK:  Supple, no jugular venous distention. No thyroid enlargement, no tenderness.  LUNGS: Normal breath sounds bilaterally, no wheezing, rales,rhonchi or crepitation. No use of accessory muscles of respiration.  CARDIOVASCULAR: S1, S2 normal. No murmurs, rubs, or gallops.  ABDOMEN: Soft, non-tender,  non-distended. Bowel sounds present. No organomegaly or mass.  EXTREMITIES: No pedal edema, cyanosis, or clubbing.  NEUROLOGIC: Cranial nerves II through XII are intact. Muscle strength 5/5 in all extremities. Sensation intact. Gait not checked.  PSYCHIATRIC: The patient is alert and oriented x 3.  SKIN: No obvious rash, lesion, or ulcer.   DATA REVIEW:   CBC  Recent Labs Lab 08/15/15 0606  WBC 9.7  HGB 10.0*  HCT 30.5*  PLT 319    Chemistries   Recent Labs Lab 08/11/15 1134 08/11/15 1617  08/15/15 0606 08/16/15 0519  NA 125*  --   < > 133* 134*  K 4.6  --   < > 4.3  --   CL 91*  --   < > 101  --   CO2 26  --   < > 27  --   GLUCOSE 252*  --   < > 129*  --  BUN 26*  --   < > 10  --   CREATININE 0.88  --   < > 0.70  --   CALCIUM 8.5*  --   < > 8.0*  --   MG  --  1.7  --   --   --   AST 11*  --   --   --   --   ALT 9*  --   --   --   --   ALKPHOS 188*  --   --   --   --   BILITOT 1.2  --   --   --   --   < > = values in this interval not displayed.  Cardiac Enzymes  Recent Labs Lab 08/11/15 1134  TROPONINI <0.03    Microbiology Results  Results for orders placed or performed during the hospital encounter of 08/11/15  Urine culture     Status: None   Collection Time: 08/11/15 12:52 PM  Result Value Ref Range Status   Specimen Description URINE, CATHETERIZED  Final   Special Requests NONE  Final   Culture >=100,000 COLONIES/mL CANDIDA GLABRATA  Final   Report Status 08/15/2015 FINAL  Final  Surgical pcr screen     Status: None   Collection Time: 08/11/15  5:55 PM  Result Value Ref Range Status   MRSA, PCR NEGATIVE NEGATIVE Final   Staphylococcus aureus NEGATIVE NEGATIVE Final    Comment:        The Xpert SA Assay (FDA approved for NASAL specimens in patients over 9 years of age), is one component of a comprehensive surveillance program.  Test performance has been validated by [facility] for patients greater than or equal to 40 year old. It is not  intended to diagnose infection nor to guide or monitor treatment.     RADIOLOGY:  No results found.  EKG:   Orders placed or performed during the hospital encounter of 08/11/15  . ED EKG  . ED EKG      Management plans discussed with the patient, family and they are in agreement.  CODE STATUS:     Code Status Orders        Start     Ordered   08/13/15 1837  Full code   Continuous     08/13/15 1836      TOTAL TIME TAKING CARE OF THIS PATIENT: 40 minutes.    Katharina Caper M.D on 08/16/2015 at 8:13 AM  Between 7am to 6pm - Pager - [phone]  After 6pm go to [email or web address] - password [email or web address] [facility]  [facility]  Office  [phone]  CC: Primary care physician; Vonita Moss, MD

## 2015-08-16 NOTE — Clinical Social Work Placement (Signed)
   CLINICAL SOCIAL WORK PLACEMENT  NOTE  Date:  08/16/2015  Patient Details  Name: Dana HuddleJosephine E Worlds MRN: [id number] Date of Birth: [birth year]  Clinical Social Work is seeking post-discharge placement for this patient at the Skilled  Nursing Facility level of care (*CSW will initial, date and re-position this form in  chart as items are completed):  Yes   Patient/family provided with [facility] Clinical Social Work Department's list of facilities offering this level of care within the geographic area requested by the patient (or if unable, by the patient's family).  Yes   Patient/family informed of their freedom to choose among providers that offer the needed level of care, that participate in Medicare, Medicaid or managed care program needed by the patient, have an available bed and are willing to accept the patient.  Yes   Patient/family informed of [facility]'s ownership interest in [facility] and [facility], as well as of the fact that they are under no obligation to receive care at these facilities.  PASRR submitted to EDS on       PASRR number received on       Existing PASRR number confirmed on 08/12/15     FL2 transmitted to all facilities in geographic area requested by pt/family on 08/12/15     FL2 transmitted to all facilities within larger geographic area on       Patient informed that his/her managed care company has contracts with or will negotiate with certain facilities, including the following:        Yes   Patient/family informed of bed offers received.  [facility] at  [facility]     Physician recommends and patient chooses bed at      Patient to be transferred to  ([facility] ) on 08/16/15.  Patient to be transferred to facility by  [facility]     Patient family notified on 08/16/15 of transfer.  Name of family member notified:   Joyce Gross([place] [employer] Guardian aware of D/C today. )     PHYSICIAN       Additional Comment:     _______________________________________________ Haig ProphetMorgan, Kamal Jurgens G, LCSW 08/16/2015, 9:33 AM

## 2016-06-26 ENCOUNTER — Emergency Department: Payer: Medicare Other

## 2016-06-26 ENCOUNTER — Encounter: Payer: Self-pay | Admitting: Emergency Medicine

## 2016-06-26 ENCOUNTER — Inpatient Hospital Stay
Admission: EM | Admit: 2016-06-26 | Discharge: 2016-07-01 | DRG: 682 | Disposition: A | Discharge status: Hospice | Payer: Medicare Other | Attending: Internal Medicine | Admitting: Internal Medicine

## 2016-06-26 DIAGNOSIS — E871 Hypo-osmolality and hyponatremia: Secondary | ICD-10-CM | POA: Diagnosis present

## 2016-06-26 DIAGNOSIS — E785 Hyperlipidemia, unspecified: Secondary | ICD-10-CM | POA: Diagnosis present

## 2016-06-26 DIAGNOSIS — R4182 Altered mental status, unspecified: Secondary | ICD-10-CM

## 2016-06-26 DIAGNOSIS — Z993 Dependence on wheelchair: Secondary | ICD-10-CM

## 2016-06-26 DIAGNOSIS — F0280 Dementia in other diseases classified elsewhere without behavioral disturbance: Secondary | ICD-10-CM | POA: Diagnosis present

## 2016-06-26 DIAGNOSIS — Z7982 Long term (current) use of aspirin: Secondary | ICD-10-CM | POA: Diagnosis not present

## 2016-06-26 DIAGNOSIS — F05 Delirium due to known physiological condition: Secondary | ICD-10-CM | POA: Diagnosis present

## 2016-06-26 DIAGNOSIS — B952 Enterococcus as the cause of diseases classified elsewhere: Secondary | ICD-10-CM | POA: Diagnosis present

## 2016-06-26 DIAGNOSIS — Z515 Encounter for palliative care: Secondary | ICD-10-CM

## 2016-06-26 DIAGNOSIS — E875 Hyperkalemia: Secondary | ICD-10-CM | POA: Diagnosis present

## 2016-06-26 DIAGNOSIS — Z7189 Other specified counseling: Secondary | ICD-10-CM

## 2016-06-26 DIAGNOSIS — Z66 Do not resuscitate: Secondary | ICD-10-CM | POA: Diagnosis present

## 2016-06-26 DIAGNOSIS — E1122 Type 2 diabetes mellitus with diabetic chronic kidney disease: Secondary | ICD-10-CM | POA: Diagnosis present

## 2016-06-26 DIAGNOSIS — I129 Hypertensive chronic kidney disease with stage 1 through stage 4 chronic kidney disease, or unspecified chronic kidney disease: Secondary | ICD-10-CM | POA: Diagnosis present

## 2016-06-26 DIAGNOSIS — G309 Alzheimer's disease, unspecified: Secondary | ICD-10-CM | POA: Diagnosis present

## 2016-06-26 DIAGNOSIS — N136 Pyonephrosis: Secondary | ICD-10-CM | POA: Diagnosis present

## 2016-06-26 DIAGNOSIS — Z87440 Personal history of urinary (tract) infections: Secondary | ICD-10-CM

## 2016-06-26 DIAGNOSIS — N179 Acute kidney failure, unspecified: Secondary | ICD-10-CM

## 2016-06-26 DIAGNOSIS — J45909 Unspecified asthma, uncomplicated: Secondary | ICD-10-CM | POA: Diagnosis present

## 2016-06-26 DIAGNOSIS — I248 Other forms of acute ischemic heart disease: Secondary | ICD-10-CM | POA: Diagnosis present

## 2016-06-26 DIAGNOSIS — N1330 Unspecified hydronephrosis: Secondary | ICD-10-CM

## 2016-06-26 DIAGNOSIS — F17200 Nicotine dependence, unspecified, uncomplicated: Secondary | ICD-10-CM | POA: Diagnosis present

## 2016-06-26 DIAGNOSIS — E872 Acidosis: Secondary | ICD-10-CM | POA: Diagnosis present

## 2016-06-26 DIAGNOSIS — R627 Adult failure to thrive: Secondary | ICD-10-CM | POA: Diagnosis not present

## 2016-06-26 DIAGNOSIS — N182 Chronic kidney disease, stage 2 (mild): Secondary | ICD-10-CM | POA: Diagnosis present

## 2016-06-26 DIAGNOSIS — D631 Anemia in chronic kidney disease: Secondary | ICD-10-CM | POA: Diagnosis present

## 2016-06-26 DIAGNOSIS — Z794 Long term (current) use of insulin: Secondary | ICD-10-CM | POA: Diagnosis not present

## 2016-06-26 DIAGNOSIS — N390 Urinary tract infection, site not specified: Secondary | ICD-10-CM

## 2016-06-26 DIAGNOSIS — G9341 Metabolic encephalopathy: Secondary | ICD-10-CM | POA: Diagnosis present

## 2016-06-26 LAB — URINALYSIS COMPLETE WITH MICROSCOPIC (ARMC ONLY)
SQUAMOUS EPITHELIAL / LPF: NONE SEEN
Specific Gravity, Urine: 1.031 — ABNORMAL HIGH (ref 1.005–1.030)

## 2016-06-26 LAB — COMPREHENSIVE METABOLIC PANEL
ALBUMIN: 2.5 g/dL — AB (ref 3.5–5.0)
ALK PHOS: 93 U/L (ref 38–126)
ALT: 7 U/L — AB (ref 14–54)
ANION GAP: 10 (ref 5–15)
AST: 16 U/L (ref 15–41)
BILIRUBIN TOTAL: 0.4 mg/dL (ref 0.3–1.2)
BUN: 39 mg/dL — AB (ref 6–20)
CALCIUM: 8.2 mg/dL — AB (ref 8.9–10.3)
CO2: 19 mmol/L — AB (ref 22–32)
CREATININE: 2.69 mg/dL — AB (ref 0.44–1.00)
Chloride: 99 mmol/L — ABNORMAL LOW (ref 101–111)
GFR calc Af Amer: 17 mL/min — ABNORMAL LOW (ref >60)
GFR calc non Af Amer: 15 mL/min — ABNORMAL LOW (ref >60)
GLUCOSE: 163 mg/dL — AB (ref 65–99)
Potassium: 5.5 mmol/L — ABNORMAL HIGH (ref 3.5–5.1)
SODIUM: 128 mmol/L — AB (ref 135–145)
TOTAL PROTEIN: 7.1 g/dL (ref 6.5–8.1)

## 2016-06-26 LAB — CBC WITH DIFFERENTIAL/PLATELET
BASOS PCT: 1 %
Basophils Absolute: 0.1 10*3/uL (ref 0–0.1)
Eosinophils Absolute: 0.1 10*3/uL (ref 0–0.7)
Eosinophils Relative: 1 %
HEMATOCRIT: 29.6 % — AB (ref 35.0–47.0)
HEMOGLOBIN: 9.6 g/dL — AB (ref 12.0–16.0)
LYMPHS ABS: 2.4 10*3/uL (ref 1.0–3.6)
Lymphocytes Relative: 17 %
MCH: 29.1 pg (ref 26.0–34.0)
MCHC: 32.3 g/dL (ref 32.0–36.0)
MCV: 90.2 fL (ref 80.0–100.0)
MONOS PCT: 6 %
Monocytes Absolute: 0.9 10*3/uL (ref 0.2–0.9)
NEUTROS ABS: 10.9 10*3/uL — AB (ref 1.4–6.5)
NEUTROS PCT: 75 %
Platelets: 504 10*3/uL — ABNORMAL HIGH (ref 150–440)
RBC: 3.28 MIL/uL — AB (ref 3.80–5.20)
RDW: 14 % (ref 11.5–14.5)
WBC: 14.4 10*3/uL — AB (ref 3.6–11.0)

## 2016-06-26 LAB — TSH: TSH: 2.445 u[IU]/mL (ref 0.350–4.500)

## 2016-06-26 LAB — TROPONIN I: Troponin I: 0.04 ng/mL (ref <0.03)

## 2016-06-26 MED ORDER — ONDANSETRON HCL 4 MG/2ML IJ SOLN
4.0000 mg | Freq: Four times a day (QID) | INTRAMUSCULAR | Status: DC | PRN
Start: 2016-06-26 — End: 2016-07-01

## 2016-06-26 MED ORDER — ONDANSETRON HCL 4 MG PO TABS: 4.0000 mg | ORAL_TABLET | Freq: Four times a day (QID) | ORAL | Status: DC | PRN

## 2016-06-26 MED ORDER — OXYCODONE HCL 5 MG PO TABS: 5.0000 mg | ORAL_TABLET | ORAL | Status: DC | PRN

## 2016-06-26 MED ORDER — SODIUM CHLORIDE 0.9 % IV BOLUS (SEPSIS)
1000.0000 mL | Freq: Once | INTRAVENOUS | Status: AC
  Administered 2016-06-26: 1000 mL via INTRAVENOUS

## 2016-06-26 MED ORDER — HEPARIN SODIUM (PORCINE) 5000 UNIT/ML IJ SOLN
5000.0000 [IU] | Freq: Three times a day (TID) | INTRAMUSCULAR | Status: DC
  Administered 2016-06-26 – 2016-07-01 (×11): 5000 [IU] via SUBCUTANEOUS
  Filled 2016-06-26 (×13): qty 1

## 2016-06-26 MED ORDER — ACETAMINOPHEN 325 MG PO TABS: 650.0000 mg | ORAL_TABLET | Freq: Four times a day (QID) | ORAL | Status: DC | PRN

## 2016-06-26 MED ORDER — DEXTROSE 5 % IV SOLN
500.0000 mg | Freq: Once | INTRAVENOUS | Status: AC
  Administered 2016-06-26: 500 mg via INTRAVENOUS
  Filled 2016-06-26: qty 0.5

## 2016-06-26 MED ORDER — SODIUM CHLORIDE 0.9% FLUSH
3.0000 mL | Freq: Two times a day (BID) | INTRAVENOUS | Status: DC
  Administered 2016-06-26 – 2016-06-30 (×3): 3 mL via INTRAVENOUS

## 2016-06-26 MED ORDER — CEFEPIME HCL 2 G IJ SOLR
500.0000 mg | INTRAMUSCULAR | Status: DC
  Administered 2016-06-27 – 2016-06-28 (×2): 500 mg via INTRAVENOUS
  Filled 2016-06-26 (×3): qty 0.5

## 2016-06-26 MED ORDER — SODIUM CHLORIDE 0.9 % IV SOLN
INTRAVENOUS | Status: DC
  Administered 2016-06-26 – 2016-06-27 (×2): via INTRAVENOUS
  Administered 2016-06-27: 1000 mL via INTRAVENOUS
  Administered 2016-06-28 – 2016-06-30 (×6): via INTRAVENOUS

## 2016-06-26 MED ORDER — ACETAMINOPHEN 650 MG RE SUPP: 650.0000 mg | Freq: Four times a day (QID) | RECTAL | Status: DC | PRN

## 2016-06-26 NOTE — Progress Notes (Signed)
Pharmacy Antibiotic Note  Dana HuddleJosephine E Riddle is a 82 y.o. female admitted on 06/26/2016 with UTI.  Pharmacy has been consulted for cefepime dosing.  Plan: Pt CrCl = 9.9 mL/min. Could not tell if patient is receiving HD which would alter dose of cefepime. Pt will receive cefepime 500 mg q 24 based on CrCl and no HD. Will continue to monitor for HD.  Height: 5\' 3"  (160 cm) Weight: 90 lb 2.7 oz (40.9 kg) IBW/kg (Calculated) : 52.4  Temp (24hrs), Avg:97.8 F (36.6 C), Min:97.8 F (36.6 C), Max:97.8 F (36.6 C)   Recent Labs Lab 06/26/16 1730  WBC 14.4*  CREATININE 2.69*    Estimated Creatinine Clearance: 9.9 mL/min (by C-G formula based on SCr of 2.69 mg/dL (H)).    No Known Allergies  Antimicrobials this admission: Cefepime 9/29 >>    Microbiology results: 9/29 UCx: Sent    Thank you for allowing pharmacy to be a part of this patient's care.  Horris LatinoHolly Jeniya Flannigan, PharmD Pharmacy Resident 06/26/2016 7:43 PM

## 2016-06-26 NOTE — ED Provider Notes (Signed)
[facility] Emergency Department Provider Note   ____________________________________________   None    (approximate)  I have reviewed the triage vital signs and the nursing notes.   HISTORY  Chief Complaint Altered Mental Status and Recurrent UTI   HPI Dana Riddle is a 82 y.o. female with a history of Alzheimer's dementia as well as chronic kidney disease and UTI who is presenting to the emergency department today with altered mental status. Per EMS, the staff at her nursing home says that she has been lethargic all week and was recently noted to have blood in her urine. EMS says that there was no confusion noted. However, the patient has been near nonverbal for them, only saying "ouch" when transferred from the stretcher to bed. I asked her questions but she does not respond.  Upon reviewing the patient's paperwork it appears that she has had both a course of Cipro and Macrobid recently for UTI.  Past Medical History:  Diagnosis Date  . Alzheimer's dementia   . Chronic kidney disease   . Diabetes mellitus without complication (HCC)   . Hyperlipidemia   . Hypertension     Patient Active Problem List   Diagnosis Date Noted  . Anemia 08/16/2015  . Hypotension 08/16/2015  . CKD (chronic kidney disease), stage II 08/16/2015  . Hyperlipidemia 08/16/2015  . Protein-calorie malnutrition, severe 08/15/2015  . Closed left hip fracture (HCC) 08/11/2015  . Sterile pyuria 08/11/2015  . Hyponatremia 08/11/2015  . Pneumonia 07/12/2015  . Hip fracture (HCC) 07/09/2015  . Hypertension 03/12/2015  . Diabetes mellitus without complication (HCC) 03/12/2015  . Urinary tract infection 03/12/2015    Past Surgical History:  Procedure Laterality Date  . FEMUR IM NAIL Left 08/13/2015   Procedure: INTRAMEDULLARY (IM) NAIL FEMORAL;  Surgeon: Kennedy Bucker, MD;  Location: [facility] ORS;  Service: Orthopedics;  Laterality: Left;  . INTRAMEDULLARY (IM) NAIL  INTERTROCHANTERIC Right 07/10/2015   Procedure: INTRAMEDULLARY (IM) NAIL INTERTROCHANTRIC;  Surgeon: Donato Heinz, MD;  Location: [facility] ORS;  Service: Orthopedics;  Laterality: Right;    Prior to Admission medications   Medication Sig Start Date End Date Taking? Authorizing Provider  acetaminophen (TYLENOL) 500 MG tablet Take 500 mg by mouth every 6 (six) hours as needed for fever or headache.    Historical Provider, MD  aspirin EC 81 MG tablet Take 81 mg by mouth daily.    Historical Provider, MD  clonazePAM (KLONOPIN) 0.5 MG tablet Take 0.5 tablets (0.25 mg total) by mouth daily as needed (for agitation). 08/16/15   Katharina Caper, MD  enoxaparin (LOVENOX) 40 MG/0.4ML injection Inject 0.4 mLs (40 mg total) into the skin daily. 08/16/15   Katharina Caper, MD  feeding supplement, ENSURE ENLIVE, (ENSURE ENLIVE) LIQD Take 237 mLs by mouth 2 (two) times daily between meals. 08/16/15   Katharina Caper, MD  ferrous sulfate 325 (65 FE) MG tablet Take 1 tablet (325 mg total) by mouth 2 (two) times daily with a meal. 07/12/15   Enid Baas, MD  Insulin Detemir (LEVEMIR FLEXTOUCH) 100 UNIT/ML Pen Inject 10 Units into the skin every morning.    Historical Provider, MD  mirtazapine (REMERON) 15 MG tablet Take 15 mg by mouth at bedtime.     Historical Provider, MD  nystatin cream (MYCOSTATIN) Apply topically 2 (two) times daily. 08/16/15   Katharina Caper, MD  oxyCODONE (OXY IR/ROXICODONE) 5 MG immediate release tablet Take 1-2 tablets (5-10 mg total) by mouth every 4 (four) hours as needed for breakthrough  pain ((for MODERATE breakthrough pain)). 08/16/15   Katharina Caper, MD  tamsulosin (FLOMAX) 0.4 MG CAPS capsule Take 1 capsule (0.4 mg total) by mouth daily. 07/12/15   Enid Baas, MD  traMADol (ULTRAM) 50 MG tablet Take 1-2 tablets (50-100 mg total) by mouth every 4 (four) hours as needed for moderate pain. 08/16/15   Katharina Caper, MD  Vortioxetine HBr (TRINTELLIX) 10 MG TABS Take 10 mg by mouth at  bedtime.     Historical Provider, MD  white petrolatum (VASELINE) GEL Apply 1 application topically as needed for dry skin. Pt applies to legs.    Historical Provider, MD    Allergies Review of patient's allergies indicates no known allergies.  Family History  Problem Relation Age of Onset  . Cancer Father     lung    Social History Social History  Substance Use Topics  . Smoking status: Current Every Day Smoker    Packs/day: 1.00  . Smokeless tobacco: Never Used  . Alcohol use No    Review of Systems Confounded bile to mental status. Level V caveat. ____________________________________________   PHYSICAL EXAM:  VITAL SIGNS: ED Triage Vitals  Enc Vitals Group     BP 06/26/16 1730 (!) 121/91     Pulse Rate 06/26/16 1730 77     Resp 06/26/16 1730 15     Temp 06/26/16 1730 97.8 F (36.6 C)     Temp Source 06/26/16 1730 Oral     SpO2 06/26/16 1730 98 %     Weight 06/26/16 1731 90 lb 2.7 oz (40.9 kg)     Height 06/26/16 1731 5\' 3"  (1.6 m)     Head Circumference --      Peak Flow --      Pain Score --      Pain Loc --      Pain Edu? --      Excl. in GC? --     Constitutional: Alert. in no acute distress. Eyes: Conjunctivae are normal. PERRL. EOMI. Head: Atraumatic. Nose: No congestion/rhinnorhea. Mouth/Throat: Mucous membranes are moist.  Neck: No stridor.   Cardiovascular: Normal rate, regular rhythm. Grossly normal heart sounds.   Respiratory: Normal respiratory effort.  No retractions. Lungs CTAB. Gastrointestinal: Soft and nontender. No distention.  Musculoskeletal: No lower extremity tenderness nor edema.  No joint effusions. Neurologic:  Normal speech and language. No gross focal neurologic deficits are appreciated.  Skin:  Skin is warm, dry and intact. No rash noted. Psychiatric: Mood and affect are normal. Speech and behavior are normal.  ____________________________________________   LABS (all labs ordered are listed, but only abnormal results are  displayed)  Labs Reviewed  URINALYSIS COMPLETEWITH MICROSCOPIC ([facility] ONLY) - Abnormal; Notable for the following:       Result Value   Color, Urine BROWN (*)    APPearance TURBID (*)    Glucose, UA   (*)    Value: TEST NOT REPORTED DUE TO COLOR INTERFERENCE OF URINE PIGMENT   Bilirubin Urine   (*)    Value: TEST NOT REPORTED DUE TO COLOR INTERFERENCE OF URINE PIGMENT   Ketones, ur   (*)    Value: TEST NOT REPORTED DUE TO COLOR INTERFERENCE OF URINE PIGMENT   Specific Gravity, Urine 1.031 (*)    Hgb urine dipstick   (*)    Value: TEST NOT REPORTED DUE TO COLOR INTERFERENCE OF URINE PIGMENT   Protein, ur   (*)    Value: TEST NOT REPORTED DUE TO COLOR INTERFERENCE OF  URINE PIGMENT   Nitrite   (*)    Value: TEST NOT REPORTED DUE TO COLOR INTERFERENCE OF URINE PIGMENT   Leukocytes, UA   (*)    Value: TEST NOT REPORTED DUE TO COLOR INTERFERENCE OF URINE PIGMENT   Bacteria, UA MANY (*)    All other components within normal limits  CBC WITH DIFFERENTIAL/PLATELET - Abnormal; Notable for the following:    WBC 14.4 (*)    RBC 3.28 (*)    Hemoglobin 9.6 (*)    HCT 29.6 (*)    Platelets 504 (*)    Neutro Abs 10.9 (*)    All other components within normal limits  COMPREHENSIVE METABOLIC PANEL  TROPONIN I  TSH   ____________________________________________  EKG  ED ECG REPORT I, Arelia LongestSchaevitz,  Galit Urich M, the attending physician, personally viewed and interpreted this ECG.   Date: 06/26/2016  EKG Time: 1818  Rate: 76  Rhythm: normal sinus rhythm with PACs  Axis: Normal  Intervals:none  ST&T Change: No ST segment elevation or depression. No abnormal T-wave inversion.  ____________________________________________  RADIOLOGY  DG Chest 2 View (Accession [id number]) (Order [id number])  Imaging  Date: 06/26/2016 Department: [facility] EMERGENCY DEPARTMENT Released By/Authorizing: Myrna Blazeravid Matthew Eliese Kerwood, MD (auto-released)  Exam Information   Status Exam Begun   Exam Ended   Final With PACs.  06/26/2016 5:46 PM 06/26/2016 5:56 PM  PACS Images   Show images for DG Chest 2 View  Study Result   CLINICAL DATA:  Lethargy and hematuria for 1 week, altered mental status. Left lower field rales.  EXAM: CHEST  2 VIEW  COMPARISON:  Chest x-rays dated 08/11/2015 and 07/11/2015.  FINDINGS: Heart size and mediastinal contours are normal. Atherosclerotic changes again noted at the aortic arch.  Mildly prominent interstitial lung markings again noted suggesting chronic interstitial lung disease. No new lung findings. No evidence of pneumonia. No pleural effusion no pneumothorax seen.  Mild degenerative spurring again noted within the slightly kyphotic thoracic spine. Probable mild compression fracture deformities noted in the upper and mid thoracic spine, difficult to characterize due to the patient's overall osteopenia, likely chronic given the stable kyphosis.  IMPRESSION: 1. No acute findings.  No evidence of pneumonia. 2. Suspected chronic interstitial lung disease. 3. Aortic atherosclerosis.   Electronically Signed   By: Bary RichardStan  Maynard M.D.   On: 06/26/2016 18:03  CT Head Wo Contrast (Accession [id number]) (Order [id number])  Imaging  Date: 06/26/2016 Department: [facility]ENC[facility] Released By/Authorizing: Myrna Blazeravid Matthew Lateya Dauria, MD (auto-released)  Exam Information   Status Exam Begun  Exam Ended   Final [99] 06/26/2016 5:39 PM 06/26/2016 5:43 PM  PACS Images   Show images for CT Head Wo Contrast  Study Result   CLINICAL DATA:  Altered mental status.  EXAM: CT HEAD WITHOUT CONTRAST  TECHNIQUE: Contiguous axial images were obtained from the base of the skull through the vertex without intravenous contrast.  COMPARISON:  CT scan of July 09, 2015.  FINDINGS: Brain: Mild diffuse cortical atrophy is noted. Mild chronic ischemic white matter disease is noted. No mass effect or  midline shift is noted. Ventricular size is within normal limits. There is no evidence of mass lesion, hemorrhage or acute infarction.  Vascular: Atherosclerotic calcifications of internal carotid arteries is noted.  Skull: Bony calvarium appears intact.  Sinuses/Orbits: Mild ethmoid and left sphenoid sinusitis is noted.  Other: None.  IMPRESSION: Mild diffuse cortical atrophy. Mild chronic ischemic white matter disease. No acute intracranial abnormality  seen   Electronically Signed   By: Lupita Raider, M.D.   On: 06/26/2016 17:51      ____________________________________________   PROCEDURES  Procedure(s) performed:   Procedures  Critical Care performed:   ____________________________________________   INITIAL IMPRESSION / ASSESSMENT AND PLAN / ED COURSE  Pertinent labs & imaging results that were available during my care of the patient were reviewed by me and considered in my medical decision making (see chart for details).  Straight catheter urine is white and red and thick appearing.  Clinical Course    ----------------------------------------- 7:25 PM on 06/26/2016 -----------------------------------------  Patient with what appears to be severe cystitis with elevated white blood cell count. Because the patient has had recently, 2 course of antibiotics. I will cover her with broad-spectrum anabiotic spread will be admitted to the hospital. Patient with also acute renal failure and mild hyperkalemia without any signs of the hyperkalemia affecting EKG. We'll give fluids for the hyperkalemia and renal failure. Signed out to Dr. Clint Guy.  ____________________________________________   FINAL CLINICAL IMPRESSION(S) / ED DIAGNOSES  UTI.  Altered mental status.  Hyperkalemia. Acute renal failure.    NEW MEDICATIONS STARTED DURING THIS VISIT:  New Prescriptions   No medications on file     Note:  This document was prepared using Dragon voice  recognition software and may include unintentional dictation errors.    Myrna Blazer, MD 06/26/16 [phone]

## 2016-06-26 NOTE — ED Triage Notes (Signed)
Pt arrived via EMS from [facility] for reports of lethargy and hematuria for one week. EMS reports VSS, CBG 155. Pt with noted dementia.

## 2016-06-26 NOTE — ED Notes (Signed)
Pt resting, eyes closed, pt arouses to verbal stimuli, but does not answer questions. When pt touched she states "leave me, get out." skin slightly pale, warm and dry. resps unlabored. No s/s of infiltration noted to iv site.

## 2016-06-26 NOTE — H&P (Signed)
[facility] [place] [facility]   PATIENT NAME: Dana Riddle    MR#:  [id number]  DATE OF BIRTH:  1929-07-07   DATE OF ADMISSION:  06/26/2016  PRIMARY CARE PHYSICIAN: Vonita MossMark Crissman, MD   REQUESTING/REFERRING PHYSICIAN: Roxan Hockeyobinson  CHIEF COMPLAINT:   Chief Complaint  Patient presents with  . Altered Mental Status  . Recurrent UTI    HISTORY OF PRESENT ILLNESS:  Dana SmokerJosephine Palo  is a 82 y.o. female with a known history of dementia, type 2 diabetes insulin requiring who is presenting with altered mental status from her nursing facility. Patient is lethargic so to Hospital further workup and evaluation.  She is unable to provide meaningful information given mental status  PAST MEDICAL HISTORY:   Past Medical History:  Diagnosis Date  . Alzheimer's dementia   . Chronic kidney disease   . Diabetes mellitus without complication (HCC)   . Hyperlipidemia   . Hypertension     PAST SURGICAL HISTORY:   Past Surgical History:  Procedure Laterality Date  . FEMUR IM NAIL Left 08/13/2015   Procedure: INTRAMEDULLARY (IM) NAIL FEMORAL;  Surgeon: Kennedy BuckerMichael Menz, MD;  Location: [facility] ORS;  Service: Orthopedics;  Laterality: Left;  . INTRAMEDULLARY (IM) NAIL INTERTROCHANTERIC Right 07/10/2015   Procedure: INTRAMEDULLARY (IM) NAIL INTERTROCHANTRIC;  Surgeon: Donato HeinzJames P Hooten, MD;  Location: [facility] ORS;  Service: Orthopedics;  Laterality: Right;    SOCIAL HISTORY:   Social History  Substance Use Topics  . Smoking status: Current Every Day Smoker    Packs/day: 1.00  . Smokeless tobacco: Never Used  . Alcohol use No    FAMILY HISTORY:   Family History  Problem Relation Age of Onset  . Cancer Father     lung    DRUG ALLERGIES:  No Known Allergies  REVIEW OF SYSTEMS:  Unable to obtain given patient's mental status   MEDICATIONS AT HOME:   Prior to Admission medications   Medication Sig Start Date End Date Taking? Authorizing Provider  acetaminophen  (TYLENOL) 500 MG tablet Take 500 mg by mouth every 6 (six) hours as needed for fever or headache.   Yes Historical Provider, MD  alendronate (FOSAMAX) 35 MG tablet Take 35 mg by mouth every 7 (seven) days. Take with a full glass of water on an empty stomach. Wednesdays   Yes Historical Provider, MD  aspirin EC 81 MG tablet Take 81 mg by mouth daily.   Yes Historical Provider, MD  calcium carbonate (TUMS - DOSED IN MG ELEMENTAL CALCIUM) 500 MG chewable tablet Chew 1 tablet by mouth 2 (two) times daily.   Yes Historical Provider, MD  feeding supplement, ENSURE ENLIVE, (ENSURE ENLIVE) LIQD Take 237 mLs by mouth 2 (two) times daily between meals. 08/16/15  Yes Katharina Caperima Vaickute, MD  ferrous sulfate 325 (65 FE) MG tablet Take 1 tablet (325 mg total) by mouth 2 (two) times daily with a meal. 07/12/15  Yes Enid Baasadhika Kalisetti, MD  insulin aspart (NOVOLOG) 100 UNIT/ML injection Inject into the skin. Sliding scale 200-249 4 units 250-299 6 units 300-349 8 units 350-399 10 units   Yes Historical Provider, MD  mirtazapine (REMERON) 7.5 MG tablet Take 15 mg by mouth at bedtime.    Yes Historical Provider, MD  senna (SENOKOT) 8.6 MG TABS tablet Take 1 tablet by mouth 3 (three) times a week. Monday Wednesday Friday   Yes Historical Provider, MD  sertraline (ZOLOFT) 50 MG tablet Take 50 mg by mouth daily.   Yes Historical Provider, MD  sitaGLIPtin (JANUVIA) 25 MG tablet Take 25 mg by mouth daily.   Yes Historical Provider, MD      VITAL SIGNS:  Blood pressure (!) 112/45, pulse 70, temperature 97.8 F (36.6 C), temperature source Oral, resp. rate 15, height 5\' 3"  (1.6 m), weight 40.9 kg (90 lb 2.7 oz), SpO2 100 %.  PHYSICAL EXAMINATION:   VITAL SIGNS: Vitals:   06/26/16 1945 06/26/16 2000  BP:  (!) 112/45  Pulse: 63 70  Resp: 15 15  Temp:     GENERAL:82 y.o.female moderate distress given mental status. Weak and frail appearing  HEAD: Normocephalic, atraumatic.  EYES: Pupils equal, round, reactive to  light. Unable to assess extraocular muscles given mental status/medical condition. No scleral icterus.  MOUTH: Moist mucosal membrane. Dentition intact. No abscess noted.  EAR, NOSE, THROAT: Clear without exudates. No external lesions.  NECK: Supple. No thyromegaly. No nodules. No JVD.  PULMONARY: Clear to ascultation, without wheeze rails or rhonci. No use of accessory muscles, Good respiratory effort. good air entry bilaterally CHEST: Nontender to palpation.  CARDIOVASCULAR: S1 and S2. Regular rate and rhythm. No murmurs, rubs, or gallops. No edema. Pedal pulses 2+ bilaterally.  GASTROINTESTINAL: Soft, nontender, nondistended. No masses. Positive bowel sounds. No hepatosplenomegaly.  MUSCULOSKELETAL: No swelling, clubbing, or edema. Range of motion full in all extremities.  NEUROLOGIC: Unable to assess given mental status/medical condition SKIN: No ulceration, lesions, rashes, or cyanosis. Skin warm and dry. Turgor intact.  PSYCHIATRIC: Unable to assess given mental status/medical condition     LABORATORY PANEL:   CBC  Recent Labs Lab 06/26/16 1730  WBC 14.4*  HGB 9.6*  HCT 29.6*  PLT 504*   ------------------------------------------------------------------------------------------------------------------  Chemistries   Recent Labs Lab 06/26/16 1730  NA 128*  K 5.5*  CL 99*  CO2 19*  GLUCOSE 163*  BUN 39*  CREATININE 2.69*  CALCIUM 8.2*  AST 16  ALT 7*  ALKPHOS 93  BILITOT 0.4   ------------------------------------------------------------------------------------------------------------------  Cardiac Enzymes  Recent Labs Lab 06/26/16 1730  TROPONINI 0.04*   ------------------------------------------------------------------------------------------------------------------  RADIOLOGY:  Dg Chest 2 View  Result Date: 06/26/2016 CLINICAL DATA:  Lethargy and hematuria for 1 week, altered mental status. Left lower field rales. EXAM: CHEST  2 VIEW COMPARISON:   Chest x-rays dated 08/11/2015 and 07/11/2015. FINDINGS: Heart size and mediastinal contours are normal. Atherosclerotic changes again noted at the aortic arch. Mildly prominent interstitial lung markings again noted suggesting chronic interstitial lung disease. No new lung findings. No evidence of pneumonia. No pleural effusion no pneumothorax seen. Mild degenerative spurring again noted within the slightly kyphotic thoracic spine. Probable mild compression fracture deformities noted in the upper and mid thoracic spine, difficult to characterize due to the patient's overall osteopenia, likely chronic given the stable kyphosis. IMPRESSION: 1. No acute findings.  No evidence of pneumonia. 2. Suspected chronic interstitial lung disease. 3. Aortic atherosclerosis. Electronically Signed   By: Bary Richard M.D.   On: 06/26/2016 18:03   Ct Head Wo Contrast  Result Date: 06/26/2016 CLINICAL DATA:  Altered mental status. EXAM: CT HEAD WITHOUT CONTRAST TECHNIQUE: Contiguous axial images were obtained from the base of the skull through the vertex without intravenous contrast. COMPARISON:  CT scan of July 09, 2015. FINDINGS: Brain: Mild diffuse cortical atrophy is noted. Mild chronic ischemic white matter disease is noted. No mass effect or midline shift is noted. Ventricular size is within normal limits. There is no evidence of mass lesion, hemorrhage or acute infarction. Vascular: Atherosclerotic calcifications of internal carotid  arteries is noted. Skull: Bony calvarium appears intact. Sinuses/Orbits: Mild ethmoid and left sphenoid sinusitis is noted. Other: None. IMPRESSION: Mild diffuse cortical atrophy. Mild chronic ischemic white matter disease. No acute intracranial abnormality seen Electronically Signed   By: Lupita Raider, M.D.   On: 06/26/2016 17:51    EKG:   Orders placed or performed during the hospital encounter of 06/26/16  . ED EKG  . ED EKG  . EKG 12-Lead  . EKG 12-Lead    IMPRESSION AND  PLAN:   82 year old Caucasian female history of type 2 diabetes and dementia presenting with altered mental status  1. Acute renal failure: IV fluid hydration and follow urine output renal function consult nephrology avoid further nephrotoxic agents 2. Hyperkalemia: IV fluid hydration follow potassium level III. Urinary tract infection placed on cefepime in emergency department-will continue antibiotic for now follow culture data and adjust accordingly 4. Type 2 diabetes insulin requiring hold oral agents add sliding scale coverage     All the records are reviewed and case discussed with ED provider. Management plans discussed with the patient, family and they are in agreement.  CODE STATUS: Full  TOTAL TIME TAKING CARE OF THIS PATIENT: 33 minutes.    Tasha Diaz,  Mardi Mainland.D on 06/26/2016 at 8:22 PM  Between 7am to 6pm - Pager - [phone]  After 6pm: House Pager: - [phone]  [facility]  Office  [phone]  CC: Primary care physician; Vonita Moss, MD

## 2016-06-27 ENCOUNTER — Inpatient Hospital Stay: Payer: Medicare Other

## 2016-06-27 ENCOUNTER — Inpatient Hospital Stay
Admit: 2016-06-27 | Discharge: 2016-06-27 | Disposition: A | Discharge status: Home / Self Care | Payer: Medicare Other | Attending: Internal Medicine | Admitting: Internal Medicine

## 2016-06-27 DIAGNOSIS — F0280 Dementia in other diseases classified elsewhere without behavioral disturbance: Secondary | ICD-10-CM

## 2016-06-27 DIAGNOSIS — G309 Alzheimer's disease, unspecified: Secondary | ICD-10-CM

## 2016-06-27 LAB — TROPONIN I: Troponin I: 0.03 ng/mL (ref <0.03)

## 2016-06-27 LAB — CBC
HEMATOCRIT: 27.9 % — AB (ref 35.0–47.0)
Hemoglobin: 9.2 g/dL — ABNORMAL LOW (ref 12.0–16.0)
MCH: 29.9 pg (ref 26.0–34.0)
MCHC: 33.1 g/dL (ref 32.0–36.0)
MCV: 90.4 fL (ref 80.0–100.0)
PLATELETS: 413 10*3/uL (ref 150–440)
RBC: 3.08 MIL/uL — ABNORMAL LOW (ref 3.80–5.20)
RDW: 13.2 % (ref 11.5–14.5)
WBC: 12.1 10*3/uL — AB (ref 3.6–11.0)

## 2016-06-27 LAB — BASIC METABOLIC PANEL
ANION GAP: 5 (ref 5–15)
BUN: 35 mg/dL — ABNORMAL HIGH (ref 6–20)
CALCIUM: 8 mg/dL — AB (ref 8.9–10.3)
CO2: 21 mmol/L — AB (ref 22–32)
CREATININE: 2.36 mg/dL — AB (ref 0.44–1.00)
Chloride: 108 mmol/L (ref 101–111)
GFR, EST AFRICAN AMERICAN: 20 mL/min — AB (ref >60)
GFR, EST NON AFRICAN AMERICAN: 18 mL/min — AB (ref >60)
Glucose, Bld: 104 mg/dL — ABNORMAL HIGH (ref 65–99)
Potassium: 4.5 mmol/L (ref 3.5–5.1)
SODIUM: 134 mmol/L — AB (ref 135–145)

## 2016-06-27 LAB — GLUCOSE, CAPILLARY
GLUCOSE-CAPILLARY: 104 mg/dL — AB (ref 65–99)
GLUCOSE-CAPILLARY: 128 mg/dL — AB (ref 65–99)
GLUCOSE-CAPILLARY: 269 mg/dL — AB (ref 65–99)
Glucose-Capillary: 98 mg/dL (ref 65–99)
Glucose-Capillary: 107 mg/dL — ABNORMAL HIGH (ref 65–99)

## 2016-06-27 LAB — MRSA PCR SCREENING: MRSA BY PCR: POSITIVE — AB

## 2016-06-27 MED ORDER — SERTRALINE HCL 25 MG PO TABS
50.0000 mg | ORAL_TABLET | Freq: Every day | ORAL | Status: DC
  Administered 2016-06-27 – 2016-07-01 (×5): 50 mg via ORAL
  Filled 2016-06-27 (×5): qty 2

## 2016-06-27 MED ORDER — INSULIN ASPART 100 UNIT/ML ~~LOC~~ SOLN
0.0000 [IU] | Freq: Three times a day (TID) | SUBCUTANEOUS | Status: DC
  Administered 2016-06-27: 5 [IU] via SUBCUTANEOUS
  Administered 2016-06-27: 1 [IU] via SUBCUTANEOUS
  Administered 2016-06-28 – 2016-06-29 (×2): 2 [IU] via SUBCUTANEOUS
  Administered 2016-06-29: 3 [IU] via SUBCUTANEOUS
  Administered 2016-06-30: 1 [IU] via SUBCUTANEOUS
  Administered 2016-06-30: 3 [IU] via SUBCUTANEOUS
  Administered 2016-07-01: 2 [IU] via SUBCUTANEOUS
  Administered 2016-07-01: 1 [IU] via SUBCUTANEOUS
  Filled 2016-06-27: qty 3
  Filled 2016-06-27 (×2): qty 1
  Filled 2016-06-27: qty 3
  Filled 2016-06-27: qty 5
  Filled 2016-06-27: qty 1
  Filled 2016-06-27 (×5): qty 2

## 2016-06-27 MED ORDER — SENNA 8.6 MG PO TABS
1.0000 | ORAL_TABLET | ORAL | Status: DC
  Administered 2016-06-29 – 2016-07-01 (×2): 8.6 mg via ORAL
  Filled 2016-06-27 (×2): qty 1

## 2016-06-27 MED ORDER — DRONABINOL 2.5 MG PO CAPS
2.5000 mg | ORAL_CAPSULE | Freq: Two times a day (BID) | ORAL | Status: DC
  Administered 2016-06-27 – 2016-07-01 (×4): 2.5 mg via ORAL
  Filled 2016-06-27 (×5): qty 1

## 2016-06-27 MED ORDER — FERROUS SULFATE 325 (65 FE) MG PO TABS
325.0000 mg | ORAL_TABLET | Freq: Two times a day (BID) | ORAL | Status: DC
  Administered 2016-06-27 – 2016-07-01 (×8): 325 mg via ORAL
  Filled 2016-06-27 (×8): qty 1

## 2016-06-27 MED ORDER — ASPIRIN EC 81 MG PO TBEC
81.0000 mg | DELAYED_RELEASE_TABLET | Freq: Every day | ORAL | Status: DC
  Administered 2016-06-27 – 2016-07-01 (×5): 81 mg via ORAL
  Filled 2016-06-27 (×5): qty 1

## 2016-06-27 MED ORDER — CALCIUM CARBONATE ANTACID 500 MG PO CHEW
1.0000 | CHEWABLE_TABLET | Freq: Two times a day (BID) | ORAL | Status: DC
Start: 2016-06-27 — End: 2016-07-01
  Administered 2016-06-27 – 2016-07-01 (×7): 200 mg via ORAL
  Filled 2016-06-27 (×10): qty 1

## 2016-06-27 MED ORDER — ENSURE ENLIVE PO LIQD
237.0000 mL | Freq: Two times a day (BID) | ORAL | Status: DC
  Administered 2016-06-27 – 2016-07-01 (×8): 237 mL via ORAL

## 2016-06-27 MED ORDER — CHLORHEXIDINE GLUCONATE CLOTH 2 % EX PADS
6.0000 | MEDICATED_PAD | Freq: Every day | CUTANEOUS | Status: AC
  Administered 2016-06-27 – 2016-07-01 (×5): 6 via TOPICAL

## 2016-06-27 MED ORDER — INSULIN ASPART 100 UNIT/ML ~~LOC~~ SOLN
0.0000 [IU] | Freq: Every day | SUBCUTANEOUS | Status: DC
  Administered 2016-06-29 – 2016-06-30 (×2): 2 [IU] via SUBCUTANEOUS
  Filled 2016-06-27: qty 2

## 2016-06-27 MED ORDER — ALENDRONATE SODIUM 35 MG PO TABS: 35.0000 mg | ORAL_TABLET | ORAL | Status: DC

## 2016-06-27 MED ORDER — MUPIROCIN 2 % EX OINT
1.0000 "application " | TOPICAL_OINTMENT | Freq: Two times a day (BID) | CUTANEOUS | Status: DC
  Administered 2016-06-27 – 2016-07-01 (×7): 1 via NASAL
  Filled 2016-06-27: qty 22

## 2016-06-27 MED ORDER — MIRTAZAPINE 15 MG PO TABS
15.0000 mg | ORAL_TABLET | Freq: Every day | ORAL | Status: DC
  Administered 2016-06-28 – 2016-06-30 (×3): 15 mg via ORAL
  Filled 2016-06-27 (×5): qty 1

## 2016-06-27 NOTE — Progress Notes (Signed)
[facility] [place] at [facility]   PATIENT NAME: Dana SmokerJosephine Riddle    MR#:  [id number]  DATE OF BIRTH:  08/24/1929  SUBJECTIVE:  CHIEF COMPLAINT:   Chief Complaint  Patient presents with  . Altered Mental Status  . Recurrent UTI   The patient is 82 year old female with asthma, history significant for history of Alzheimer's dementia, CK D, ABDs mellitus, hypertension, hyperlipidemia, who presents to the hospital with altered mental status, acute and chronic renal failure. Patient is not able to provide review of systems, she refuses to eat, although upon further questioning, it appears that she is able to nod her head or shaking her head intermittently when she wants to, but refuses to speak.   Review of Systems  Unable to perform ROS: Patient nonverbal    VITAL SIGNS: Blood pressure 115/74, pulse 68, temperature 97.7 F (36.5 C), temperature source Oral, resp. rate 20, height 5\' 3"  (1.6 m), weight 40.4 kg (89 lb 1.6 oz), SpO2 100 %.  PHYSICAL EXAMINATION:   GENERAL:  82 y.o.-year-old patient lying in the bed with no acute distress, intermittently not associated head to questions.  Patient lays in bed with closed eyes and in fetal position, nonverbal EYES: Pupils equal, round, reactive to light and accommodation. No scleral icterus. Extraocular muscles intact.  HEENT: Head atraumatic, normocephalic. Oropharynx and nasopharynx clear.  NECK:  Supple, no jugular venous distention. No thyroid enlargement, no tenderness.  LUNGS: Normal breath sounds bilaterally, no wheezing, rales,rhonchi or crepitation. No use of accessory muscles of respiration.  CARDIOVASCULAR: S1, S2 normal. No murmurs, rubs, or gallops.  ABDOMEN: Soft, nontender, nondistended. Bowel sounds present. No organomegaly or mass.  EXTREMITIES: No pedal edema, cyanosis, or clubbing.  NEUROLOGIC: Cranial nerves II through XII are intact. Muscle strength 5/5 in all extremities. Sensation intact. Gait  not checked.  PSYCHIATRIC: The patient is sleepy, unable to assess orientation, although intermittently nods or shakes her head when asked questions.  SKIN: No obvious rash, lesion, or ulcer.   ORDERS/RESULTS REVIEWED:   CBC  Recent Labs Lab 06/26/16 1730 06/27/16 0431  WBC 14.4* 12.1*  HGB 9.6* 9.2*  HCT 29.6* 27.9*  PLT 504* 413  MCV 90.2 90.4  MCH 29.1 29.9  MCHC 32.3 33.1  RDW 14.0 13.2  LYMPHSABS 2.4  --   MONOABS 0.9  --   EOSABS 0.1  --   BASOSABS 0.1  --    ------------------------------------------------------------------------------------------------------------------  Chemistries   Recent Labs Lab 06/26/16 1730 06/27/16 0431  NA 128* 134*  K 5.5* 4.5  CL 99* 108  CO2 19* 21*  GLUCOSE 163* 104*  BUN 39* 35*  CREATININE 2.69* 2.36*  CALCIUM 8.2* 8.0*  AST 16  --   ALT 7*  --   ALKPHOS 93  --   BILITOT 0.4  --    ------------------------------------------------------------------------------------------------------------------ estimated creatinine clearance is 11.1 mL/min (by C-G formula based on SCr of 2.36 mg/dL (H)). ------------------------------------------------------------------------------------------------------------------  Recent Labs  06/26/16 1730  TSH 2.445    Cardiac Enzymes  Recent Labs Lab 06/26/16 1730 06/27/16 0431  TROPONINI 0.04* 0.03*   ------------------------------------------------------------------------------------------------------------------ Invalid input(s): POCBNP ---------------------------------------------------------------------------------------------------------------  RADIOLOGY: Dg Chest 2 View  Result Date: 06/26/2016 CLINICAL DATA:  Lethargy and hematuria for 1 week, altered mental status. Left lower field rales. EXAM: CHEST  2 VIEW COMPARISON:  Chest x-rays dated 08/11/2015 and 07/11/2015. FINDINGS: Heart size and mediastinal contours are normal. Atherosclerotic changes again noted at the aortic  arch. Mildly prominent interstitial lung markings again  noted suggesting chronic interstitial lung disease. No new lung findings. No evidence of pneumonia. No pleural effusion no pneumothorax seen. Mild degenerative spurring again noted within the slightly kyphotic thoracic spine. Probable mild compression fracture deformities noted in the upper and mid thoracic spine, difficult to characterize due to the patient's overall osteopenia, likely chronic given the stable kyphosis. IMPRESSION: 1. No acute findings.  No evidence of pneumonia. 2. Suspected chronic interstitial lung disease. 3. Aortic atherosclerosis. Electronically Signed   By: Bary Richard M.D.   On: 06/26/2016 18:03   Ct Head Wo Contrast  Result Date: 06/26/2016 CLINICAL DATA:  Altered mental status. EXAM: CT HEAD WITHOUT CONTRAST TECHNIQUE: Contiguous axial images were obtained from the base of the skull through the vertex without intravenous contrast. COMPARISON:  CT scan of July 09, 2015. FINDINGS: Brain: Mild diffuse cortical atrophy is noted. Mild chronic ischemic white matter disease is noted. No mass effect or midline shift is noted. Ventricular size is within normal limits. There is no evidence of mass lesion, hemorrhage or acute infarction. Vascular: Atherosclerotic calcifications of internal carotid arteries is noted. Skull: Bony calvarium appears intact. Sinuses/Orbits: Mild ethmoid and left sphenoid sinusitis is noted. Other: None. IMPRESSION: Mild diffuse cortical atrophy. Mild chronic ischemic white matter disease. No acute intracranial abnormality seen Electronically Signed   By: Lupita Raider, M.D.   On: 06/26/2016 17:51   US Renal  Result Date: 06/27/2016 CLINICAL DATA:  Acute renal failure EXAM: RENAL / URINARY TRACT ULTRASOUND COMPLETE COMPARISON:  None. FINDINGS: Right Kidney: Length: 11.9 cm. There is marked hydronephrosis on the right. The right renal pelvis and ureter are also dilated. Debris and stones are seen within  the dilated renal collecting system. Renal stones are noted. The largest single stone measures approximately 13 mm. Left Kidney: Length: 11.3 cm. Moderate to severe hydronephrosis with internal echoes within the dilated renal collecting system. Bladder: The bladder is abnormal containing a masslike region measuring 7.2 x 6.4 x 4.5 cm. However, on several images, there appears to be a fluid level. Blood flow was not excluded in this region. However, there is falling debris within the bladder seen on cine images. IMPRESSION: 1. Bilateral hydronephrosis and ureterectasis. Numerous stones in the right kidney and right renal collecting system. Probable small stones in the left renal collecting system. 2. Masslike material within the bladder. On several images, there appears to be a fluid level suggesting this may represent tumefactive debris and the possible color on Doppler imaging could be due to falling debris. That being said, a bladder mass is not excluded on this study alone. These results will be called to the ordering clinician or representative by the Radiologist Assistant, and communication documented in the PACS or zVision Dashboard. Electronically Signed   By: Gerome Sam III M.D   On: 06/27/2016 10:30    EKG:  Orders placed or performed during the hospital encounter of 06/26/16  . ED EKG  . ED EKG  . EKG 12-Lead  . EKG 12-Lead    ASSESSMENT AND PLAN:  Active Problems:   Acute renal failure (ARF) (HCC)  #1. Metabolic encephalopathy , likely delirium due to urinary tract infection, cannot rule out underlying organic disease, getting psychiatrist involved for recommendations, continue supportive therapy, IV fluids. Getting palliative care to discuss with family further treatment goals.  #2. Acute on chronic renal failure, improved with IV fluid administration, follow creatinine in the morning #3. Hyperkalemia, resolved with IV fluid administration #4. Elevated troponin, likely demand  ischemia,get  echocardiogram #5. Leukocytosis, likely due to urinary tract infection, improving with antibiotic therapy #6. Urinary tract infection, continue cefepime, urine cultures are pending   Management plans discussed with the patient, family and they are in agreement.   DRUG ALLERGIES: No Known Allergies  CODE STATUS:     Code Status Orders        Start     Ordered   06/26/16 2012  Full code  Continuous     06/26/16 2012    Code Status History    Date Active Date Inactive Code Status Order ID Comments User Context   08/13/2015  6:36 PM 08/16/2015 12:48 PM Full Code [id number]  Kennedy Bucker, MD Inpatient   08/11/2015  4:04 PM 08/13/2015  6:36 PM Full Code [id number]  Shaune Pollack, MD Inpatient   07/10/2015 10:13 PM 07/13/2015  4:49 PM Full Code [id number]  Donato Heinz, MD Inpatient   07/09/2015  8:33 PM 07/10/2015 10:13 PM Full Code [id number]  Donato Heinz, MD Inpatient   07/09/2015  4:07 PM 07/09/2015  8:33 PM Full Code [id number]  Alford Highland, MD ED      TOTAL TIME TAKING CARE OF THIS PATIENT: 40 minutes.    Katharina Caper M.D on 06/27/2016 at 1:16 PM  Between 7am to 6pm - Pager - [phone]  After 6pm go to [email or web address] - password [email or web address] [facility]  [facility]  Office  [phone]  CC: Primary care physician; Vonita Moss, MD

## 2016-06-27 NOTE — Progress Notes (Signed)
Called Dr. Emmit PomfretHugelmeyer regarding diet order for patient.  Appropriate orders were placed.  Arturo MortonClay, Deangelo Berns N  06/27/2016 1:43 AM

## 2016-06-27 NOTE — Plan of Care (Signed)
Problem: Education: Goal: Knowledge of [facility] Education information/materials will improve Outcome: Not Progressing Patient has dementia.  Problem: Health Behavior/Discharge Planning: Goal: Ability to manage health-related needs will improve Outcome: Not Progressing Patient has dementia.   

## 2016-06-27 NOTE — Progress Notes (Signed)
Pharmacist - Prescriber Communication  Fosamax 35 mg po weekly discontinued per [facility] bisphosphonate policy.  Debria Broecker A. [place], [place]arm.D., BCPS Clinical Pharmacist 06/27/16 [phone]

## 2016-06-27 NOTE — Consult Note (Signed)
[facility] Face-to-Face Psychiatry Consult   Reason for Consult:  Consult for 82 year old woman with a known diagnosis of Alzheimer's disease. Came into the hospital with 1 week of worsening lethargy. Has a urinary tract infection. Concern about her not eating. Referring Physician:  Judeen Hammans Patient Identification: Dana Riddle MRN:  [id number] Principal Diagnosis: Alzheimer's dementia Diagnosis:   Patient Active Problem List   Diagnosis Date Noted  . Alzheimer's dementia [G30.9, F02.80] 06/27/2016  . Acute renal failure (ARF) ([place]) [N17.9] 06/26/2016  . Anemia [D64.9] 08/16/2015  . CKD (chronic kidney disease), stage II [N18.2] 08/16/2015  . Hyperlipidemia [E78.5] 08/16/2015  . Protein-calorie malnutrition, severe [E43] 08/15/2015  . Hypertension [I10] 03/12/2015  . Diabetes mellitus without complication ([place]) [B63.8] 03/12/2015    Total Time spent with patient: 45 minutes  Subjective:   Dana Riddle is a 82 y.o. female patient admitted with patient was not able to answer any questions at all. She did shake her head back and forth a few times but not in a way that I thought had any meaning.Marland Kitchen  HPI:  82 year old woman with a previously established diagnosis of Alzheimer's disease. Brought into the hospital with reports from her nursing home that she has had about a week of declining mental state. It's been documented by all providers since she came into the hospital that she is nonverbal. Doesn't answer any questions lucidly. Patient is apparently not eating currently. I was not able to get any history from her. Every question that I ask her she would either not respond at all or would slowly shake her head back and forth. This did not really appear though to be a true indication of trying to say "no". I ask her if she could not her head yes and she was not able to do that at all. When I ask her if her name was Dana Riddle she shook her head in the no gesture.  Social history: Resides at  a nursing home.    Past Psychiatric History: Patient has doses of Zoloft and Remeron already prescribed on admission. Doesn't have a clear psychiatric diagnosis and her past history. Not sure if those were provided for a specific concern about depression or possibly for sleep and appetite or somewhat more nonspecifically. It's been documented that she has Alzheimer's disease and that at baseline at most she is able to identify her name.  Risk to Self: Is patient at risk for suicide?: No Risk to Others:   Prior Inpatient Therapy:   Prior Outpatient Therapy:    Past Medical History:  Past Medical History:  Diagnosis Date  . Alzheimer's dementia   . Chronic kidney disease   . Diabetes mellitus without complication ([place])   . Hyperlipidemia   . Hypertension     Past Surgical History:  Procedure Laterality Date  . FEMUR IM NAIL Left 08/13/2015   Procedure: INTRAMEDULLARY (IM) NAIL FEMORAL;  Surgeon: Hessie Knows, MD;  Location: [facility] ORS;  Service: Orthopedics;  Laterality: Left;  . INTRAMEDULLARY (IM) NAIL INTERTROCHANTERIC Right 07/10/2015   Procedure: INTRAMEDULLARY (IM) NAIL INTERTROCHANTRIC;  Surgeon: Dereck Leep, MD;  Location: [facility] ORS;  Service: Orthopedics;  Laterality: Right;   Family History:  Family History  Problem Relation Age of Onset  . Cancer Father     lung   Family Psychiatric  History: Unknown Social History:  History  Alcohol Use No     History  Drug Use No    Social History   Social History  .  Marital status: Divorced    Spouse name: N/A  . Number of children: N/A  . Years of education: N/A   Social History Main Topics  . Smoking status: Current Every Day Smoker    Packs/day: 1.00  . Smokeless tobacco: Never Used  . Alcohol use No  . Drug use: No  . Sexual activity: Not Asked   Other Topics Concern  . None   Social History Narrative  . None   Additional Social History:    Allergies:  No Known Allergies  Labs:  Results for orders  placed or performed during the hospital encounter of 06/26/16 (from the past 48 hour(s))  Urinalysis complete, with microscopic ([facility] only)     Status: Abnormal   Collection Time: 06/26/16  5:30 PM  Result Value Ref Range   Color, Urine BROWN (A) YELLOW   APPearance TURBID (A) CLEAR   Glucose, UA (A) NEGATIVE mg/dL    TEST NOT REPORTED DUE TO COLOR INTERFERENCE OF URINE PIGMENT   Bilirubin Urine (A) NEGATIVE    TEST NOT REPORTED DUE TO COLOR INTERFERENCE OF URINE PIGMENT   Ketones, ur (A) NEGATIVE mg/dL    TEST NOT REPORTED DUE TO COLOR INTERFERENCE OF URINE PIGMENT   Specific Gravity, Urine 1.031 (H) 1.005 - 1.030   Hgb urine dipstick (A) NEGATIVE    TEST NOT REPORTED DUE TO COLOR INTERFERENCE OF URINE PIGMENT   pH  5.0 - 8.0    TEST NOT REPORTED DUE TO COLOR INTERFERENCE OF URINE PIGMENT   Protein, ur (A) NEGATIVE mg/dL    TEST NOT REPORTED DUE TO COLOR INTERFERENCE OF URINE PIGMENT   Nitrite (A) NEGATIVE    TEST NOT REPORTED DUE TO COLOR INTERFERENCE OF URINE PIGMENT   Leukocytes, UA (A) NEGATIVE    TEST NOT REPORTED DUE TO COLOR INTERFERENCE OF URINE PIGMENT   RBC / HPF TOO NUMEROUS TO COUNT 0 - 5 RBC/hpf   WBC, UA TOO NUMEROUS TO COUNT 0 - 5 WBC/hpf   Bacteria, UA MANY (A) NONE SEEN   Squamous Epithelial / LPF NONE SEEN NONE SEEN   WBC Clumps PRESENT    Mucous PRESENT   CBC with Differential     Status: Abnormal   Collection Time: 06/26/16  5:30 PM  Result Value Ref Range   WBC 14.4 (H) 3.6 - 11.0 K/uL   RBC 3.28 (L) 3.80 - 5.20 MIL/uL   Hemoglobin 9.6 (L) 12.0 - 16.0 g/dL   HCT 29.6 (L) 35.0 - 47.0 %   MCV 90.2 80.0 - 100.0 fL   MCH 29.1 26.0 - 34.0 pg   MCHC 32.3 32.0 - 36.0 g/dL   RDW 14.0 11.5 - 14.5 %   Platelets 504 (H) 150 - 440 K/uL   Neutrophils Relative % 75 %   Neutro Abs 10.9 (H) 1.4 - 6.5 K/uL   Lymphocytes Relative 17 %   Lymphs Abs 2.4 1.0 - 3.6 K/uL   Monocytes Relative 6 %   Monocytes Absolute 0.9 0.2 - 0.9 K/uL   Eosinophils Relative 1 %    Eosinophils Absolute 0.1 0 - 0.7 K/uL   Basophils Relative 1 %   Basophils Absolute 0.1 0 - 0.1 K/uL  Comprehensive metabolic panel     Status: Abnormal   Collection Time: 06/26/16  5:30 PM  Result Value Ref Range   Sodium 128 (L) 135 - 145 mmol/L   Potassium 5.5 (H) 3.5 - 5.1 mmol/L    Comment: HEMOLYSIS AT THIS LEVEL MAY AFFECT  RESULT   Chloride 99 (L) 101 - 111 mmol/L   CO2 19 (L) 22 - 32 mmol/L   Glucose, Bld 163 (H) 65 - 99 mg/dL   BUN 39 (H) 6 - 20 mg/dL   Creatinine, Ser 2.69 (H) 0.44 - 1.00 mg/dL   Calcium 8.2 (L) 8.9 - 10.3 mg/dL   Total Protein 7.1 6.5 - 8.1 g/dL   Albumin 2.5 (L) 3.5 - 5.0 g/dL   AST 16 15 - 41 U/L   ALT 7 (L) 14 - 54 U/L   Alkaline Phosphatase 93 38 - 126 U/L   Total Bilirubin 0.4 0.3 - 1.2 mg/dL   GFR calc non Af Amer 15 (L) >60 mL/min   GFR calc Af Amer 17 (L) >60 mL/min    Comment: (NOTE) The eGFR has been calculated using the CKD EPI equation. This calculation has not been validated in all clinical situations. eGFR's persistently <60 mL/min signify possible Chronic Kidney Disease.    Anion gap 10 5 - 15  Troponin I     Status: Abnormal   Collection Time: 06/26/16  5:30 PM  Result Value Ref Range   Troponin I 0.04 (HH) <0.03 ng/mL    Comment: CRITICAL RESULT CALLED TO, READ BACK BY AND VERIFIED WITH  DR. SCHAEVITZ AT 1929 06/26/16 SDR   TSH     Status: None   Collection Time: 06/26/16  5:30 PM  Result Value Ref Range   TSH 2.445 0.350 - 4.500 uIU/mL  Glucose, capillary     Status: Abnormal   Collection Time: 06/27/16 12:47 AM  Result Value Ref Range   Glucose-Capillary 104 (H) 65 - 99 mg/dL  MRSA PCR Screening     Status: Abnormal   Collection Time: 06/27/16  1:07 AM  Result Value Ref Range   MRSA by PCR POSITIVE (A) NEGATIVE    Comment:        The GeneXpert MRSA Assay (FDA approved for NASAL specimens only), is one component of a comprehensive MRSA colonization surveillance program. It is not intended to diagnose MRSA infection  nor to guide or monitor treatment for MRSA infections. RESULT CALLED TO, READ BACK BY AND VERIFIED WITH: STACY CLAY AT [place] 06/27/16.PMH   CBC     Status: Abnormal   Collection Time: 06/27/16  4:31 AM  Result Value Ref Range   WBC 12.1 (H) 3.6 - 11.0 K/uL   RBC 3.08 (L) 3.80 - 5.20 MIL/uL   Hemoglobin 9.2 (L) 12.0 - 16.0 g/dL   HCT 27.9 (L) 35.0 - 47.0 %   MCV 90.4 80.0 - 100.0 fL   MCH 29.9 26.0 - 34.0 pg   MCHC 33.1 32.0 - 36.0 g/dL   RDW 13.2 11.5 - 14.5 %   Platelets 413 150 - 440 K/uL  Basic metabolic panel     Status: Abnormal   Collection Time: 06/27/16  4:31 AM  Result Value Ref Range   Sodium 134 (L) 135 - 145 mmol/L   Potassium 4.5 3.5 - 5.1 mmol/L   Chloride 108 101 - 111 mmol/L   CO2 21 (L) 22 - 32 mmol/L   Glucose, Bld 104 (H) 65 - 99 mg/dL   BUN 35 (H) 6 - 20 mg/dL   Creatinine, Ser 2.36 (H) 0.44 - 1.00 mg/dL   Calcium 8.0 (L) 8.9 - 10.3 mg/dL   GFR calc non Af Amer 18 (L) >60 mL/min   GFR calc Af Amer 20 (L) >60 mL/min    Comment: (NOTE) The eGFR  has been calculated using the CKD EPI equation. This calculation has not been validated in all clinical situations. eGFR's persistently <60 mL/min signify possible Chronic Kidney Disease.    Anion gap 5 5 - 15  Troponin I     Status: Abnormal   Collection Time: 06/27/16  4:31 AM  Result Value Ref Range   Troponin I 0.03 (HH) <0.03 ng/mL    Comment: CRITICAL VALUE NOTED. VALUE IS CONSISTENT WITH PREVIOUSLY REPORTED/CALLED VALUE/HKP  Glucose, capillary     Status: None   Collection Time: 06/27/16  7:38 AM  Result Value Ref Range   Glucose-Capillary 98 65 - 99 mg/dL  Glucose, capillary     Status: Abnormal   Collection Time: 06/27/16 11:43 AM  Result Value Ref Range   Glucose-Capillary 128 (H) 65 - 99 mg/dL    Current Facility-Administered Medications  Medication Dose Route Frequency Provider Last Rate Last Dose  . 0.9 %  sodium chloride infusion   Intravenous Continuous Lytle Butte, MD 100 mL/hr at  06/27/16 1023 1,000 mL at 06/27/16 1023  . acetaminophen (TYLENOL) tablet 650 mg  650 mg Oral Q6H PRN Lytle Butte, MD       Or  . acetaminophen (TYLENOL) suppository 650 mg  650 mg Rectal Q6H PRN Lytle Butte, MD      . aspirin EC tablet 81 mg  81 mg Oral Daily Lytle Butte, MD   81 mg at 06/27/16 1023  . calcium carbonate (TUMS - dosed in mg elemental calcium) chewable tablet 200 mg of elemental calcium  1 tablet Oral BID Lytle Butte, MD   200 mg of elemental calcium at 06/27/16 1023  . ceFEPIme (MAXIPIME) 500 mg in dextrose 5 % 50 mL IVPB  500 mg Intravenous Q24H Merilyn Baba, RPH      . Chlorhexidine Gluconate Cloth 2 % PADS 6 each  6 each Topical Q0600 Alexis Hugelmeyer, DO   6 each at 06/27/16 0600  . dronabinol (MARINOL) capsule 2.5 mg  2.5 mg Oral BID AC Theodoro Grist, MD   2.5 mg at 06/27/16 1236  . feeding supplement (ENSURE ENLIVE) (ENSURE ENLIVE) liquid 237 mL  237 mL Oral BID BM Lytle Butte, MD   237 mL at 06/27/16 1023  . ferrous sulfate tablet 325 mg  325 mg Oral BID WC Lytle Butte, MD      . heparin injection 5,000 Units  5,000 Units Subcutaneous Q8H Lytle Butte, MD   5,000 Units at 06/27/16 1407  . insulin aspart (novoLOG) injection 0-5 Units  0-5 Units Subcutaneous QHS Lytle Butte, MD      . insulin aspart (novoLOG) injection 0-9 Units  0-9 Units Subcutaneous TID WC Lytle Butte, MD   1 Units at 06/27/16 1236  . mirtazapine (REMERON) tablet 15 mg  15 mg Oral QHS Lytle Butte, MD      . mupirocin ointment (BACTROBAN) 2 % 1 application  1 application Nasal BID Alexis Hugelmeyer, DO   1 application at 22/97/98 [phone]  . ondansetron (ZOFRAN) tablet 4 mg  4 mg Oral Q6H PRN Lytle Butte, MD       Or  . ondansetron [facility]) injection 4 mg  4 mg Intravenous Q6H PRN Lytle Butte, MD      . oxyCODONE (Oxy IR/ROXICODONE) immediate release tablet 5 mg  5 mg Oral Q4H PRN Lytle Butte, MD      . Derrill Memo ON 06/29/2016] senna (SENOKOT) tablet  8.6 mg  1 tablet Oral Once per day  on Mon Wed Fri Lytle Butte, MD      . sertraline (ZOLOFT) tablet 50 mg  50 mg Oral Daily Lytle Butte, MD   50 mg at 06/27/16 1023  . sodium chloride flush (NS) 0.9 % injection 3 mL  3 mL Intravenous Q12H Lytle Butte, MD   3 mL at 06/26/16 2246    Musculoskeletal: Strength & Muscle Tone: atrophy Gait & Station: unable to stand Patient leans: N/A  Psychiatric Specialty Exam: Physical Exam  Nursing note and vitals reviewed. Psychiatric: Her affect is blunt. She is withdrawn. She is noncommunicative.    Review of Systems  Unable to perform ROS: Patient nonverbal    Blood pressure 115/74, pulse 68, temperature 97.7 F (36.5 C), temperature source Oral, resp. rate 20, height 5' 3"  (1.6 m), weight 40.4 kg (89 lb 1.6 oz), SpO2 100 %.Body mass index is 15.78 kg/m.  General Appearance: Fairly Groomed  Eye Contact:  Fair  Speech:  Nonexistent  Volume:  Decreased  Mood:  Not stated  Affect:  Negative  Thought Process:  NA  Orientation:  Negative  Thought Content:  Negative  Suicidal Thoughts:  Not stated  Homicidal Thoughts:  Not stated  Memory:  Negative  Judgement:  Negative  Insight:  Negative  Psychomotor Activity:  Negative  Concentration:  Concentration: Negative  Recall:  Negative  Fund of Knowledge:  Negative  Language:  Negative  Akathisia:  Negative  Handed:  Right  AIMS (if indicated):     Assets:  Housing  ADL's:  Impaired  Cognition:  Impaired,  Severe  Sleep:        Treatment Plan Summary: Plan 82 year old woman with known dementia. Currently not eating and not speaking. Information is so limited I cannot even make a clear diagnosis of whether she is delirious. Acute infection can certainly worsen the presentation of dementia. No specific reason to suspect that this is depression. In any case I would not adjust the already present doses of antidepressants. I'm afraid I don't have anything at this point to clearly add. Has the infection is treated she may  recover back to a baseline that will include eating. I would strongly suggest that any family be involved with palliative care to discuss whether or not more aggressive feeding is appropriate at this point.  Disposition: Patient does not meet criteria for psychiatric inpatient admission.  Alethia Berthold, MD 06/27/2016 3:24 PM

## 2016-06-28 ENCOUNTER — Inpatient Hospital Stay: Payer: Medicare Other

## 2016-06-28 LAB — GLUCOSE, CAPILLARY
GLUCOSE-CAPILLARY: 105 mg/dL — AB (ref 65–99)
GLUCOSE-CAPILLARY: 96 mg/dL (ref 65–99)
GLUCOSE-CAPILLARY: 116 mg/dL — AB (ref 65–99)
Glucose-Capillary: 156 mg/dL — ABNORMAL HIGH (ref 65–99)

## 2016-06-28 LAB — BASIC METABOLIC PANEL WITH GFR
Anion gap: 5 (ref 5–15)
BUN: 31 mg/dL — ABNORMAL HIGH (ref 6–20)
CO2: 19 mmol/L — ABNORMAL LOW (ref 22–32)
Calcium: 7.6 mg/dL — ABNORMAL LOW (ref 8.9–10.3)
Chloride: 110 mmol/L (ref 101–111)
Creatinine, Ser: 2.2 mg/dL — ABNORMAL HIGH (ref 0.44–1.00)
GFR calc Af Amer: 22 mL/min — ABNORMAL LOW
GFR calc non Af Amer: 19 mL/min — ABNORMAL LOW
Glucose, Bld: 111 mg/dL — ABNORMAL HIGH (ref 65–99)
Potassium: 4.6 mmol/L (ref 3.5–5.1)
Sodium: 134 mmol/L — ABNORMAL LOW (ref 135–145)

## 2016-06-28 LAB — ECHOCARDIOGRAM COMPLETE
Height: 63 in
Weight: 1425.6 [oz_av]

## 2016-06-28 LAB — CBC
HCT: 24.6 % — ABNORMAL LOW (ref 35.0–47.0)
Hemoglobin: 8.1 g/dL — ABNORMAL LOW (ref 12.0–16.0)
MCH: 29.6 pg (ref 26.0–34.0)
MCHC: 33.1 g/dL (ref 32.0–36.0)
MCV: 89.4 fL (ref 80.0–100.0)
Platelets: 365 10*3/uL (ref 150–440)
RBC: 2.75 MIL/uL — ABNORMAL LOW (ref 3.80–5.20)
RDW: 13.6 % (ref 11.5–14.5)
WBC: 10.5 10*3/uL (ref 3.6–11.0)

## 2016-06-28 NOTE — Progress Notes (Signed)
[facility] - [place] at [facility]   PATIENT NAME: Dana Riddle    MR#:  [id number]  DATE OF BIRTH:  1928-10-09  SUBJECTIVE:  CHIEF COMPLAINT:   Chief Complaint  Patient presents with  . Altered Mental Status  . Recurrent UTI   The patient is 82 year old female with asthma, history significant for history of Alzheimer's dementia, CK D, ABDs mellitus, hypertension, hyperlipidemia, who presents to the hospital with altered mental status, acute and chronic renal failure. Patient is not able to provide review of systems, she refuses to eat, Take Medications. She was seen by Dr. Toni Amendlapacs, psychiatrist, who did not recommend any further therapies or investigations, but palliative care intervention and discussion with family. Patient is agitated intermittently and pushes nurse is awake Review of Systems  Unable to perform ROS: Patient nonverbal    VITAL SIGNS: Blood pressure (!) 105/43, pulse 66, temperature 97.7 F (36.5 C), temperature source Axillary, resp. rate 17, height 5\' 3"  (1.6 m), weight 40.4 kg (89 lb 1.6 oz), SpO2 99 %.  PHYSICAL EXAMINATION:   GENERAL:  82 y.o.-year-old patient lying in the bed with no acute distress, intermittently agitated, tries to hit nursing staff  EYES: Pupils equal, round, reactive to light and accommodation. No scleral icterus. Extraocular muscles intact.  HEENT: Head atraumatic, normocephalic. Oropharynx and nasopharynx clear.  NECK:  Supple, no jugular venous distention. No thyroid enlargement, no tenderness.  LUNGS: Normal breath sounds bilaterally, no wheezing, rales,rhonchi or crepitation. No use of accessory muscles of respiration.  CARDIOVASCULAR: S1, S2 normal. No murmurs, rubs, or gallops.  ABDOMEN: Soft, nontender, nondistended. Bowel sounds present. No organomegaly or mass.  EXTREMITIES: No pedal edema, cyanosis, or clubbing.  NEUROLOGIC: Cranial nerves II through XII are intact. Muscle strength 5/5 in all  extremities. Sensation intact. Gait not checked.  PSYCHIATRIC: The patient is sleepy, unable to assess orientation, agitated  SKIN: No obvious rash, lesion, or ulcer.   ORDERS/RESULTS REVIEWED:   CBC  Recent Labs Lab 06/26/16 1730 06/27/16 0431 06/28/16 0540  WBC 14.4* 12.1* 10.5  HGB 9.6* 9.2* 8.1*  HCT 29.6* 27.9* 24.6*  PLT 504* 413 365  MCV 90.2 90.4 89.4  MCH 29.1 29.9 29.6  MCHC 32.3 33.1 33.1  RDW 14.0 13.2 13.6  LYMPHSABS 2.4  --   --   MONOABS 0.9  --   --   EOSABS 0.1  --   --   BASOSABS 0.1  --   --    ------------------------------------------------------------------------------------------------------------------  Chemistries   Recent Labs Lab 06/26/16 1730 06/27/16 0431 06/28/16 0540  NA 128* 134* 134*  K 5.5* 4.5 4.6  CL 99* 108 110  CO2 19* 21* 19*  GLUCOSE 163* 104* 111*  BUN 39* 35* 31*  CREATININE 2.69* 2.36* 2.20*  CALCIUM 8.2* 8.0* 7.6*  AST 16  --   --   ALT 7*  --   --   ALKPHOS 93  --   --   BILITOT 0.4  --   --    ------------------------------------------------------------------------------------------------------------------ estimated creatinine clearance is 11.9 mL/min (by C-G formula based on SCr of 2.2 mg/dL (H)). ------------------------------------------------------------------------------------------------------------------  Recent Labs  06/26/16 1730  TSH 2.445    Cardiac Enzymes  Recent Labs Lab 06/26/16 1730 06/27/16 0431  TROPONINI 0.04* 0.03*   ------------------------------------------------------------------------------------------------------------------ Invalid input(s): POCBNP ---------------------------------------------------------------------------------------------------------------  RADIOLOGY: Dg Chest 2 View  Result Date: 06/26/2016 CLINICAL DATA:  Lethargy and hematuria for 1 week, altered mental status. Left lower field rales. EXAM: CHEST  2 VIEW  COMPARISON:  Chest x-rays dated 08/11/2015 and  07/11/2015. FINDINGS: Heart size and mediastinal contours are normal. Atherosclerotic changes again noted at the aortic arch. Mildly prominent interstitial lung markings again noted suggesting chronic interstitial lung disease. No new lung findings. No evidence of pneumonia. No pleural effusion no pneumothorax seen. Mild degenerative spurring again noted within the slightly kyphotic thoracic spine. Probable mild compression fracture deformities noted in the upper and mid thoracic spine, difficult to characterize due to the patient's overall osteopenia, likely chronic given the stable kyphosis. IMPRESSION: 1. No acute findings.  No evidence of pneumonia. 2. Suspected chronic interstitial lung disease. 3. Aortic atherosclerosis. Electronically Signed   By: Bary Richard M.D.   On: 06/26/2016 18:03   Ct Head Wo Contrast  Result Date: 06/26/2016 CLINICAL DATA:  Altered mental status. EXAM: CT HEAD WITHOUT CONTRAST TECHNIQUE: Contiguous axial images were obtained from the base of the skull through the vertex without intravenous contrast. COMPARISON:  CT scan of July 09, 2015. FINDINGS: Brain: Mild diffuse cortical atrophy is noted. Mild chronic ischemic white matter disease is noted. No mass effect or midline shift is noted. Ventricular size is within normal limits. There is no evidence of mass lesion, hemorrhage or acute infarction. Vascular: Atherosclerotic calcifications of internal carotid arteries is noted. Skull: Bony calvarium appears intact. Sinuses/Orbits: Mild ethmoid and left sphenoid sinusitis is noted. Other: None. IMPRESSION: Mild diffuse cortical atrophy. Mild chronic ischemic white matter disease. No acute intracranial abnormality seen Electronically Signed   By: Lupita Raider, M.D.   On: 06/26/2016 17:51   US Renal  Result Date: 06/27/2016 CLINICAL DATA:  Acute renal failure EXAM: RENAL / URINARY TRACT ULTRASOUND COMPLETE COMPARISON:  None. FINDINGS: Right Kidney: Length: 11.9 cm. There is  marked hydronephrosis on the right. The right renal pelvis and ureter are also dilated. Debris and stones are seen within the dilated renal collecting system. Renal stones are noted. The largest single stone measures approximately 13 mm. Left Kidney: Length: 11.3 cm. Moderate to severe hydronephrosis with internal echoes within the dilated renal collecting system. Bladder: The bladder is abnormal containing a masslike region measuring 7.2 x 6.4 x 4.5 cm. However, on several images, there appears to be a fluid level. Blood flow was not excluded in this region. However, there is falling debris within the bladder seen on cine images. IMPRESSION: 1. Bilateral hydronephrosis and ureterectasis. Numerous stones in the right kidney and right renal collecting system. Probable small stones in the left renal collecting system. 2. Masslike material within the bladder. On several images, there appears to be a fluid level suggesting this may represent tumefactive debris and the possible color on Doppler imaging could be due to falling debris. That being said, a bladder mass is not excluded on this study alone. These results will be called to the ordering clinician or representative by the Radiologist Assistant, and communication documented in the PACS or zVision Dashboard. Electronically Signed   By: Gerome Sam III M.D   On: 06/27/2016 10:30    EKG:  Orders placed or performed during the hospital encounter of 06/26/16  . ED EKG  . ED EKG  . EKG 12-Lead  . EKG 12-Lead    ASSESSMENT AND PLAN:  Principal Problem:   Alzheimer's dementia Active Problems:   Acute renal failure (ARF) (HCC)  #1. Metabolic encephalopathy , likely delirium due to urinary tract infection, cannot rule out underlying organic disease, Appreciate psychiatrist input, no advancement of medications were recommended, more over, patient is not  taking her usual home medications anyway,  continue supportive therapy, IV fluids. Getting palliative  care to discuss with family further treatment goals, unable to reach family yesterday.  #2. Acute on chronic renal failure, improved with IV fluid administration yesterday, no BMP today,  follow creatinine in the morning #3. Hyperkalemia, resolved with IV fluid administration #4. Elevated troponin, likely demand ischemia, echocardiogram revealed moderate LVH, normal ejection fraction at 75% to further interventions are recommended. Heart rate is in 60s #5. Leukocytosis, likely due to urinary tract infection, resolved with antibiotic therapy #6. Urinary tract infection, continue cefepime, urine cultures are still pending   Management plans discussed with the patient, family and they are in agreement.   DRUG ALLERGIES: No Known Allergies  CODE STATUS:     Code Status Orders        Start     Ordered   06/26/16 2012  Full code  Continuous     06/26/16 2012    Code Status History    Date Active Date Inactive Code Status Order ID Comments User Context   08/13/2015  6:36 PM 08/16/2015 12:48 PM Full Code [id number]  Kennedy Bucker, MD Inpatient   08/11/2015  4:04 PM 08/13/2015  6:36 PM Full Code [id number]  Shaune Pollack, MD Inpatient   07/10/2015 10:13 PM 07/13/2015  4:49 PM Full Code [id number]  Donato Heinz, MD Inpatient   07/09/2015  8:33 PM 07/10/2015 10:13 PM Full Code [id number]  Donato Heinz, MD Inpatient   07/09/2015  4:07 PM 07/09/2015  8:33 PM Full Code [id number]  Alford Highland, MD ED      TOTAL TIME TAKING CARE OF THIS PATIENT: 40 minutes.    Katharina Caper M.D on 06/28/2016 at 11:47 AM  Between 7am to 6pm - Pager - [phone]  After 6pm go to [email or web address] - password [email or web address]  [facility]  Office  [phone]  CC: Primary care physician; Vonita Moss, MD

## 2016-06-28 NOTE — Consult Note (Signed)
[facility] CONSULT NOTE    Date: 06/28/2016                  Patient Name:  Dana Riddle  MRN: [id number]  DOB: Oct 11, 1928  Age / Sex: 82 y.o., female         PCP: Vonita Moss, MD                 Service Requesting Consult: Hospitalist                 Reason for Consult: Acute renal failure            History of Present Illness: Patient is a 82 y.o. female with a PMHx of Severe Alzheimer's dementia, diabetes mellitus, hypertension, hyperlipidemia, who was admitted to [facility] on 06/26/2016 for evaluation of altered mental status. We are asked to see her for evaluation management of acute renal failure.  The patient's baseline creatinine appears to be 0.7 from 08/15/2015. When she presented now creatinine was 2.69. It has come down to 2.2 with IV fluid hydration.  However patient had a renal ultrasound which revealed bilateral hydronephrosis and ureterectasis. There were numerous stones in the right kidney and right renal collecting system. In addition there was a masslike material within the bladder. Patient currently unable to provide any history secondary to her dementia.   Medications: Outpatient medications: Prescriptions Prior to Admission  Medication Sig Dispense Refill Last Dose  . acetaminophen (TYLENOL) 500 MG tablet Take 500 mg by mouth every 6 (six) hours as needed for fever or headache.   prn at prn  . alendronate (FOSAMAX) 35 MG tablet Take 35 mg by mouth every 7 (seven) days. Take with a full glass of water on an empty stomach. Wednesdays   06/24/2016 at Unknown time  . aspirin EC 81 MG tablet Take 81 mg by mouth daily.   06/26/2016 at 0800  . calcium carbonate (TUMS - DOSED IN MG ELEMENTAL CALCIUM) 500 MG chewable tablet Chew 1 tablet by mouth 2 (two) times daily.   06/26/2016 at 0800  . feeding supplement, ENSURE ENLIVE, (ENSURE ENLIVE) LIQD Take 237 mLs by mouth 2 (two) times daily between meals. 237 mL 12 Past Week at 1700  . ferrous sulfate 325  (65 FE) MG tablet Take 1 tablet (325 mg total) by mouth 2 (two) times daily with a meal. 30 tablet 0 06/26/2016 at 0800  . insulin aspart (NOVOLOG) 100 UNIT/ML injection Inject into the skin. Sliding scale 200-249 4 units 250-299 6 units 300-349 8 units 350-399 10 units   Past Week at Unknown time  . mirtazapine (REMERON) 7.5 MG tablet Take 15 mg by mouth at bedtime.    06/25/2016 at 2000  . senna (SENOKOT) 8.6 MG TABS tablet Take 1 tablet by mouth 3 (three) times a week. Monday Wednesday Friday   Past Week at Unknown time  . sertraline (ZOLOFT) 50 MG tablet Take 50 mg by mouth daily.   06/25/2016 at 2000  . sitaGLIPtin (JANUVIA) 25 MG tablet Take 25 mg by mouth daily.   06/26/2016 at 0800    Current medications: Current Facility-Administered Medications  Medication Dose Route Frequency Provider Last Rate Last Dose  . 0.9 %  sodium chloride infusion   Intravenous Continuous Wyatt Haste, MD 100 mL/hr at 06/28/16 0608    . acetaminophen (TYLENOL) tablet 650 mg  650 mg Oral Q6H PRN Wyatt Haste, MD       Or  .  acetaminophen (TYLENOL) suppository 650 mg  650 mg Rectal Q6H PRN Wyatt Hasteavid K Hower, MD      . aspirin EC tablet 81 mg  81 mg Oral Daily Wyatt Hasteavid K Hower, MD   81 mg at 06/28/16 1046  . calcium carbonate (TUMS - dosed in mg elemental calcium) chewable tablet 200 mg of elemental calcium  1 tablet Oral BID Wyatt Hasteavid K Hower, MD   200 mg of elemental calcium at 06/28/16 1046  . ceFEPIme (MAXIPIME) 500 mg in dextrose 5 % 50 mL IVPB  500 mg Intravenous Q24H Rolm BaptiseHolly N Gilliam, RPH   500 mg at 06/27/16 1703  . Chlorhexidine Gluconate Cloth 2 % PADS 6 each  6 each Topical Q0600 Alexis Hugelmeyer, DO   6 each at 06/28/16 [id number]  . dronabinol (MARINOL) capsule 2.5 mg  2.5 mg Oral BID AC Katharina Caperima Vaickute, MD   2.5 mg at 06/28/16 1218  . feeding supplement (ENSURE ENLIVE) (ENSURE ENLIVE) liquid 237 mL  237 mL Oral BID BM Wyatt Hasteavid K Hower, MD   237 mL at 06/27/16 1558  . ferrous sulfate tablet 325 mg  325 mg Oral BID WC  Wyatt Hasteavid K Hower, MD   325 mg at 06/28/16 1046  . heparin injection 5,000 Units  5,000 Units Subcutaneous Q8H Wyatt Hasteavid K Hower, MD   5,000 Units at 06/28/16 0605  . insulin aspart (novoLOG) injection 0-5 Units  0-5 Units Subcutaneous QHS Wyatt Hasteavid K Hower, MD      . insulin aspart (novoLOG) injection 0-9 Units  0-9 Units Subcutaneous TID WC Wyatt Hasteavid K Hower, MD   5 Units at 06/27/16 1658  . mirtazapine (REMERON) tablet 15 mg  15 mg Oral QHS Wyatt Hasteavid K Hower, MD      . mupirocin ointment (BACTROBAN) 2 % 1 application  1 application Nasal BID Alexis Hugelmeyer, DO   1 application at 06/28/16 1046  . ondansetron (ZOFRAN) tablet 4 mg  4 mg Oral Q6H PRN Wyatt Hasteavid K Hower, MD       Or  . ondansetron [facility](ZOFRAN) injection 4 mg  4 mg Intravenous Q6H PRN Wyatt Hasteavid K Hower, MD      . oxyCODONE (Oxy IR/ROXICODONE) immediate release tablet 5 mg  5 mg Oral Q4H PRN Wyatt Hasteavid K Hower, MD      . Melene Muller[START ON 06/29/2016] senna (SENOKOT) tablet 8.6 mg  1 tablet Oral Once per day on Mon Wed Fri Wyatt Hasteavid K Hower, MD      . sertraline (ZOLOFT) tablet 50 mg  50 mg Oral Daily Wyatt Hasteavid K Hower, MD   50 mg at 06/28/16 1046  . sodium chloride flush (NS) 0.9 % injection 3 mL  3 mL Intravenous Q12H Wyatt Hasteavid K Hower, MD   3 mL at 06/26/16 2246      Allergies: No Known Allergies    Past Medical History: Past Medical History:  Diagnosis Date  . Alzheimer's dementia   . Chronic kidney disease   . Diabetes mellitus without complication (HCC)   . Hyperlipidemia   . Hypertension      Past Surgical History: Past Surgical History:  Procedure Laterality Date  . FEMUR IM NAIL Left 08/13/2015   Procedure: INTRAMEDULLARY (IM) NAIL FEMORAL;  Surgeon: Kennedy BuckerMichael Menz, MD;  Location: [facility] ORS;  Service: Orthopedics;  Laterality: Left;  . INTRAMEDULLARY (IM) NAIL INTERTROCHANTERIC Right 07/10/2015   Procedure: INTRAMEDULLARY (IM) NAIL INTERTROCHANTRIC;  Surgeon: Donato HeinzJames P Hooten, MD;  Location: [facility] ORS;  Service: Orthopedics;  Laterality: Right;     Family  History: Family History  Problem Relation Age of Onset  . Cancer Father     lung     Social History: Social History   Social History  . Marital status: Divorced    Spouse name: N/A  . Number of children: N/A  . Years of education: N/A   Occupational History  . Not on file.   Social History Main Topics  . Smoking status: Current Every Day Smoker    Packs/day: 1.00  . Smokeless tobacco: Never Used  . Alcohol use No  . Drug use: No  . Sexual activity: Not on file   Other Topics Concern  . Not on file   Social History Narrative  . No narrative on file     Review of Systems: Patient unable to provide any review of systems.    Vital Signs: Blood pressure (!) 108/46, pulse 64, temperature 98 F (36.7 C), temperature source Oral, resp. rate 16, height 5\' 3"  (1.6 m), weight 40.4 kg (89 lb 1.6 oz), SpO2 98 %.  Weight trends: Filed Weights   06/26/16 1731 06/27/16 0028  Weight: 40.9 kg (90 lb 2.7 oz) 40.4 kg (89 lb 1.6 oz)    Physical Exam: General: Frail appearing, resting in bed.  Head: Normocephalic, atraumatic.  Eyes: Anicteric, EOMI  Nose: Mucous membranes moist, not inflammed, nonerythematous.  Throat: Oropharynx nonerythematous, no exudate appreciated.   Neck: Supple, trachea midline.  Lungs:  Normal respiratory effort. Clear to auscultation BL without crackles or wheezes.  Heart: RRR. S1 and S2 normal without gallop, murmur, or rubs.  Abdomen:  BS normoactive. Soft, Nondistended, non-tender.    Extremities: No pretibial edema.  Neurologic: Awake, but not following any commands  Skin: No visible rashes, scars.    Lab results: Basic Metabolic Panel:  Recent Labs Lab 06/26/16 1730 06/27/16 0431 06/28/16 0540  NA 128* 134* 134*  K 5.5* 4.5 4.6  CL 99* 108 110  CO2 19* 21* 19*  GLUCOSE 163* 104* 111*  BUN 39* 35* 31*  CREATININE 2.69* 2.36* 2.20*  CALCIUM 8.2* 8.0* 7.6*    Liver Function Tests:  Recent Labs Lab 06/26/16 1730  AST 16  ALT  7*  ALKPHOS 93  BILITOT 0.4  PROT 7.1  ALBUMIN 2.5*   No results for input(s): LIPASE, AMYLASE in the last 168 hours. No results for input(s): AMMONIA in the last 168 hours.  CBC:  Recent Labs Lab 06/26/16 1730 06/27/16 0431 06/28/16 0540  WBC 14.4* 12.1* 10.5  NEUTROABS 10.9*  --   --   HGB 9.6* 9.2* 8.1*  HCT 29.6* 27.9* 24.6*  MCV 90.2 90.4 89.4  PLT 504* 413 365    Cardiac Enzymes:  Recent Labs Lab 06/26/16 1730 06/27/16 0431  TROPONINI 0.04* 0.03*    BNP: Invalid input(s): POCBNP  CBG:  Recent Labs Lab 06/27/16 1143 06/27/16 1625 06/27/16 2208 06/28/16 0726 06/28/16 1203  GLUCAP 128* 269* 107* 105* 96    Microbiology: Results for orders placed or performed during the hospital encounter of 06/26/16  Urine culture     Status: Abnormal (Preliminary result)   Collection Time: 06/26/16  5:30 PM  Result Value Ref Range Status   Specimen Description URINE, CATHETERIZED  Final   Special Requests NONE  Final   Culture >=100,000 COLONIES/mL ENTEROCOCCUS FAECALIS (A)  Final   Report Status PENDING  Incomplete  MRSA PCR Screening     Status: Abnormal   Collection Time: 06/27/16  1:07 AM  Result Value Ref Range Status   MRSA by PCR  POSITIVE (A) NEGATIVE Final    Comment:        The GeneXpert MRSA Assay (FDA approved for NASAL specimens only), is one component of a comprehensive MRSA colonization surveillance program. It is not intended to diagnose MRSA infection nor to guide or monitor treatment for MRSA infections. RESULT CALLED TO, READ BACK BY AND VERIFIED WITH: STACY CLAY AT 0256 06/27/16.PMH     Coagulation Studies: No results for input(s): LABPROT, INR in the last 72 hours.  Urinalysis:  Recent Labs  06/26/16 1730  COLORURINE BROWN*  LABSPEC 1.031*  PHURINE TEST NOT REPORTED DUE TO COLOR INTERFERENCE OF URINE PIGMENT  GLUCOSEU TEST NOT REPORTED DUE TO COLOR INTERFERENCE OF URINE PIGMENT*  HGBUR TEST NOT REPORTED DUE TO COLOR  INTERFERENCE OF URINE PIGMENT*  BILIRUBINUR TEST NOT REPORTED DUE TO COLOR INTERFERENCE OF URINE PIGMENT*  KETONESUR TEST NOT REPORTED DUE TO COLOR INTERFERENCE OF URINE PIGMENT*  PROTEINUR TEST NOT REPORTED DUE TO COLOR INTERFERENCE OF URINE PIGMENT*  NITRITE TEST NOT REPORTED DUE TO COLOR INTERFERENCE OF URINE PIGMENT*  LEUKOCYTESUR TEST NOT REPORTED DUE TO COLOR INTERFERENCE OF URINE PIGMENT*      Imaging: Dg Chest 2 View  Result Date: 06/26/2016 CLINICAL DATA:  Lethargy and hematuria for 1 week, altered mental status. Left lower field rales. EXAM: CHEST  2 VIEW COMPARISON:  Chest x-rays dated 08/11/2015 and 07/11/2015. FINDINGS: Heart size and mediastinal contours are normal. Atherosclerotic changes again noted at the aortic arch. Mildly prominent interstitial lung markings again noted suggesting chronic interstitial lung disease. No new lung findings. No evidence of pneumonia. No pleural effusion no pneumothorax seen. Mild degenerative spurring again noted within the slightly kyphotic thoracic spine. Probable mild compression fracture deformities noted in the upper and mid thoracic spine, difficult to characterize due to the patient's overall osteopenia, likely chronic given the stable kyphosis. IMPRESSION: 1. No acute findings.  No evidence of pneumonia. 2. Suspected chronic interstitial lung disease. 3. Aortic atherosclerosis. Electronically Signed   By: Bary Richard M.D.   On: 06/26/2016 18:03   Ct Head Wo Contrast  Result Date: 06/26/2016 CLINICAL DATA:  Altered mental status. EXAM: CT HEAD WITHOUT CONTRAST TECHNIQUE: Contiguous axial images were obtained from the base of the skull through the vertex without intravenous contrast. COMPARISON:  CT scan of July 09, 2015. FINDINGS: Brain: Mild diffuse cortical atrophy is noted. Mild chronic ischemic white matter disease is noted. No mass effect or midline shift is noted. Ventricular size is within normal limits. There is no evidence of mass  lesion, hemorrhage or acute infarction. Vascular: Atherosclerotic calcifications of internal carotid arteries is noted. Skull: Bony calvarium appears intact. Sinuses/Orbits: Mild ethmoid and left sphenoid sinusitis is noted. Other: None. IMPRESSION: Mild diffuse cortical atrophy. Mild chronic ischemic white matter disease. No acute intracranial abnormality seen Electronically Signed   By: Lupita Raider, M.D.   On: 06/26/2016 17:51   US Renal  Result Date: 06/27/2016 CLINICAL DATA:  Acute renal failure EXAM: RENAL / URINARY TRACT ULTRASOUND COMPLETE COMPARISON:  None. FINDINGS: Right Kidney: Length: 11.9 cm. There is marked hydronephrosis on the right. The right renal pelvis and ureter are also dilated. Debris and stones are seen within the dilated renal collecting system. Renal stones are noted. The largest single stone measures approximately 13 mm. Left Kidney: Length: 11.3 cm. Moderate to severe hydronephrosis with internal echoes within the dilated renal collecting system. Bladder: The bladder is abnormal containing a masslike region measuring 7.2 x 6.4 x 4.5 cm. However,  on several images, there appears to be a fluid level. Blood flow was not excluded in this region. However, there is falling debris within the bladder seen on cine images. IMPRESSION: 1. Bilateral hydronephrosis and ureterectasis. Numerous stones in the right kidney and right renal collecting system. Probable small stones in the left renal collecting system. 2. Masslike material within the bladder. On several images, there appears to be a fluid level suggesting this may represent tumefactive debris and the possible color on Doppler imaging could be due to falling debris. That being said, a bladder mass is not excluded on this study alone. These results will be called to the ordering clinician or representative by the Radiologist Assistant, and communication documented in the PACS or zVision Dashboard. Electronically Signed   By: Gerome Sam III M.D   On: 06/27/2016 10:30      Assessment & Plan: Pt is a 82 y.o. female with a PMHx of Severe Alzheimer's dementia, diabetes mellitus, hypertension, hyperlipidemia, who was admitted to [facility] on 06/26/2016 for evaluation of altered mental status.   1.  Acute renal failure likely related to #2.  2.  Bilateral hydronephrosis with nephrolithiasis. 3.  Masslike material within the bladder.  4.  Altered mental status in the setting of underlying dementia.  5.  Hyponatremia. 6.  Anemia unspecified.   Plan:  The patient presents with acute renal failure in the setting of bilateral hydronephrosis with nephrolithiasis. The patient's creatinine has started to come down with conservative management. She has severe underlying dementia. In addition to this she has masslike material within the bladder. She is apparently a ward of the state. Case was discussed in depth with Dr. Winona Legato.  Prior to initiation of more aggressive care goals of care will need to be identified given her severe underlying dementia. We will attempt to delineate the urologic issues with a CT scan of the abdomen and pelvis without contrast. Given the fact that creatinine is trending down nor urgent indication for urology involvement especially until goals of care can be identified as above. We will continue to monitor the patient's progress along with you closely. For now continue IV fluid hydration with 0.9 normal saline.  Thanks for the interesting and complex consultation.

## 2016-06-28 NOTE — Care Management Important Message (Signed)
Important Message  Patient Details  Name: Dana HuddleJosephine E Aikey MRN: [id number] Date of Birth: 05-13-1929   Medicare Important Message Given:  Yes    Dovie Kapusta A, RN 06/28/2016, 4:05 PM

## 2016-06-28 NOTE — NC FL2 (Signed)
[place] MEDICAID FL2 LEVEL OF CARE SCREENING TOOL     IDENTIFICATION  Patient Name: Dana Riddle Birthdate: 1929-04-22 Sex: female Admission Date (Current Location): 06/26/2016  [place] Number:  Chiropodist and Address:  [facility], [address], [place], [place] [address]      Provider Number: [id number]  Attending Physician Name and Address:  Katharina Caper, MD  Relative Name and Phone Number:       Current Level of Care: Hospital Recommended Level of Care: Assisted Living Facility Prior Approval Number:    Date Approved/Denied: 09/11/15 PASRR Number: [id number]  Discharge Plan:      Current Diagnoses: Patient Active Problem List   Diagnosis Date Noted  . Alzheimer's dementia 06/27/2016  . Acute renal failure (ARF) (HCC) 06/26/2016  . Anemia 08/16/2015  . CKD (chronic kidney disease), stage II 08/16/2015  . Hyperlipidemia 08/16/2015  . Protein-calorie malnutrition, severe 08/15/2015  . Hypertension 03/12/2015  . Diabetes mellitus without complication (HCC) 03/12/2015    Orientation RESPIRATION BLADDER Height & Weight     Self  Normal Incontinent Weight: 89 lb 1.6 oz (40.4 kg) Height:  5\' 3"  (160 cm)  BEHAVIORAL SYMPTOMS/MOOD NEUROLOGICAL BOWEL NUTRITION STATUS      Incontinent    AMBULATORY STATUS COMMUNICATION OF NEEDS Skin   Supervision Non-Verbally Normal                       Personal Care Assistance Level of Assistance  Bathing, Dressing Bathing Assistance: Maximum assistance   Dressing Assistance: Maximum assistance     Functional Limitations Info  Speech     Speech Info: Impaired    SPECIAL CARE FACTORS FREQUENCY                       Contractures Contractures Info: Present    Additional Factors Info                  DISCHARGE MEDICATIONS:       Current Discharge Medication List        START taking these medications   Details  dronabinol (MARINOL)  2.5 MG capsule Take 1 capsule (2.5 mg total) by mouth 2 (two) times daily before lunch and supper. Qty: 20 capsule, Refills: 0    oxyCODONE (OXY IR/ROXICODONE) 5 MG immediate release tablet Take 1 tablet (5 mg total) by mouth every 4 (four) hours as needed for moderate pain. Qty: 30 tablet, Refills: 0          CONTINUE these medications which have NOT CHANGED   Details  acetaminophen (TYLENOL) 500 MG tablet Take 500 mg by mouth every 6 (six) hours as needed for fever or headache.    feeding supplement, ENSURE ENLIVE, (ENSURE ENLIVE) LIQD Take 237 mLs by mouth 2 (two) times daily between meals. Qty: 237 mL, Refills: 12    insulin aspart (NOVOLOG) 100 UNIT/ML injection Inject into the skin. Sliding scale 200-249 4 units 250-299 6 units 300-349 8 units 350-399 10 units    mirtazapine (REMERON) 7.5 MG tablet Take 15 mg by mouth at bedtime.     senna (SENOKOT) 8.6 MG TABS tablet Take 1 tablet by mouth 3 (three) times a week. Monday Wednesday Friday    sertraline (ZOLOFT) 50 MG tablet Take 50 mg by mouth daily.         STOP taking these medications     alendronate (FOSAMAX) 35 MG tablet  aspirin EC 81 MG tablet      calcium carbonate (TUMS - DOSED IN MG ELEMENTAL CALCIUM) 500 MG chewable tablet      ferrous sulfate 325 (65 FE) MG tablet      sitaGLIPtin (JANUVIA) 25 MG tablet          Discharge Medications: Please see discharge summary for a list of discharge medications.  Relevant Imaging Results:  Relevant Lab Results:   Additional Information  SS #[id number]  Judi CongKaren M White, LCSW

## 2016-06-28 NOTE — Clinical Social Work Note (Signed)
Clinical Social Work Assessment  Patient Details  Name: Dana Riddle MRN: [id number] Date of Birth: 08/22/1929  Date of referral:  06/28/16               Reason for consult:  Facility Placement                Permission sought to share information with:  Facility Industrial/product designerContact Representative Permission granted to share information::     Name::        Agency::     Relationship::     Contact Information:     Housing/Transportation Living arrangements for the past 2 months:  Assisted Living Facility Source of Information:  Facility Patient Interpreter Needed:  None Criminal Activity/Legal Involvement Pertinent to Current Situation/Hospitalization:  No - Comment as needed Significant Relationships:  None Lives with:  Facility Resident Do you feel safe going back to the place where you live?  Yes Need for family participation in patient care:  Yes (Comment)  Care giving concerns:  Patient admitted from facility   Social Worker assessment / plan:  The patient is baseline semi-verbal and has advanced Alzheimer's disease. The patient is followed by psych due to related dementia. All information gleaned from facility and chart notes.  The patient resides at [facility]. In the past week, the attendants found blood in her urine, and they noted that the patient was sleeping most of the day and not eating or drinking. They transported her to [facility] ED for suspected UTI.  The facility will accept her once she is deemed stable. Her contact is the facility owner, Ardyth GalQueen Postell. The facility indicated that she has no family who visits or contacts the facility. The patient has a palliative care consult pending, and she is showing continued decline.   Employment status:  Retired Health and safety inspectornsurance information:  Medicare PT Recommendations:  Not assessed at this time Information / Referral to community resources:     Patient/Family's Response to care:  The patient is non-verbal.  Patient/Family's  Understanding of and Emotional Response to Diagnosis, Current Treatment, and Prognosis: The facility representative was able to relay information.  Emotional Assessment Appearance:  Appears stated age Attitude/Demeanor/Rapport:   (Patient is non-verbal for the most part) Affect (typically observed):  Agitated Orientation:  Oriented to Self Alcohol / Substance use:  Never Used Psych involvement (Current and /or in the community):  Yes (Comment)  Discharge Needs  Concerns to be addressed:  Care Coordination, Discharge Planning Concerns Readmission within the last 30 days:  No Current discharge risk:  Chronically ill Barriers to Discharge:  Continued Medical Work up   [employer] CorporationKaren M Buford Bremer, LCSW 06/28/2016, 3:53 PM

## 2016-06-28 NOTE — Progress Notes (Addendum)
Initial Nutrition Assessment  DOCUMENTATION CODES:   Severe malnutrition in context of chronic illness  INTERVENTION:  -Ensure Enlive po BID, each supplement provides 350 kcal and 20 grams of protein -Nutrition Support if family does not elect to go palliative route  NUTRITION DIAGNOSIS:   Malnutrition related to chronic illness as evidenced by severe depletion of muscle mass, severe depletion of body fat.  GOAL:   Patient will meet greater than or equal to 90% of their needs  MONITOR:   PO intake, I & O's, Supplement acceptance, Labs, Weight trends  REASON FOR ASSESSMENT:   Malnutrition Screening Tool    ASSESSMENT:   Dana SmokerJosephine Riddle  is a 82 y.o. female with a known history of dementia, type 2 diabetes insulin requiring who is presenting with altered mental status from her nursing facility. Patient is lethargic so to Hospital further workup and evaluation  [name] presents, non-verbal, refusing to eat x1 week. Nutrition-Focused physical exam completed. Findings are severe fat depletion, severe muscle depletion, and no edema.  Unsure of wt loss loss, though appears she can only shake her head in response to questions. Per chart review she exhibits insignificant wt loss over the past 9 months. Appears she did eat some yesterday for lunch, but nothing documented since then. Labs and medications reviewed: CBGs 105-269, Na 134 Marinol, Senokot, Iron NS @ 12000mL/hr  Diet Order:  Diet heart healthy/carb modified Room service appropriate? Yes; Fluid consistency: Thin  Skin:  Reviewed, no issues  Last BM:  PTA  Height:   Ht Readings from Last 1 Encounters:  06/26/16 5\' 3"  (1.6 m)    Weight:   Wt Readings from Last 1 Encounters:  06/27/16 89 lb 1.6 oz (40.4 kg)    Ideal Body Weight:  52.27 kg  BMI:  Body mass index is 15.78 kg/m.  Estimated Nutritional Needs:   Kcal:  1200-1415 calories  Protein:  40-50 gm  Fluid:  >/= 1.2L  EDUCATION NEEDS:   No  education needs identified at this time  Dana AnoWilliam M. Jocilynn Grade, MS, RD LDN Inpatient Clinical Dietitian Pager [phone]

## 2016-06-28 NOTE — Consult Note (Signed)
[facility] Face-to-Face Psychiatry Consult   Reason for Consult:  Consult for 82 year old woman with a known diagnosis of Alzheimer's disease. Came into the hospital with 1 week of worsening lethargy. Has a urinary tract infection. Concern about her not eating. Referring Physician:  Judeen Hammans Patient Identification: Dana Riddle MRN:  [id number] Principal Diagnosis: Alzheimer's dementia Diagnosis:   Patient Active Problem List   Diagnosis Date Noted  . Alzheimer's dementia [G30.9] 06/27/2016  . Acute renal failure (ARF) ([place]) [N17.9] 06/26/2016  . Anemia [D64.9] 08/16/2015  . CKD (chronic kidney disease), stage II [N18.2] 08/16/2015  . Hyperlipidemia [E78.5] 08/16/2015  . Protein-calorie malnutrition, severe [E43] 08/15/2015  . Hypertension [I10] 03/12/2015  . Diabetes mellitus without complication ([place]) [J03.0] 03/12/2015    Total Time spent with patient: 20 minutes  Subjective:   Dana Riddle is a 82 y.o. female patient admitted with patient was not able to answer any questions at all. She did shake her head back and forth a few times but not in a way that I thought had any meaning..  Follow-up for Sunday, October 1. Came to see patient again. At first she appeared to be the same as yesterday making eye contact and shaking her head no but not speaking. To my surprise however she suddenly smiled and told me she liked how I looked. She wouldn't say anything as a follow-up. She did whispered to me that she did not know whether she had had any food today. She did not know where she was currently. Overall appears a little more energetic. It looks like some of the contents of her food tray have been eaten.  HPI:  82 year old woman with a previously established diagnosis of Alzheimer's disease. Brought into the hospital with reports from her nursing home that she has had about a week of declining mental state. It's been documented by all providers since she came into the hospital that she is  nonverbal. Doesn't answer any questions lucidly. Patient is apparently not eating currently. I was not able to get any history from her. Every question that I ask her she would either not respond at all or would slowly shake her head back and forth. This did not really appear though to be a true indication of trying to say "no". I ask her if she could not her head yes and she was not able to do that at all. When I ask her if her name was Dana Riddle she shook her head in the no gesture.  Social history: Resides at a nursing home.    Past Psychiatric History: Patient has doses of Zoloft and Remeron already prescribed on admission. Doesn't have a clear psychiatric diagnosis and her past history. Not sure if those were provided for a specific concern about depression or possibly for sleep and appetite or somewhat more nonspecifically. It's been documented that she has Alzheimer's disease and that at baseline at most she is able to identify her name.  Risk to Self: Is patient at risk for suicide?: No Risk to Others:   Prior Inpatient Therapy:   Prior Outpatient Therapy:    Past Medical History:  Past Medical History:  Diagnosis Date  . Alzheimer's dementia   . Chronic kidney disease   . Diabetes mellitus without complication ([place])   . Hyperlipidemia   . Hypertension     Past Surgical History:  Procedure Laterality Date  . FEMUR IM NAIL Left 08/13/2015   Procedure: INTRAMEDULLARY (IM) NAIL FEMORAL;  Surgeon: Hessie Knows, MD;  Location: [facility] ORS;  Service: Orthopedics;  Laterality: Left;  . INTRAMEDULLARY (IM) NAIL INTERTROCHANTERIC Right 07/10/2015   Procedure: INTRAMEDULLARY (IM) NAIL INTERTROCHANTRIC;  Surgeon: Dereck Leep, MD;  Location: [facility] ORS;  Service: Orthopedics;  Laterality: Right;   Family History:  Family History  Problem Relation Age of Onset  . Cancer Father     lung   Family Psychiatric  History: Unknown Social History:  History  Alcohol Use No     History   Drug Use No    Social History   Social History  . Marital status: Divorced    Spouse name: N/A  . Number of children: N/A  . Years of education: N/A   Social History Main Topics  . Smoking status: Current Every Day Smoker    Packs/day: 1.00  . Smokeless tobacco: Never Used  . Alcohol use No  . Drug use: No  . Sexual activity: Not Asked   Other Topics Concern  . None   Social History Narrative  . None   Additional Social History:    Allergies:  No Known Allergies  Labs:  Results for orders placed or performed during the hospital encounter of 06/26/16 (from the past 48 hour(s))  Urinalysis complete, with microscopic ([facility] only)     Status: Abnormal   Collection Time: 06/26/16  5:30 PM  Result Value Ref Range   Color, Urine BROWN (A) YELLOW   APPearance TURBID (A) CLEAR   Glucose, UA (A) NEGATIVE mg/dL    TEST NOT REPORTED DUE TO COLOR INTERFERENCE OF URINE PIGMENT   Bilirubin Urine (A) NEGATIVE    TEST NOT REPORTED DUE TO COLOR INTERFERENCE OF URINE PIGMENT   Ketones, ur (A) NEGATIVE mg/dL    TEST NOT REPORTED DUE TO COLOR INTERFERENCE OF URINE PIGMENT   Specific Gravity, Urine 1.031 (H) 1.005 - 1.030   Hgb urine dipstick (A) NEGATIVE    TEST NOT REPORTED DUE TO COLOR INTERFERENCE OF URINE PIGMENT   pH  5.0 - 8.0    TEST NOT REPORTED DUE TO COLOR INTERFERENCE OF URINE PIGMENT   Protein, ur (A) NEGATIVE mg/dL    TEST NOT REPORTED DUE TO COLOR INTERFERENCE OF URINE PIGMENT   Nitrite (A) NEGATIVE    TEST NOT REPORTED DUE TO COLOR INTERFERENCE OF URINE PIGMENT   Leukocytes, UA (A) NEGATIVE    TEST NOT REPORTED DUE TO COLOR INTERFERENCE OF URINE PIGMENT   RBC / HPF TOO NUMEROUS TO COUNT 0 - 5 RBC/hpf   WBC, UA TOO NUMEROUS TO COUNT 0 - 5 WBC/hpf   Bacteria, UA MANY (A) NONE SEEN   Squamous Epithelial / LPF NONE SEEN NONE SEEN   WBC Clumps PRESENT    Mucous PRESENT   CBC with Differential     Status: Abnormal   Collection Time: 06/26/16  5:30 PM  Result Value  Ref Range   WBC 14.4 (H) 3.6 - 11.0 K/uL   RBC 3.28 (L) 3.80 - 5.20 MIL/uL   Hemoglobin 9.6 (L) 12.0 - 16.0 g/dL   HCT 29.6 (L) 35.0 - 47.0 %   MCV 90.2 80.0 - 100.0 fL   MCH 29.1 26.0 - 34.0 pg   MCHC 32.3 32.0 - 36.0 g/dL   RDW 14.0 11.5 - 14.5 %   Platelets 504 (H) 150 - 440 K/uL   Neutrophils Relative % 75 %   Neutro Abs 10.9 (H) 1.4 - 6.5 K/uL   Lymphocytes Relative 17 %   Lymphs Abs 2.4 1.0 - 3.6  K/uL   Monocytes Relative 6 %   Monocytes Absolute 0.9 0.2 - 0.9 K/uL   Eosinophils Relative 1 %   Eosinophils Absolute 0.1 0 - 0.7 K/uL   Basophils Relative 1 %   Basophils Absolute 0.1 0 - 0.1 K/uL  Comprehensive metabolic panel     Status: Abnormal   Collection Time: 06/26/16  5:30 PM  Result Value Ref Range   Sodium 128 (L) 135 - 145 mmol/L   Potassium 5.5 (H) 3.5 - 5.1 mmol/L    Comment: HEMOLYSIS AT THIS LEVEL MAY AFFECT RESULT   Chloride 99 (L) 101 - 111 mmol/L   CO2 19 (L) 22 - 32 mmol/L   Glucose, Bld 163 (H) 65 - 99 mg/dL   BUN 39 (H) 6 - 20 mg/dL   Creatinine, Ser 2.69 (H) 0.44 - 1.00 mg/dL   Calcium 8.2 (L) 8.9 - 10.3 mg/dL   Total Protein 7.1 6.5 - 8.1 g/dL   Albumin 2.5 (L) 3.5 - 5.0 g/dL   AST 16 15 - 41 U/L   ALT 7 (L) 14 - 54 U/L   Alkaline Phosphatase 93 38 - 126 U/L   Total Bilirubin 0.4 0.3 - 1.2 mg/dL   GFR calc non Af Amer 15 (L) >60 mL/min   GFR calc Af Amer 17 (L) >60 mL/min    Comment: (NOTE) The eGFR has been calculated using the CKD EPI equation. This calculation has not been validated in all clinical situations. eGFR's persistently <60 mL/min signify possible Chronic Kidney Disease.    Anion gap 10 5 - 15  Troponin I     Status: Abnormal   Collection Time: 06/26/16  5:30 PM  Result Value Ref Range   Troponin I 0.04 (HH) <0.03 ng/mL    Comment: CRITICAL RESULT CALLED TO, READ BACK BY AND VERIFIED WITH  DR. SCHAEVITZ AT 1929 06/26/16 SDR   TSH     Status: None   Collection Time: 06/26/16  5:30 PM  Result Value Ref Range   TSH 2.445 0.350  - 4.500 uIU/mL  Urine culture     Status: Abnormal (Preliminary result)   Collection Time: 06/26/16  5:30 PM  Result Value Ref Range   Specimen Description URINE, CATHETERIZED    Special Requests NONE    Culture >=100,000 COLONIES/mL ENTEROCOCCUS FAECALIS (A)    Report Status PENDING   Glucose, capillary     Status: Abnormal   Collection Time: 06/27/16 12:47 AM  Result Value Ref Range   Glucose-Capillary 104 (H) 65 - 99 mg/dL  MRSA PCR Screening     Status: Abnormal   Collection Time: 06/27/16  1:07 AM  Result Value Ref Range   MRSA by PCR POSITIVE (A) NEGATIVE    Comment:        The GeneXpert MRSA Assay (FDA approved for NASAL specimens only), is one component of a comprehensive MRSA colonization surveillance program. It is not intended to diagnose MRSA infection nor to guide or monitor treatment for MRSA infections. RESULT CALLED TO, READ BACK BY AND VERIFIED WITH: STACY CLAY AT [place] 06/27/16.PMH   CBC     Status: Abnormal   Collection Time: 06/27/16  4:31 AM  Result Value Ref Range   WBC 12.1 (H) 3.6 - 11.0 K/uL   RBC 3.08 (L) 3.80 - 5.20 MIL/uL   Hemoglobin 9.2 (L) 12.0 - 16.0 g/dL   HCT 27.9 (L) 35.0 - 47.0 %   MCV 90.4 80.0 - 100.0 fL   MCH 29.9 26.0 -  34.0 pg   MCHC 33.1 32.0 - 36.0 g/dL   RDW 13.2 11.5 - 14.5 %   Platelets 413 150 - 440 K/uL  Basic metabolic panel     Status: Abnormal   Collection Time: 06/27/16  4:31 AM  Result Value Ref Range   Sodium 134 (L) 135 - 145 mmol/L   Potassium 4.5 3.5 - 5.1 mmol/L   Chloride 108 101 - 111 mmol/L   CO2 21 (L) 22 - 32 mmol/L   Glucose, Bld 104 (H) 65 - 99 mg/dL   BUN 35 (H) 6 - 20 mg/dL   Creatinine, Ser 2.36 (H) 0.44 - 1.00 mg/dL   Calcium 8.0 (L) 8.9 - 10.3 mg/dL   GFR calc non Af Amer 18 (L) >60 mL/min   GFR calc Af Amer 20 (L) >60 mL/min    Comment: (NOTE) The eGFR has been calculated using the CKD EPI equation. This calculation has not been validated in all clinical situations. eGFR's persistently <60  mL/min signify possible Chronic Kidney Disease.    Anion gap 5 5 - 15  Troponin I     Status: Abnormal   Collection Time: 06/27/16  4:31 AM  Result Value Ref Range   Troponin I 0.03 (HH) <0.03 ng/mL    Comment: CRITICAL VALUE NOTED. VALUE IS CONSISTENT WITH PREVIOUSLY REPORTED/CALLED VALUE/HKP  Glucose, capillary     Status: None   Collection Time: 06/27/16  7:38 AM  Result Value Ref Range   Glucose-Capillary 98 65 - 99 mg/dL  Glucose, capillary     Status: Abnormal   Collection Time: 06/27/16 11:43 AM  Result Value Ref Range   Glucose-Capillary 128 (H) 65 - 99 mg/dL  Glucose, capillary     Status: Abnormal   Collection Time: 06/27/16  4:25 PM  Result Value Ref Range   Glucose-Capillary 269 (H) 65 - 99 mg/dL  Glucose, capillary     Status: Abnormal   Collection Time: 06/27/16 10:08 PM  Result Value Ref Range   Glucose-Capillary 107 (H) 65 - 99 mg/dL  Basic metabolic panel     Status: Abnormal   Collection Time: 06/28/16  5:40 AM  Result Value Ref Range   Sodium 134 (L) 135 - 145 mmol/L   Potassium 4.6 3.5 - 5.1 mmol/L   Chloride 110 101 - 111 mmol/L   CO2 19 (L) 22 - 32 mmol/L   Glucose, Bld 111 (H) 65 - 99 mg/dL   BUN 31 (H) 6 - 20 mg/dL   Creatinine, Ser 2.20 (H) 0.44 - 1.00 mg/dL   Calcium 7.6 (L) 8.9 - 10.3 mg/dL   GFR calc non Af Amer 19 (L) >60 mL/min   GFR calc Af Amer 22 (L) >60 mL/min    Comment: (NOTE) The eGFR has been calculated using the CKD EPI equation. This calculation has not been validated in all clinical situations. eGFR's persistently <60 mL/min signify possible Chronic Kidney Disease.    Anion gap 5 5 - 15  CBC     Status: Abnormal   Collection Time: 06/28/16  5:40 AM  Result Value Ref Range   WBC 10.5 3.6 - 11.0 K/uL   RBC 2.75 (L) 3.80 - 5.20 MIL/uL   Hemoglobin 8.1 (L) 12.0 - 16.0 g/dL   HCT 24.6 (L) 35.0 - 47.0 %   MCV 89.4 80.0 - 100.0 fL   MCH 29.6 26.0 - 34.0 pg   MCHC 33.1 32.0 - 36.0 g/dL   RDW 13.6 11.5 - 14.5 %  Platelets 365  150 - 440 K/uL  Glucose, capillary     Status: Abnormal   Collection Time: 06/28/16  7:26 AM  Result Value Ref Range   Glucose-Capillary 105 (H) 65 - 99 mg/dL  Glucose, capillary     Status: None   Collection Time: 06/28/16 12:03 PM  Result Value Ref Range   Glucose-Capillary 96 65 - 99 mg/dL    Current Facility-Administered Medications  Medication Dose Route Frequency Provider Last Rate Last Dose  . 0.9 %  sodium chloride infusion   Intravenous Continuous Lytle Butte, MD 100 mL/hr at 06/28/16 0608    . acetaminophen (TYLENOL) tablet 650 mg  650 mg Oral Q6H PRN Lytle Butte, MD       Or  . acetaminophen (TYLENOL) suppository 650 mg  650 mg Rectal Q6H PRN Lytle Butte, MD      . aspirin EC tablet 81 mg  81 mg Oral Daily Lytle Butte, MD   81 mg at 06/28/16 1046  . calcium carbonate (TUMS - dosed in mg elemental calcium) chewable tablet 200 mg of elemental calcium  1 tablet Oral BID Lytle Butte, MD   200 mg of elemental calcium at 06/28/16 1046  . ceFEPIme (MAXIPIME) 500 mg in dextrose 5 % 50 mL IVPB  500 mg Intravenous Q24H Merilyn Baba, RPH   500 mg at 06/27/16 1703  . Chlorhexidine Gluconate Cloth 2 % PADS 6 each  6 each Topical Q0600 Alexis Hugelmeyer, DO   6 each at 06/28/16 [phone]  . dronabinol (MARINOL) capsule 2.5 mg  2.5 mg Oral BID AC Theodoro Grist, MD   2.5 mg at 06/28/16 1218  . feeding supplement (ENSURE ENLIVE) (ENSURE ENLIVE) liquid 237 mL  237 mL Oral BID BM Lytle Butte, MD   237 mL at 06/27/16 1558  . ferrous sulfate tablet 325 mg  325 mg Oral BID WC Lytle Butte, MD   325 mg at 06/28/16 1046  . heparin injection 5,000 Units  5,000 Units Subcutaneous Q8H Lytle Butte, MD   5,000 Units at 06/28/16 0605  . insulin aspart (novoLOG) injection 0-5 Units  0-5 Units Subcutaneous QHS Lytle Butte, MD      . insulin aspart (novoLOG) injection 0-9 Units  0-9 Units Subcutaneous TID WC Lytle Butte, MD   5 Units at 06/27/16 1658  . mirtazapine (REMERON) tablet 15 mg  15 mg  Oral QHS Lytle Butte, MD      . mupirocin ointment (BACTROBAN) 2 % 1 application  1 application Nasal BID Alexis Hugelmeyer, DO   1 application at 29/92/42 1046  . ondansetron (ZOFRAN) tablet 4 mg  4 mg Oral Q6H PRN Lytle Butte, MD       Or  . ondansetron [facility]) injection 4 mg  4 mg Intravenous Q6H PRN Lytle Butte, MD      . oxyCODONE (Oxy IR/ROXICODONE) immediate release tablet 5 mg  5 mg Oral Q4H PRN Lytle Butte, MD      . Derrill Memo ON 06/29/2016] senna (SENOKOT) tablet 8.6 mg  1 tablet Oral Once per day on Mon Wed Fri Lytle Butte, MD      . sertraline (ZOLOFT) tablet 50 mg  50 mg Oral Daily Lytle Butte, MD   50 mg at 06/28/16 1046  . sodium chloride flush (NS) 0.9 % injection 3 mL  3 mL Intravenous Q12H Lytle Butte, MD   3 mL at 06/26/16  2246    Musculoskeletal: Strength & Muscle Tone: atrophy Gait & Station: unable to stand Patient leans: N/A  Psychiatric Specialty Exam: Physical Exam  Nursing note and vitals reviewed. Psychiatric: Her affect is blunt. She is withdrawn. She is noncommunicative.    Review of Systems  Unable to perform ROS: Patient nonverbal    Blood pressure (!) 108/46, pulse 64, temperature 98 F (36.7 C), temperature source Oral, resp. rate 16, height _0  (1.6 m), weight 40.4 kg (89 lb 1.6 oz), SpO2 98 %.Body mass index is 15.78 kg/m.  General Appearance: Fairly Groomed  Eye Contact:  Fair  Speech:  Slow  Volume:  Decreased  Mood:  Not stated  Affect:  Negative  Thought Process:  NA  Orientation:  Negative  Thought Content:  Negative  Suicidal Thoughts:  Not stated  Homicidal Thoughts:  Not stated  Memory:  Negative  Judgement:  Negative  Insight:  Negative  Psychomotor Activity:  Negative  Concentration:  Concentration: Negative  Recall:  Negative  Fund of Knowledge:  Negative  Language:  Negative  Akathisia:  Negative  Handed:  Right  AIMS (if indicated):     Assets:  Housing  ADL's:  Impaired  Cognition:  Impaired,  Severe   Sleep:        Treatment Plan Summary: Plan Patient looks a little bit brighter today. Most likely from treatment of her urinary tract infection and rehydration. I also note that her mirtazapine had been increased last night. She obviously has tolerated it fine. I'm still not going to make any further changes to her medicine but I'm glad to see that she looks a bit better today. I will continue to follow-up as needed.  Disposition: Patient does not meet criteria for psychiatric inpatient admission.  Alethia Berthold, MD 06/28/2016 4:28 PM

## 2016-06-29 LAB — BASIC METABOLIC PANEL
ANION GAP: 5 (ref 5–15)
BUN: 25 mg/dL — ABNORMAL HIGH (ref 6–20)
CALCIUM: 7.5 mg/dL — AB (ref 8.9–10.3)
CHLORIDE: 110 mmol/L (ref 101–111)
CO2: 17 mmol/L — AB (ref 22–32)
CREATININE: 2 mg/dL — AB (ref 0.44–1.00)
GFR calc non Af Amer: 22 mL/min — ABNORMAL LOW (ref >60)
GFR, EST AFRICAN AMERICAN: 25 mL/min — AB (ref >60)
Glucose, Bld: 80 mg/dL (ref 65–99)
Potassium: 4.5 mmol/L (ref 3.5–5.1)
SODIUM: 132 mmol/L — AB (ref 135–145)

## 2016-06-29 LAB — URINE CULTURE: Culture: >=100000 — AB

## 2016-06-29 LAB — URINALYSIS COMPLETE WITH MICROSCOPIC (ARMC ONLY)
BILIRUBIN URINE: NEGATIVE
GLUCOSE, UA: 50 mg/dL — AB
NITRITE: NEGATIVE
Protein, ur: 100 mg/dL — AB
SPECIFIC GRAVITY, URINE: 1.01 (ref 1.005–1.030)
Squamous Epithelial / LPF: NONE SEEN
pH: 6 (ref 5.0–8.0)

## 2016-06-29 LAB — GLUCOSE, CAPILLARY
GLUCOSE-CAPILLARY: 81 mg/dL (ref 65–99)
GLUCOSE-CAPILLARY: 213 mg/dL — AB (ref 65–99)
Glucose-Capillary: 174 mg/dL — ABNORMAL HIGH (ref 65–99)
Glucose-Capillary: 201 mg/dL — ABNORMAL HIGH (ref 65–99)

## 2016-06-29 LAB — HEMOGLOBIN: HEMOGLOBIN: 7.5 g/dL — AB (ref 12.0–16.0)

## 2016-06-29 MED ORDER — HALOPERIDOL LACTATE 5 MG/ML IJ SOLN: 1.0000 mg | Freq: Four times a day (QID) | INTRAMUSCULAR | Status: DC | PRN

## 2016-06-29 MED ORDER — AMOXICILLIN 250 MG/5ML PO SUSR
250.0000 mg | Freq: Three times a day (TID) | ORAL | Status: DC
  Administered 2016-06-29 – 2016-06-30 (×3): 250 mg via ORAL
  Filled 2016-06-29 (×5): qty 5

## 2016-06-29 NOTE — Progress Notes (Signed)
[facility] - [place] at [facility]   PATIENT NAME: Dana Riddle    MR#:  [id number]  DATE OF BIRTH:  [birth year]  SUBJECTIVE:  CHIEF COMPLAINT:   Chief Complaint  Patient presents with  . Altered Mental Status  . Recurrent UTI   The patient is 82 year old female with asthma, history significant for history of Alzheimer's dementia, CK D, ABDs mellitus, hypertension, hyperlipidemia, who presents to the hospital with altered mental status, acute and chronic renal failure. Patient is not able to provide review of systems, she refuses to eat, Take Medications. She was seen by Dr. Toni Amend, psychiatrist, who did not recommend any further therapies or investigations, but palliative care intervention and discussion with family. Patient is agitated intermittently and pushes nurse is awake. Her cultures came back enterococcus, antibiotic is changed to Augmentin orally. According to sensitivities. Patient's case was discussed with Mrs. Lazaro Arms, who is power of attorney for medical care for this patient, she was informed about medical decisions and the difficulties facing therapy    Review of Systems  Unable to perform ROS: Dementia    VITAL SIGNS: Blood pressure (!) 131/59, pulse 72, temperature 98 F (36.7 C), temperature source Axillary, resp. rate 18, height 5\' 3"  (1.6 m), weight 40.4 kg (89 lb 1.6 oz), SpO2 100 %.  PHYSICAL EXAMINATION:   GENERAL:  82 y.o.-year-old patient lying in the bed with no acute distress, intermittently agitated, cries loudly EYES: Pupils equal, round, reactive to light and accommodation. No scleral icterus. Extraocular muscles intact.  HEENT: Head atraumatic, normocephalic. Oropharynx and nasopharynx clear.  NECK:  Supple, no jugular venous distention. No thyroid enlargement, no tenderness.  LUNGS: Normal breath sounds bilaterally, no wheezing, rales,rhonchi or crepitation. No use of accessory muscles of respiration.  CARDIOVASCULAR: S1,  S2 normal. No murmurs, rubs, or gallops.  ABDOMEN: Soft, nontender, nondistended. Bowel sounds present. No organomegaly or mass.  EXTREMITIES: No pedal edema, cyanosis, or clubbing.  NEUROLOGIC: Cranial nerves II through XII are intact. Muscle strength , unable to evaluate in all extremities, able to move all extremities. Sensation grossly intact. Gait not checked.  PSYCHIATRIC: The patient is alert, unable to follow commands due to her dementia, unable to assess orientation, intermittently agitated SKIN: No obvious rash, lesion, or ulcer.   ORDERS/RESULTS REVIEWED:   CBC  Recent Labs Lab 06/26/16 1730 06/27/16 0431 06/28/16 0540 06/29/16 0615  WBC 14.4* 12.1* 10.5  --   HGB 9.6* 9.2* 8.1* 7.5*  HCT 29.6* 27.9* 24.6*  --   PLT 504* 413 365  --   MCV 90.2 90.4 89.4  --   MCH 29.1 29.9 29.6  --   MCHC 32.3 33.1 33.1  --   RDW 14.0 13.2 13.6  --   LYMPHSABS 2.4  --   --   --   MONOABS 0.9  --   --   --   EOSABS 0.1  --   --   --   BASOSABS 0.1  --   --   --    ------------------------------------------------------------------------------------------------------------------  Chemistries   Recent Labs Lab 06/26/16 1730 06/27/16 0431 06/28/16 0540 06/29/16 0615  NA 128* 134* 134* 132*  K 5.5* 4.5 4.6 4.5  CL 99* 108 110 110  CO2 19* 21* 19* 17*  GLUCOSE 163* 104* 111* 80  BUN 39* 35* 31* 25*  CREATININE 2.69* 2.36* 2.20* 2.00*  CALCIUM 8.2* 8.0* 7.6* 7.5*  AST 16  --   --   --   ALT 7*  --   --   --  ALKPHOS 93  --   --   --   BILITOT 0.4  --   --   --    ------------------------------------------------------------------------------------------------------------------ estimated creatinine clearance is 13.1 mL/min (by C-G formula based on SCr of 2 mg/dL (H)). ------------------------------------------------------------------------------------------------------------------  Recent Labs  06/26/16 1730  TSH 2.445    Cardiac Enzymes  Recent Labs Lab  06/26/16 1730 06/27/16 0431  TROPONINI 0.04* 0.03*   ------------------------------------------------------------------------------------------------------------------ Invalid input(s): POCBNP ---------------------------------------------------------------------------------------------------------------  RADIOLOGY: Ct Abdomen Pelvis Wo Contrast  Result Date: 06/28/2016 CLINICAL DATA:  Acute renal failure.  Hydronephrosis. EXAM: CT ABDOMEN AND PELVIS WITHOUT CONTRAST TECHNIQUE: Multidetector CT imaging of the abdomen and pelvis was performed following the standard protocol without IV contrast. COMPARISON:  Ultrasound of June 27, 2016. FINDINGS: Lower chest: Minimal bilateral posterior basilar subsegmental atelectasis is noted. Hepatobiliary: No gallstones are noted. No focal abnormality seen in the liver on these unenhanced images. Pancreas: Normal. Spleen: Normal. Adrenals/Urinary Tract: Adrenal glands appear normal. Right nephrolithiasis is noted. Severe bilateral hydroureteronephrosis is noted without definite evidence of obstructing calculus. Urinary bladder is mildly distended, with irregular wall thickening seen superiorly. This may represent cystitis, but neoplasm cannot be excluded. Some inflammatory changes are noted around the urinary bladder. Stomach/Bowel: There is no evidence of bowel obstruction. Vascular/Lymphatic: Atherosclerosis of abdominal aorta is noted without aneurysm formation. No significant adenopathy is noted. Reproductive: Uterus and ovaries are not clearly visualized. Other: Small while free fluid is seen in the pelvis. Musculoskeletal: Status post bilateral intra medullary rod fixation of both femurs. IMPRESSION: Right nephrolithiasis is noted. Severe bilateral hydroureteronephrosis is noted without definite evidence of obstructing calculus. Urinary bladder is mildly distended, with irregular wall thickening in its superior portion. Surrounding inflammatory changes are  noted suggesting cystitis, but neoplasm cannot be excluded and cystoscopy is recommended for further evaluation. Aortic atherosclerosis. Electronically Signed   By: Lupita Raider, M.D.   On: 06/28/2016 17:50    EKG:  Orders placed or performed during the hospital encounter of 06/26/16  . ED EKG  . ED EKG  . EKG 12-Lead  . EKG 12-Lead    ASSESSMENT AND PLAN:  Principal Problem:   Alzheimer's dementia Active Problems:   Acute renal failure (ARF) (HCC)  #1. Metabolic encephalopathy , likely delirium due to urinary tract infection, cannot rule out underlying organic disease, Appreciate psychiatrist input, no advancement of medications were recommended, continue supportive therapy, IV fluids. I discussed patient's case with word of state Mrs. Lazaro Arms, all questions were answered #2. Acute on chronic renal failure, questionable postobstructive, now Foley catheter is placed, improved with IV fluid administration yesterday, follow BMP  in the morning #3. Hyperkalemia, resolved with IV fluid administration #4. Elevated troponin, likely demand ischemia, echocardiogram revealed moderate LVH, normal ejection fraction at 75% to further interventions are recommended. Heart rate is in 60s #5. Leukocytosis, likely due to urinary tract infection, resolved with antibiotic therapy #6. Urinary tract infection due to enterococcus, discontinue cefepime, initiate ampicillin orally, follow clinically #7. Bilateral hydronephrosis with likely urinary bladder obstruction, now Foley catheter is placed, discussed with word of state. The risks as well as benefits of further investigation, including cystoscopy/urologic evaluation for possible bladder cancer. No obstructive stones were found on CT scan    Management plans discussed with the patient, family and they are in agreement.   DRUG ALLERGIES: No Known Allergies  CODE STATUS:     Code Status Orders        Start     Ordered   06/26/16  2012  Full  code  Continuous     06/26/16 2012    Code Status History    Date Active Date Inactive Code Status Order ID Comments User Context   08/13/2015  6:36 PM 08/16/2015 12:48 PM Full Code [id number]  Kennedy BuckerMichael Menz, MD Inpatient   08/11/2015  4:04 PM 08/13/2015  6:36 PM Full Code [id number]  Shaune PollackQing Chen, MD Inpatient   07/10/2015 10:13 PM 07/13/2015  4:49 PM Full Code [id number]  Donato HeinzJames P Hooten, MD Inpatient   07/09/2015  8:33 PM 07/10/2015 10:13 PM Full Code [id number]  Donato HeinzJames P Hooten, MD Inpatient   07/09/2015  4:07 PM 07/09/2015  8:33 PM Full Code [id number]  Alford Highlandichard Wieting, MD ED      TOTAL TIME TAKING CARE OF THIS PATIENT: 45 minutes.  Discussed with word of state Mrs. Natale LayKay Flack  Elesa Garman M.D on 06/29/2016 at 2:51 PM  Between 7am to 6pm - Pager - [phone]  After 6pm go to [email or web address] - password [email or web address] [facility]  [facility]  Office  [phone]  CC: Primary care physician; Vonita MossMark Crissman, MD

## 2016-06-29 NOTE — Progress Notes (Signed)
Palliative Medicine consult noted. Due to high referral volume, there may be a delay seeing this patient. Please call the Palliative Medicine Team office at [phone] if recommendations are needed in the interim.  Thank you for inviting us to see this patient.  Adin Lariccia G. Giuliana Handyside, RN, BSN, CHPN 06/29/2016 2:37 PM Cell [phone] 8:00-4:00 Monday-Friday Office [phone] 

## 2016-06-29 NOTE — Consult Note (Signed)
[facility] Face-to-Face Psychiatry Consult   Reason for Consult:  Consult for 82 year old woman with a known diagnosis of Alzheimer's disease. Came into the hospital with 1 week of worsening lethargy. Has a urinary tract infection. Concern about her not eating. Referring Physician:  Judeen Hammans Patient Identification: Dana Riddle MRN:  [id number] Principal Diagnosis: Alzheimer's dementia Diagnosis:   Patient Active Problem List   Diagnosis Date Noted  . Alzheimer's dementia [G30.9] 06/27/2016  . Acute renal failure (ARF) ([place]) [N17.9] 06/26/2016  . Anemia [D64.9] 08/16/2015  . CKD (chronic kidney disease), stage II [N18.2] 08/16/2015  . Hyperlipidemia [E78.5] 08/16/2015  . Protein-calorie malnutrition, severe [E43] 08/15/2015  . Hypertension [I10] 03/12/2015  . Diabetes mellitus without complication ([place]) [W23.7] 03/12/2015    Total Time spent with patient: 20 minutes  Subjective:   Dana Riddle is a 82 y.o. female patient admitted with patient was not able to answer any questions at all. She did shake her head back and forth a few times but not in a way that I thought had any meaning..  Follow-up Monday the second. Patient was asleep. Woke up when I spoke her name and touched her lightly. Did not speak in response to questions and went back to sleep. Looks like she is clearly eating a little bit at this point.  HPI:  82 year old woman with a previously established diagnosis of Alzheimer's disease. Brought into the hospital with reports from her nursing home that she has had about a week of declining mental state. It's been documented by all providers since she came into the hospital that she is nonverbal. Doesn't answer any questions lucidly. Patient is apparently not eating currently. I was not able to get any history from her. Every question that I ask her she would either not respond at all or would slowly shake her head back and forth. This did not really appear though to be a true  indication of trying to say "no". I ask her if she could not her head yes and she was not able to do that at all. When I ask her if her name was Dana Riddle she shook her head in the no gesture.  Social history: Resides at a nursing home.    Past Psychiatric History: Patient has doses of Zoloft and Remeron already prescribed on admission. Doesn't have a clear psychiatric diagnosis and her past history. Not sure if those were provided for a specific concern about depression or possibly for sleep and appetite or somewhat more nonspecifically. It's been documented that she has Alzheimer's disease and that at baseline at most she is able to identify her name.  Risk to Self: Is patient at risk for suicide?: No Risk to Others:   Prior Inpatient Therapy:   Prior Outpatient Therapy:    Past Medical History:  Past Medical History:  Diagnosis Date  . Alzheimer's dementia   . Chronic kidney disease   . Diabetes mellitus without complication ([place])   . Hyperlipidemia   . Hypertension     Past Surgical History:  Procedure Laterality Date  . FEMUR IM NAIL Left 08/13/2015   Procedure: INTRAMEDULLARY (IM) NAIL FEMORAL;  Surgeon: Hessie Knows, MD;  Location: [facility] ORS;  Service: Orthopedics;  Laterality: Left;  . INTRAMEDULLARY (IM) NAIL INTERTROCHANTERIC Right 07/10/2015   Procedure: INTRAMEDULLARY (IM) NAIL INTERTROCHANTRIC;  Surgeon: Dereck Leep, MD;  Location: [facility] ORS;  Service: Orthopedics;  Laterality: Right;   Family History:  Family History  Problem Relation Age of Onset  .  Cancer Father     lung   Family Psychiatric  History: Unknown Social History:  History  Alcohol Use No     History  Drug Use No    Social History   Social History  . Marital status: Divorced    Spouse name: N/A  . Number of children: N/A  . Years of education: N/A   Social History Main Topics  . Smoking status: Current Every Day Smoker    Packs/day: 1.00  . Smokeless tobacco: Never Used  .  Alcohol use No  . Drug use: No  . Sexual activity: Not Asked   Other Topics Concern  . None   Social History Narrative  . None   Additional Social History:    Allergies:  No Known Allergies  Labs:  Results for orders placed or performed during the hospital encounter of 06/26/16 (from the past 48 hour(s))  Glucose, capillary     Status: Abnormal   Collection Time: 06/27/16 10:08 PM  Result Value Ref Range   Glucose-Capillary 107 (H) 65 - 99 mg/dL  Basic metabolic panel     Status: Abnormal   Collection Time: 06/28/16  5:40 AM  Result Value Ref Range   Sodium 134 (L) 135 - 145 mmol/L   Potassium 4.6 3.5 - 5.1 mmol/L   Chloride 110 101 - 111 mmol/L   CO2 19 (L) 22 - 32 mmol/L   Glucose, Bld 111 (H) 65 - 99 mg/dL   BUN 31 (H) 6 - 20 mg/dL   Creatinine, Ser 2.20 (H) 0.44 - 1.00 mg/dL   Calcium 7.6 (L) 8.9 - 10.3 mg/dL   GFR calc non Af Amer 19 (L) >60 mL/min   GFR calc Af Amer 22 (L) >60 mL/min    Comment: (NOTE) The eGFR has been calculated using the CKD EPI equation. This calculation has not been validated in all clinical situations. eGFR's persistently <60 mL/min signify possible Chronic Kidney Disease.    Anion gap 5 5 - 15  CBC     Status: Abnormal   Collection Time: 06/28/16  5:40 AM  Result Value Ref Range   WBC 10.5 3.6 - 11.0 K/uL   RBC 2.75 (L) 3.80 - 5.20 MIL/uL   Hemoglobin 8.1 (L) 12.0 - 16.0 g/dL   HCT 24.6 (L) 35.0 - 47.0 %   MCV 89.4 80.0 - 100.0 fL   MCH 29.6 26.0 - 34.0 pg   MCHC 33.1 32.0 - 36.0 g/dL   RDW 13.6 11.5 - 14.5 %   Platelets 365 150 - 440 K/uL  Glucose, capillary     Status: Abnormal   Collection Time: 06/28/16  7:26 AM  Result Value Ref Range   Glucose-Capillary 105 (H) 65 - 99 mg/dL  Glucose, capillary     Status: None   Collection Time: 06/28/16 12:03 PM  Result Value Ref Range   Glucose-Capillary 96 65 - 99 mg/dL  Glucose, capillary     Status: Abnormal   Collection Time: 06/28/16  5:01 PM  Result Value Ref Range    Glucose-Capillary 156 (H) 65 - 99 mg/dL  Glucose, capillary     Status: Abnormal   Collection Time: 06/28/16  9:40 PM  Result Value Ref Range   Glucose-Capillary 116 (H) 65 - 99 mg/dL  Basic metabolic panel     Status: Abnormal   Collection Time: 06/29/16  6:15 AM  Result Value Ref Range   Sodium 132 (L) 135 - 145 mmol/L   Potassium 4.5 3.5 -  5.1 mmol/L   Chloride 110 101 - 111 mmol/L   CO2 17 (L) 22 - 32 mmol/L   Glucose, Bld 80 65 - 99 mg/dL   BUN 25 (H) 6 - 20 mg/dL   Creatinine, Ser 2.00 (H) 0.44 - 1.00 mg/dL   Calcium 7.5 (L) 8.9 - 10.3 mg/dL   GFR calc non Af Amer 22 (L) >60 mL/min   GFR calc Af Amer 25 (L) >60 mL/min    Comment: (NOTE) The eGFR has been calculated using the CKD EPI equation. This calculation has not been validated in all clinical situations. eGFR's persistently <60 mL/min signify possible Chronic Kidney Disease.    Anion gap 5 5 - 15  Hemoglobin     Status: Abnormal   Collection Time: 06/29/16  6:15 AM  Result Value Ref Range   Hemoglobin 7.5 (L) 12.0 - 16.0 g/dL  Glucose, capillary     Status: None   Collection Time: 06/29/16  7:47 AM  Result Value Ref Range   Glucose-Capillary 81 65 - 99 mg/dL   Comment 1 Notify RN   Urinalysis complete, with microscopic ([facility] only)     Status: Abnormal   Collection Time: 06/29/16 10:41 AM  Result Value Ref Range   Color, Urine RED (A) YELLOW   APPearance TURBID (A) CLEAR   Glucose, UA 50 (A) NEGATIVE mg/dL   Bilirubin Urine NEGATIVE NEGATIVE   Ketones, ur TRACE (A) NEGATIVE mg/dL   Specific Gravity, Urine 1.010 1.005 - 1.030   Hgb urine dipstick 3+ (A) NEGATIVE   pH 6.0 5.0 - 8.0   Protein, ur 100 (A) NEGATIVE mg/dL   Nitrite NEGATIVE NEGATIVE   Leukocytes, UA 2+ (A) NEGATIVE   RBC / HPF TOO NUMEROUS TO COUNT 0 - 5 RBC/hpf   WBC, UA TOO NUMEROUS TO COUNT 0 - 5 WBC/hpf   Bacteria, UA MANY (A) NONE SEEN   Squamous Epithelial / LPF NONE SEEN NONE SEEN   WBC Clumps PRESENT    Mucous PRESENT   Glucose,  capillary     Status: Abnormal   Collection Time: 06/29/16 11:44 AM  Result Value Ref Range   Glucose-Capillary 174 (H) 65 - 99 mg/dL   Comment 1 Notify RN     Current Facility-Administered Medications  Medication Dose Route Frequency Provider Last Rate Last Dose  . 0.9 %  sodium chloride infusion   Intravenous Continuous Lytle Butte, MD 100 mL/hr at 06/29/16 1409    . acetaminophen (TYLENOL) tablet 650 mg  650 mg Oral Q6H PRN Lytle Butte, MD       Or  . acetaminophen (TYLENOL) suppository 650 mg  650 mg Rectal Q6H PRN Lytle Butte, MD      . amoxicillin (AMOXIL) 250 MG/5ML suspension 250 mg  250 mg Oral Q8H Theodoro Grist, MD   250 mg at 06/29/16 1432  . aspirin EC tablet 81 mg  81 mg Oral Daily Lytle Butte, MD   81 mg at 06/29/16 1015  . calcium carbonate (TUMS - dosed in mg elemental calcium) chewable tablet 200 mg of elemental calcium  1 tablet Oral BID Lytle Butte, MD   200 mg of elemental calcium at 06/29/16 1015  . Chlorhexidine Gluconate Cloth 2 % PADS 6 each  6 each Topical Q0600 Alexis Hugelmeyer, DO   6 each at 06/29/16 0600  . dronabinol (MARINOL) capsule 2.5 mg  2.5 mg Oral BID AC Theodoro Grist, MD   2.5 mg at 06/28/16 1218  .  feeding supplement (ENSURE ENLIVE) (ENSURE ENLIVE) liquid 237 mL  237 mL Oral BID BM Lytle Butte, MD   237 mL at 06/29/16 1407  . ferrous sulfate tablet 325 mg  325 mg Oral BID WC Lytle Butte, MD   325 mg at 06/29/16 0844  . haloperidol lactate (HALDOL) injection 1 mg  1 mg Intravenous Q6H PRN Theodoro Grist, MD      . heparin injection 5,000 Units  5,000 Units Subcutaneous Q8H Lytle Butte, MD   5,000 Units at 06/29/16 1407  . insulin aspart (novoLOG) injection 0-5 Units  0-5 Units Subcutaneous QHS Lytle Butte, MD      . insulin aspart (novoLOG) injection 0-9 Units  0-9 Units Subcutaneous TID WC Lytle Butte, MD   2 Units at 06/29/16 1216  . mirtazapine (REMERON) tablet 15 mg  15 mg Oral QHS Lytle Butte, MD   15 mg at 06/28/16 2132  .  mupirocin ointment (BACTROBAN) 2 % 1 application  1 application Nasal BID Alexis Hugelmeyer, DO   1 application at 41/93/79 0844  . ondansetron (ZOFRAN) tablet 4 mg  4 mg Oral Q6H PRN Lytle Butte, MD       Or  . ondansetron [facility]) injection 4 mg  4 mg Intravenous Q6H PRN Lytle Butte, MD      . oxyCODONE (Oxy IR/ROXICODONE) immediate release tablet 5 mg  5 mg Oral Q4H PRN Lytle Butte, MD      . [facility]) tablet 8.6 mg  1 tablet Oral Once per day on Mon Wed Fri Lytle Butte, MD   8.6 mg at 06/29/16 0844  . sertraline (ZOLOFT) tablet 50 mg  50 mg Oral Daily Lytle Butte, MD   50 mg at 06/29/16 1015  . sodium chloride flush (NS) 0.9 % injection 3 mL  3 mL Intravenous Q12H Lytle Butte, MD   3 mL at 06/26/16 2246    Musculoskeletal: Strength & Muscle Tone: atrophy Gait & Station: unable to stand Patient leans: N/A  Psychiatric Specialty Exam: Physical Exam  Nursing note and vitals reviewed. Psychiatric: Her affect is blunt. She is withdrawn. She is noncommunicative.    Review of Systems  Unable to perform ROS: Patient nonverbal    Blood pressure (!) 131/59, pulse 72, temperature 98 F (36.7 C), temperature source Axillary, resp. rate 18, height 5' 3"  (1.6 m), weight 40.4 kg (89 lb 1.6 oz), SpO2 100 %.Body mass index is 15.78 kg/m.  General Appearance: Fairly Groomed  Eye Contact:  Fair  Speech:  Slow  Volume:  Decreased  Mood:  Not stated  Affect:  Negative  Thought Process:  NA  Orientation:  Negative  Thought Content:  Negative  Suicidal Thoughts:  Not stated  Homicidal Thoughts:  Not stated  Memory:  Negative  Judgement:  Negative  Insight:  Negative  Psychomotor Activity:  Negative  Concentration:  Concentration: Negative  Recall:  Negative  Fund of Knowledge:  Negative  Language:  Negative  Akathisia:  Negative  Handed:  Right  AIMS (if indicated):     Assets:  Housing  ADL's:  Impaired  Cognition:  Impaired,  Severe  Sleep:        Treatment  Plan Summary: Plan Oral intake improved. Affect flat. I still doubt that this is actually depression so much as just her advanced dementia and medical illness. I will follow up just as needed. No medicine change.  Disposition: Patient does not meet  criteria for psychiatric inpatient admission.  Alethia Berthold, MD 06/29/2016 4:43 PM

## 2016-06-29 NOTE — Progress Notes (Signed)
[facility]  ROUNDING NOTE   Subjective:   Indwelling foley catheter placed this morning.  Patient confused and unable to give much of a history.   Objective:  Vital signs in last 24 hours:  Temp:  [98 F (36.7 C)-98.6 F (37 C)] 98 F (36.7 C) (10/02 1301) Pulse Rate:  [72-83] 72 (10/02 1301) Resp:  [17-18] 18 (10/02 0433) BP: (124-134)/(52-60) 131/59 (10/02 1301) SpO2:  [100 %] 100 % (10/02 1301)  Weight change:  Filed Weights   06/26/16 1731 06/27/16 0028  Weight: 40.9 kg (90 lb 2.7 oz) 40.4 kg (89 lb 1.6 oz)    Intake/Output: I/O last 3 completed shifts: In: 2745.1 [I.V.:2745.1] Out: -    Intake/Output this shift:  Total I/O In: 1683 [P.O.:236; I.V.:1447] Out: 550 [Urine:550]  Physical Exam: General: NAD, laying in bed  Head: Normocephalic, atraumatic. Moist oral mucosal membranes  Eyes: Anicteric, PERRL  Neck: Supple, trachea midline  Lungs:  Clear to auscultation  Heart: Regular rate and rhythm  Abdomen:  Soft, nontender,   Extremities:  no peripheral edema.  Neurologic: Not following commands  Skin: No lesions  GU: Foley with dark urine    Basic Metabolic Panel:  Recent Labs Lab 06/26/16 1730 06/27/16 0431 06/28/16 0540 06/29/16 0615  NA 128* 134* 134* 132*  K 5.5* 4.5 4.6 4.5  CL 99* 108 110 110  CO2 19* 21* 19* 17*  GLUCOSE 163* 104* 111* 80  BUN 39* 35* 31* 25*  CREATININE 2.69* 2.36* 2.20* 2.00*  CALCIUM 8.2* 8.0* 7.6* 7.5*    Liver Function Tests:  Recent Labs Lab 06/26/16 1730  AST 16  ALT 7*  ALKPHOS 93  BILITOT 0.4  PROT 7.1  ALBUMIN 2.5*   No results for input(s): LIPASE, AMYLASE in the last 168 hours. No results for input(s): AMMONIA in the last 168 hours.  CBC:  Recent Labs Lab 06/26/16 1730 06/27/16 0431 06/28/16 0540 06/29/16 0615  WBC 14.4* 12.1* 10.5  --   NEUTROABS 10.9*  --   --   --   HGB 9.6* 9.2* 8.1* 7.5*  HCT 29.6* 27.9* 24.6*  --   MCV 90.2 90.4 89.4  --   PLT 504* 413 365  --      Cardiac Enzymes:  Recent Labs Lab 06/26/16 1730 06/27/16 0431  TROPONINI 0.04* 0.03*    BNP: Invalid input(s): POCBNP  CBG:  Recent Labs Lab 06/28/16 1203 06/28/16 1701 06/28/16 2140 06/29/16 0747 06/29/16 1144  GLUCAP 96 156* 116* 81 174*    Microbiology: Results for orders placed or performed during the hospital encounter of 06/26/16  Urine culture     Status: Abnormal   Collection Time: 06/26/16  5:30 PM  Result Value Ref Range Status   Specimen Description URINE, CATHETERIZED  Final   Special Requests NONE  Final   Culture >=100,000 COLONIES/mL ENTEROCOCCUS FAECALIS (A)  Final   Report Status 06/29/2016 FINAL  Final   Organism ID, Bacteria ENTEROCOCCUS FAECALIS (A)  Final      Susceptibility   Enterococcus faecalis - MIC*    AMPICILLIN <=2 SENSITIVE Sensitive     LEVOFLOXACIN >=8 RESISTANT Resistant     NITROFURANTOIN <=16 SENSITIVE Sensitive     VANCOMYCIN 1 SENSITIVE Sensitive     * >=100,000 COLONIES/mL ENTEROCOCCUS FAECALIS  MRSA PCR Screening     Status: Abnormal   Collection Time: 06/27/16  1:07 AM  Result Value Ref Range Status   MRSA by PCR POSITIVE (A) NEGATIVE Final  Comment:        The GeneXpert MRSA Assay (FDA approved for NASAL specimens only), is one component of a comprehensive MRSA colonization surveillance program. It is not intended to diagnose MRSA infection nor to guide or monitor treatment for MRSA infections. RESULT CALLED TO, READ BACK BY AND VERIFIED WITH: STACY CLAY AT 0256 06/27/16.PMH     Coagulation Studies: No results for input(s): LABPROT, INR in the last 72 hours.  Urinalysis:  Recent Labs  06/26/16 1730 06/29/16 1041  COLORURINE BROWN* RED*  LABSPEC 1.031* 1.010  PHURINE TEST NOT REPORTED DUE TO COLOR INTERFERENCE OF URINE PIGMENT 6.0  GLUCOSEU TEST NOT REPORTED DUE TO COLOR INTERFERENCE OF URINE PIGMENT* 50*  HGBUR TEST NOT REPORTED DUE TO COLOR INTERFERENCE OF URINE PIGMENT* 3+*  BILIRUBINUR TEST  NOT REPORTED DUE TO COLOR INTERFERENCE OF URINE PIGMENT* NEGATIVE  KETONESUR TEST NOT REPORTED DUE TO COLOR INTERFERENCE OF URINE PIGMENT* TRACE*  PROTEINUR TEST NOT REPORTED DUE TO COLOR INTERFERENCE OF URINE PIGMENT* 100*  NITRITE TEST NOT REPORTED DUE TO COLOR INTERFERENCE OF URINE PIGMENT* NEGATIVE  LEUKOCYTESUR TEST NOT REPORTED DUE TO COLOR INTERFERENCE OF URINE PIGMENT* 2+*      Imaging: Ct Abdomen Pelvis Wo Contrast  Result Date: 06/28/2016 CLINICAL DATA:  Acute renal failure.  Hydronephrosis. EXAM: CT ABDOMEN AND PELVIS WITHOUT CONTRAST TECHNIQUE: Multidetector CT imaging of the abdomen and pelvis was performed following the standard protocol without IV contrast. COMPARISON:  Ultrasound of June 27, 2016. FINDINGS: Lower chest: Minimal bilateral posterior basilar subsegmental atelectasis is noted. Hepatobiliary: No gallstones are noted. No focal abnormality seen in the liver on these unenhanced images. Pancreas: Normal. Spleen: Normal. Adrenals/Urinary Tract: Adrenal glands appear normal. Right nephrolithiasis is noted. Severe bilateral hydroureteronephrosis is noted without definite evidence of obstructing calculus. Urinary bladder is mildly distended, with irregular wall thickening seen superiorly. This may represent cystitis, but neoplasm cannot be excluded. Some inflammatory changes are noted around the urinary bladder. Stomach/Bowel: There is no evidence of bowel obstruction. Vascular/Lymphatic: Atherosclerosis of abdominal aorta is noted without aneurysm formation. No significant adenopathy is noted. Reproductive: Uterus and ovaries are not clearly visualized. Other: Small while free fluid is seen in the pelvis. Musculoskeletal: Status post bilateral intra medullary rod fixation of both femurs. IMPRESSION: Right nephrolithiasis is noted. Severe bilateral hydroureteronephrosis is noted without definite evidence of obstructing calculus. Urinary bladder is mildly distended, with irregular  wall thickening in its superior portion. Surrounding inflammatory changes are noted suggesting cystitis, but neoplasm cannot be excluded and cystoscopy is recommended for further evaluation. Aortic atherosclerosis. Electronically Signed   By: Lupita Raider, M.D.   On: 06/28/2016 17:50     Medications:   . sodium chloride 100 mL/hr at 06/29/16 0302   . amoxicillin  250 mg Oral Q8H  . aspirin EC  81 mg Oral Daily  . calcium carbonate  1 tablet Oral BID  . Chlorhexidine Gluconate Cloth  6 each Topical Q0600  . dronabinol  2.5 mg Oral BID AC  . feeding supplement (ENSURE ENLIVE)  237 mL Oral BID BM  . ferrous sulfate  325 mg Oral BID WC  . heparin  5,000 Units Subcutaneous Q8H  . insulin aspart  0-5 Units Subcutaneous QHS  . insulin aspart  0-9 Units Subcutaneous TID WC  . mirtazapine  15 mg Oral QHS  . mupirocin ointment  1 application Nasal BID  . senna  1 tablet Oral Once per day on Mon Wed Fri  . sertraline  50 mg  Oral Daily  . sodium chloride flush  3 mL Intravenous Q12H   acetaminophen **OR** acetaminophen, haloperidol lactate, ondansetron **OR** ondansetron (ZOFRAN) IV, oxyCODONE  Assessment/ Plan:  [name] is a 82 y.o. white female with Severe Alzheimer's dementia, diabetes mellitus, hypertension, hyperlipidemia, who was [facility] for evaluation of altered mental status.   1.  Acute renal failure with metabolic acidosis: secondary to obstructive uropathy with bilateral hydronephrosis. Foley catheter placed. Urine output now nonoliguric. No history of chronic kidney disease - Iv NS at 100 - Continue to monitor renal function. Not a candidate for dialysis.   2. Hyponatremia: secondary to renal failure - continue to monitor.   3. Anemia with renal failure: low threshold for epo.     LOS: 3 Michio Thier 10/2/20171:05 PM

## 2016-06-30 DIAGNOSIS — R4182 Altered mental status, unspecified: Secondary | ICD-10-CM

## 2016-06-30 DIAGNOSIS — Z7189 Other specified counseling: Secondary | ICD-10-CM

## 2016-06-30 DIAGNOSIS — Z515 Encounter for palliative care: Secondary | ICD-10-CM

## 2016-06-30 DIAGNOSIS — R627 Adult failure to thrive: Secondary | ICD-10-CM

## 2016-06-30 LAB — GLUCOSE, CAPILLARY
GLUCOSE-CAPILLARY: 131 mg/dL — AB (ref 65–99)
GLUCOSE-CAPILLARY: 208 mg/dL — AB (ref 65–99)
GLUCOSE-CAPILLARY: 233 mg/dL — AB (ref 65–99)
Glucose-Capillary: 118 mg/dL — ABNORMAL HIGH (ref 65–99)

## 2016-06-30 LAB — CREATININE, SERUM
CREATININE: 1.64 mg/dL — AB (ref 0.44–1.00)
GFR calc Af Amer: 32 mL/min — ABNORMAL LOW (ref >60)
GFR calc non Af Amer: 27 mL/min — ABNORMAL LOW (ref >60)

## 2016-06-30 LAB — HEMOGLOBIN: Hemoglobin: 8.1 g/dL — ABNORMAL LOW (ref 12.0–16.0)

## 2016-06-30 LAB — SODIUM: SODIUM: 134 mmol/L — AB (ref 135–145)

## 2016-06-30 MED ORDER — PANTOPRAZOLE SODIUM 40 MG PO TBEC
40.0000 mg | DELAYED_RELEASE_TABLET | Freq: Every day | ORAL | Status: DC
  Administered 2016-06-30 – 2016-07-01 (×2): 40 mg via ORAL
  Filled 2016-06-30 (×2): qty 1

## 2016-06-30 MED ORDER — AMPICILLIN SODIUM 1 G IJ SOLR
1.0000 g | Freq: Three times a day (TID) | INTRAMUSCULAR | Status: DC
  Administered 2016-07-01 (×2): 1 g via INTRAVENOUS
  Filled 2016-06-30 (×5): qty 1000

## 2016-06-30 NOTE — Consult Note (Signed)
Consultation Note Date: 06/30/2016   Patient Name: Dana Riddle  DOB: 1929/01/05  MRN: [id number]  Age / Sex: 82 y.o., female  PCP: Steele Sizer, MD Referring Physician: Katharina Caper, MD  Reason for Consultation: Disposition and Establishing goals of care  HPI/Patient Profile: 82 y.o. female  with past medical history of Alzheimer's dementia, hypertension, hyperlipidemia, diabetes mellitus without complication, chronic kidney disease, falls s/p right and left hip repairs, smoker and ward of the state admitted on 06/26/2016 with altered mental status from [facility] memory care unit. Acute renal failure-IVF initiated and nephrology consultation. Positive for urinary tract infection. Started on cefepime. Palliative medicine consultation for goals of care.   Clinical Assessment and Goals of Care: Spoke with legal guardian from [facility] of Social Services, Lazaro Arms via telephone. Dana Riddle tells me that Dana Riddle has a niece-in-law but no other family involved in her care. She has been living at [facility]. Baseline, she is able to verbally communicate but wheelchair bound.   Discussed with Dana Riddle Dana Riddle's current hospitalization. Her kidney functions have slightly improved with IVF. She is receiving antibiotics for UTI but remains worse then baseline. She has mostly been nodding yes and no with nursing staff with minimal verbal communication. Per RN, patient requires total assist with feeding but has eaten some of meals and enjoys the Ensure when given to her.   Discussed with Dana Riddle the trajectory of Alzheimer's and that this could possibly be her new baseline. Discussed code status with Dana Riddle and that it would be more harm then good to resuscitate Dana Riddle knowing this would not fix the Alzheimer's and cause her more harm than good. Also discussed MOST form.  Dana Riddle tells me that as a  patient advocate, she has wanted to keep Dana Riddle at [facility] because she enjoys smoking and they allow her to smoke in that facility, unlike other skilled nursing facilities. If higher level of care is recommended, Dana Riddle would proceed with paperwork to do so but would like to try to keep her at [facility] at this time.   Discussed how she would most likely be eligible for hospice services at the nursing facility. Dana Riddle tells me that they will not allow aids to come in that facility but a RN or NP would be able to follow her at the facility.   LEGAL GUARDIAN-Dana Riddle   SUMMARY OF RECOMMENDATIONS    Patient currently a FULL CODE. Discussed with legal guardian code status. Faxed recommendation letter (see paper chart copy). Legal guardian will follow up with [place] if approved by the Department of Social Services to change code status to DNR.  Encouraged MOST form.  May be eligible for hospice services at nursing facility. Will consult social work.  PMT will continue to follow patient throughout hospitalization.  Code Status/Advance Care Planning:  Full code   Symptom Management:   Per attending  Palliative Prophylaxis:   Aspiration and Delirium Protocol  Additional Recommendations (Limitations, Scope, Preferences):  Full  Scope Treatment  Psycho-social/Spiritual:   Desire for further Chaplaincy support:no  Additional Recommendations: Caregiving  Support/Resources and Education on Hospice  Prognosis:   Unable to determine  Discharge Planning: To Be Determined      Primary Diagnoses: Present on Admission: **None**   I have reviewed the medical record, interviewed the patient and family, and examined the patient. The following aspects are pertinent.  Past Medical History:  Diagnosis Date  . Alzheimer's dementia   . Chronic kidney disease   . Diabetes mellitus without complication (HCC)   . Hyperlipidemia   . Hypertension    Social History   Social History  .  Marital status: Divorced    Spouse name: N/A  . Number of children: N/A  . Years of education: N/A   Social History Main Topics  . Smoking status: Current Every Day Smoker    Packs/day: 1.00  . Smokeless tobacco: Never Used  . Alcohol use No  . Drug use: No  . Sexual activity: Not Asked   Other Topics Concern  . None   Social History Narrative  . None   Family History  Problem Relation Age of Onset  . Cancer Father     lung   Scheduled Meds: . ampicillin (OMNIPEN) IV  1 g Intravenous Q8H  . aspirin EC  81 mg Oral Daily  . calcium carbonate  1 tablet Oral BID  . Chlorhexidine Gluconate Cloth  6 each Topical Q0600  . dronabinol  2.5 mg Oral BID AC  . feeding supplement (ENSURE ENLIVE)  237 mL Oral BID BM  . ferrous sulfate  325 mg Oral BID WC  . heparin  5,000 Units Subcutaneous Q8H  . insulin aspart  0-5 Units Subcutaneous QHS  . insulin aspart  0-9 Units Subcutaneous TID WC  . mirtazapine  15 mg Oral QHS  . mupirocin ointment  1 application Nasal BID  . pantoprazole  40 mg Oral Daily  . senna  1 tablet Oral Once per day on Mon Wed Fri  . sertraline  50 mg Oral Daily  . sodium chloride flush  3 mL Intravenous Q12H   Continuous Infusions: . sodium chloride 50 mL/hr at 06/30/16 1057   PRN Meds:.acetaminophen **OR** acetaminophen, haloperidol lactate, ondansetron **OR** ondansetron (ZOFRAN) IV, oxyCODONE Medications Prior to Admission:  Prior to Admission medications   Medication Sig Start Date End Date Taking? Authorizing Provider  acetaminophen (TYLENOL) 500 MG tablet Take 500 mg by mouth every 6 (six) hours as needed for fever or headache.   Yes Historical Provider, MD  alendronate (FOSAMAX) 35 MG tablet Take 35 mg by mouth every 7 (seven) days. Take with a full glass of water on an empty stomach. Wednesdays   Yes Historical Provider, MD  aspirin EC 81 MG tablet Take 81 mg by mouth daily.   Yes Historical Provider, MD  calcium carbonate (TUMS - DOSED IN MG ELEMENTAL  CALCIUM) 500 MG chewable tablet Chew 1 tablet by mouth 2 (two) times daily.   Yes Historical Provider, MD  feeding supplement, ENSURE ENLIVE, (ENSURE ENLIVE) LIQD Take 237 mLs by mouth 2 (two) times daily between meals. 08/16/15  Yes Katharina Caperima Vaickute, MD  ferrous sulfate 325 (65 FE) MG tablet Take 1 tablet (325 mg total) by mouth 2 (two) times daily with a meal. 07/12/15  Yes Enid Baasadhika Kalisetti, MD  insulin aspart (NOVOLOG) 100 UNIT/ML injection Inject into the skin. Sliding scale 200-249 4 units 250-299 6 units 300-349 8 units 350-399 10 units  Yes Historical Provider, MD  mirtazapine (REMERON) 7.5 MG tablet Take 15 mg by mouth at bedtime.    Yes Historical Provider, MD  senna (SENOKOT) 8.6 MG TABS tablet Take 1 tablet by mouth 3 (three) times a week. Monday Wednesday Friday   Yes Historical Provider, MD  sertraline (ZOLOFT) 50 MG tablet Take 50 mg by mouth daily.   Yes Historical Provider, MD  sitaGLIPtin (JANUVIA) 25 MG tablet Take 25 mg by mouth daily.   Yes Historical Provider, MD   No Known Allergies Review of Systems  Unable to perform ROS: Dementia    Physical Exam  Constitutional: She is uncooperative.  Not following commands, nods head, minimal verbal communication  Pulmonary/Chest: Effort normal. No tachypnea. No respiratory distress.  Abdominal: Soft.  Neurological: She is alert. She is disoriented.  Skin: Skin is warm and dry.  Psychiatric: Cognition and memory are impaired.  Nursing note and vitals reviewed.   Vital Signs: BP (!) 110/48 (BP Location: Right Arm)   Pulse 69   Temp 97.5 F (36.4 C) (Oral)   Resp 16   Ht 5\' 3"  (1.6 m)   Wt 40.4 kg (89 lb 1.6 oz)   LMP  (LMP Unknown)   SpO2 98%   BMI 15.78 kg/m  Pain Assessment: No/denies pain   Pain Score: Asleep   SpO2: SpO2: 98 % O2 Device:SpO2: 98 % O2 Flow Rate: .   IO: Intake/output summary:   Intake/Output Summary (Last 24 hours) at 06/30/16 1725 Last data filed at 06/30/16 1503  Riddle per 24 hour    Intake             1905 ml  Output             2750 ml  Net             -845 ml    LBM: Last BM Date: 06/28/16 Baseline Weight: Weight: 40.9 kg (90 lb 2.7 oz) Most recent weight: Weight: 40.4 kg (89 lb 1.6 oz)     Palliative Assessment/Data: PPS 30%   Flowsheet Rows   Flowsheet Row Most Recent Value  Intake Tab  Referral Department  Hospitalist  Unit at Time of Referral  Cardiac/Telemetry Unit  Palliative Care Primary Diagnosis  Neurology  Date Notified  06/27/16  Palliative Care Type  New Palliative care  Reason for referral  Clarify Goals of Care, Advance Care Planning  Date of Admission  06/27/16  Date first seen by Palliative Care  06/30/16  # of days Palliative referral response time  3 Day(s)  # of days IP prior to Palliative referral  0  Clinical Assessment  Palliative Performance Scale Score  30%  Psychosocial & Spiritual Assessment  Palliative Care Outcomes  Patient/Family meeting held?  Yes  Who was at the meeting?  spoke with legal guardian via telephone  Palliative Care Outcomes  Provided advance care planning, Clarified goals of care      Time In: 1300 Time Out: 1410 Time Total: Greater than 50%  of this time was spent counseling and coordinating care related to the above assessment and plan.  Signed by:  Vennie Homans, FNP-C Palliative Medicine Team  Phone: [phone] Fax: [phone]   Please contact Palliative Medicine Team phone at [phone] for questions and concerns.  For individual provider: See Loretha Stapler

## 2016-06-30 NOTE — Progress Notes (Signed)
[facility]  ROUNDING NOTE   Subjective:   Patient more alert this morning.  Creatinine 1.64 (2)  UOP 2625  Objective:  Vital signs in last 24 hours:  Temp:  [97.4 F (36.3 C)-98 F (36.7 C)] 97.4 F (36.3 C) (10/02 2133) Pulse Rate:  [70-72] 70 (10/02 2133) Resp:  [16] 16 (10/02 2133) BP: (131-160)/(59-71) 160/71 (10/02 2133) SpO2:  [100 %] 100 % (10/02 2133)  Weight change:  Filed Weights   06/26/16 1731 06/27/16 0028  Weight: 40.9 kg (90 lb 2.7 oz) 40.4 kg (89 lb 1.6 oz)    Intake/Output: I/O last 3 completed shifts: In: 3575.4 [P.O.:656; I.V.:2919.4] Out: 2625 [Urine:2625]   Intake/Output this shift:  Total I/O In: 380 [P.O.:237; I.V.:143] Out: 350 [Urine:350]  Physical Exam: General: NAD, laying in bed  Head: Normocephalic, atraumatic. Moist oral mucosal membranes  Eyes: Anicteric, PERRL  Neck: Supple, trachea midline  Lungs:  Clear to auscultation  Heart: Regular rate and rhythm  Abdomen:  Soft, nontender,   Extremities:  no peripheral edema.  Neurologic: Not following commands  Skin: No lesions  GU: Foley with dark urine    Basic Metabolic Panel:  Recent Labs Lab 06/26/16 1730 06/27/16 0431 06/28/16 0540 06/29/16 0615 06/30/16 0430  NA 128* 134* 134* 132* 134*  K 5.5* 4.5 4.6 4.5  --   CL 99* 108 110 110  --   CO2 19* 21* 19* 17*  --   GLUCOSE 163* 104* 111* 80  --   BUN 39* 35* 31* 25*  --   CREATININE 2.69* 2.36* 2.20* 2.00* 1.64*  CALCIUM 8.2* 8.0* 7.6* 7.5*  --     Liver Function Tests:  Recent Labs Lab 06/26/16 1730  AST 16  ALT 7*  ALKPHOS 93  BILITOT 0.4  PROT 7.1  ALBUMIN 2.5*   No results for input(s): LIPASE, AMYLASE in the last 168 hours. No results for input(s): AMMONIA in the last 168 hours.  CBC:  Recent Labs Lab 06/26/16 1730 06/27/16 0431 06/28/16 0540 06/29/16 0615 06/30/16 0430  WBC 14.4* 12.1* 10.5  --   --   NEUTROABS 10.9*  --   --   --   --   HGB 9.6* 9.2* 8.1* 7.5* 8.1*  HCT  29.6* 27.9* 24.6*  --   --   MCV 90.2 90.4 89.4  --   --   PLT 504* 413 365  --   --     Cardiac Enzymes:  Recent Labs Lab 06/26/16 1730 06/27/16 0431  TROPONINI 0.04* 0.03*    BNP: Invalid input(s): POCBNP  CBG:  Recent Labs Lab 06/29/16 0747 06/29/16 1144 06/29/16 1705 06/29/16 2121 06/30/16 0739  GLUCAP 81 174* 201* 213* 118*    Microbiology: Results for orders placed or performed during the hospital encounter of 06/26/16  Urine culture     Status: Abnormal   Collection Time: 06/26/16  5:30 PM  Result Value Ref Range Status   Specimen Description URINE, CATHETERIZED  Final   Special Requests NONE  Final   Culture >=100,000 COLONIES/mL ENTEROCOCCUS FAECALIS (A)  Final   Report Status 06/29/2016 FINAL  Final   Organism ID, Bacteria ENTEROCOCCUS FAECALIS (A)  Final      Susceptibility   Enterococcus faecalis - MIC*    AMPICILLIN <=2 SENSITIVE Sensitive     LEVOFLOXACIN >=8 RESISTANT Resistant     NITROFURANTOIN <=16 SENSITIVE Sensitive     VANCOMYCIN 1 SENSITIVE Sensitive     * >=100,000 COLONIES/mL ENTEROCOCCUS FAECALIS  MRSA PCR Screening     Status: Abnormal   Collection Time: 06/27/16  1:07 AM  Result Value Ref Range Status   MRSA by PCR POSITIVE (A) NEGATIVE Final    Comment:        The GeneXpert MRSA Assay (FDA approved for NASAL specimens only), is one component of a comprehensive MRSA colonization surveillance program. It is not intended to diagnose MRSA infection nor to guide or monitor treatment for MRSA infections. RESULT CALLED TO, READ BACK BY AND VERIFIED WITH: STACY CLAY AT 0256 06/27/16.PMH     Coagulation Studies: No results for input(s): LABPROT, INR in the last 72 hours.  Urinalysis:  Recent Labs  06/29/16 1041  COLORURINE RED*  LABSPEC 1.010  PHURINE 6.0  GLUCOSEU 50*  HGBUR 3+*  BILIRUBINUR NEGATIVE  KETONESUR TRACE*  PROTEINUR 100*  NITRITE NEGATIVE  LEUKOCYTESUR 2+*      Imaging: Ct Abdomen Pelvis Wo  Contrast  Result Date: 06/28/2016 CLINICAL DATA:  Acute renal failure.  Hydronephrosis. EXAM: CT ABDOMEN AND PELVIS WITHOUT CONTRAST TECHNIQUE: Multidetector CT imaging of the abdomen and pelvis was performed following the standard protocol without IV contrast. COMPARISON:  Ultrasound of June 27, 2016. FINDINGS: Lower chest: Minimal bilateral posterior basilar subsegmental atelectasis is noted. Hepatobiliary: No gallstones are noted. No focal abnormality seen in the liver on these unenhanced images. Pancreas: Normal. Spleen: Normal. Adrenals/Urinary Tract: Adrenal glands appear normal. Right nephrolithiasis is noted. Severe bilateral hydroureteronephrosis is noted without definite evidence of obstructing calculus. Urinary bladder is mildly distended, with irregular wall thickening seen superiorly. This may represent cystitis, but neoplasm cannot be excluded. Some inflammatory changes are noted around the urinary bladder. Stomach/Bowel: There is no evidence of bowel obstruction. Vascular/Lymphatic: Atherosclerosis of abdominal aorta is noted without aneurysm formation. No significant adenopathy is noted. Reproductive: Uterus and ovaries are not clearly visualized. Other: Small while free fluid is seen in the pelvis. Musculoskeletal: Status post bilateral intra medullary rod fixation of both femurs. IMPRESSION: Right nephrolithiasis is noted. Severe bilateral hydroureteronephrosis is noted without definite evidence of obstructing calculus. Urinary bladder is mildly distended, with irregular wall thickening in its superior portion. Surrounding inflammatory changes are noted suggesting cystitis, but neoplasm cannot be excluded and cystoscopy is recommended for further evaluation. Aortic atherosclerosis. Electronically Signed   By: Lupita Raider, M.D.   On: 06/28/2016 17:50     Medications:   . sodium chloride 100 mL/hr at 06/30/16 0322   . amoxicillin  250 mg Oral Q8H  . aspirin EC  81 mg Oral Daily   . calcium carbonate  1 tablet Oral BID  . Chlorhexidine Gluconate Cloth  6 each Topical Q0600  . dronabinol  2.5 mg Oral BID AC  . feeding supplement (ENSURE ENLIVE)  237 mL Oral BID BM  . ferrous sulfate  325 mg Oral BID WC  . heparin  5,000 Units Subcutaneous Q8H  . insulin aspart  0-5 Units Subcutaneous QHS  . insulin aspart  0-9 Units Subcutaneous TID WC  . mirtazapine  15 mg Oral QHS  . mupirocin ointment  1 application Nasal BID  . pantoprazole  40 mg Oral Daily  . senna  1 tablet Oral Once per day on Mon Wed Fri  . sertraline  50 mg Oral Daily  . sodium chloride flush  3 mL Intravenous Q12H   acetaminophen **OR** acetaminophen, haloperidol lactate, ondansetron **OR** ondansetron (ZOFRAN) IV, oxyCODONE  Assessment/ Plan:  [name] is a 82 y.o. white female with Severe  Alzheimer's dementia, diabetes mellitus, hypertension, hyperlipidemia, who was [facility]26[facility] for evaluation of altered mental status.   1.  Acute renal failure with metabolic acidosis: secondary to obstructive uropathy with bilateral hydronephrosis. Foley catheter placed. Urine output now nonoliguric. No history of chronic kidney disease - IV NS at 50 - Appreciate  Psych imput  2. Urinary Tract Infection: 9/29: urine culture with E. Faecalis - amoxicillin  3. Hyponatremia: secondary to renal failure. Improved to 134.  - continue to monitor.   4. Anemia with renal failure: low threshold for epo.     LOS: 4 Tiarrah Saville 10/3/201710:56 AM

## 2016-06-30 NOTE — Progress Notes (Signed)
[facility] - [place] at [facility]   PATIENT NAME: Dana Riddle    MR#:  [id number]  DATE OF BIRTH:  Jun 19, 1929  SUBJECTIVE:  CHIEF COMPLAINT:   Chief Complaint  Patient presents with  . Altered Mental Status  . Recurrent UTI   The patient is 82 year old female with asthma, history significant for history of Alzheimer's dementia, CK D, ABDs mellitus, hypertension, hyperlipidemia, who presents to the hospital with altered mental status, acute and chronic renal failure. Patient is not able to provide review of systems, she refuses to eat, Take Medications. She was seen by Dr. Toni Amend, psychiatrist, who did not recommend any further therapies or investigations, but palliative care intervention and discussion with family. Patient is agitated intermittently and pushes nurse is awake. Her cultures came back enterococcus, antibiotic is changed to Augmentin orally. According to sensitivities. Patient's case was discussed with Dana Riddle, who is power of attorney for medical care for this patient, she was informed about medical decisions and the difficulties facing therapy  The patient has been eating better today, although had episode of nausea and vomiting after breakfast earlier today. Kidney function has improved with Foley catheter placed and antibiotics initiated for enterococcus infection. Patient is nonverbal, however smiles upon my arrival  Review of Systems  Unable to perform ROS: Dementia    VITAL SIGNS: Blood pressure (!) 160/71, pulse 70, temperature 97.4 F (36.3 C), temperature source Axillary, resp. rate 16, height 5\' 3"  (1.6 m), weight 40.4 kg (89 lb 1.6 oz), SpO2 100 %.  PHYSICAL EXAMINATION:   GENERAL:  82 y.o.-year-old patient lying in the bed with no acute distress, awake, alert, smiles whenever approached, however, of nonverbal, willingly takes bites of food and swallows.  EYES: Pupils equal, round, reactive to light and accommodation. No  scleral icterus. Extraocular muscles intact.  HEENT: Head atraumatic, normocephalic. Oropharynx and nasopharynx clear.  NECK:  Supple, no jugular venous distention. No thyroid enlargement, no tenderness.  LUNGS: Normal breath sounds bilaterally, no wheezing, rales,rhonchi or crepitation. No use of accessory muscles of respiration.  CARDIOVASCULAR: S1, S2 normal. No murmurs, rubs, or gallops.  ABDOMEN: Soft, nontender, nondistended. Bowel sounds present. No organomegaly or mass.  EXTREMITIES: No pedal edema, cyanosis, or clubbing. Foley bag has clear light. Urine NEUROLOGIC: Cranial nerves II through XII are intact. Muscle strength , unable to evaluate in all extremities, able to move all extremities. Sensation grossly intact. Gait not checked.  PSYCHIATRIC: The patient is alert, unable to follow commands due to her dementia, unable to assess orientation, intermittently agitated SKIN: No obvious rash, lesion, or ulcer.   ORDERS/RESULTS REVIEWED:   CBC  Recent Labs Lab 06/26/16 1730 06/27/16 0431 06/28/16 0540 06/29/16 0615 06/30/16 0430  WBC 14.4* 12.1* 10.5  --   --   HGB 9.6* 9.2* 8.1* 7.5* 8.1*  HCT 29.6* 27.9* 24.6*  --   --   PLT 504* 413 365  --   --   MCV 90.2 90.4 89.4  --   --   MCH 29.1 29.9 29.6  --   --   MCHC 32.3 33.1 33.1  --   --   RDW 14.0 13.2 13.6  --   --   LYMPHSABS 2.4  --   --   --   --   MONOABS 0.9  --   --   --   --   EOSABS 0.1  --   --   --   --   BASOSABS 0.1  --   --   --   --    ------------------------------------------------------------------------------------------------------------------  Chemistries   Recent Labs Lab 06/26/16 1730 06/27/16 0431 06/28/16 0540 06/29/16 0615 06/30/16 0430  NA 128* 134* 134* 132* 134*  K 5.5* 4.5 4.6 4.5  --   CL 99* 108 110 110  --   CO2 19* 21* 19* 17*  --   GLUCOSE 163* 104* 111* 80  --   BUN 39* 35* 31* 25*  --   CREATININE 2.69* 2.36* 2.20* 2.00* 1.64*  CALCIUM 8.2* 8.0* 7.6* 7.5*  --   AST 16   --   --   --   --   ALT 7*  --   --   --   --   ALKPHOS 93  --   --   --   --   BILITOT 0.4  --   --   --   --    ------------------------------------------------------------------------------------------------------------------ estimated creatinine clearance is 16 mL/min (by C-G formula based on SCr of 1.64 mg/dL (H)). ------------------------------------------------------------------------------------------------------------------ No results for input(s): TSH, T4TOTAL, T3FREE, THYROIDAB in the last 72 hours.  Invalid input(s): FREET3  Cardiac Enzymes  Recent Labs Lab 06/26/16 1730 06/27/16 0431  TROPONINI 0.04* 0.03*   ------------------------------------------------------------------------------------------------------------------ Invalid input(s): POCBNP ---------------------------------------------------------------------------------------------------------------  RADIOLOGY: Ct Abdomen Pelvis Wo Contrast  Result Date: 06/28/2016 CLINICAL DATA:  Acute renal failure.  Hydronephrosis. EXAM: CT ABDOMEN AND PELVIS WITHOUT CONTRAST TECHNIQUE: Multidetector CT imaging of the abdomen and pelvis was performed following the standard protocol without IV contrast. COMPARISON:  Ultrasound of June 27, 2016. FINDINGS: Lower chest: Minimal bilateral posterior basilar subsegmental atelectasis is noted. Hepatobiliary: No gallstones are noted. No focal abnormality seen in the liver on these unenhanced images. Pancreas: Normal. Spleen: Normal. Adrenals/Urinary Tract: Adrenal glands appear normal. Right nephrolithiasis is noted. Severe bilateral hydroureteronephrosis is noted without definite evidence of obstructing calculus. Urinary bladder is mildly distended, with irregular wall thickening seen superiorly. This may represent cystitis, but neoplasm cannot be excluded. Some inflammatory changes are noted around the urinary bladder. Stomach/Bowel: There is no evidence of bowel obstruction.  Vascular/Lymphatic: Atherosclerosis of abdominal aorta is noted without aneurysm formation. No significant adenopathy is noted. Reproductive: Uterus and ovaries are not clearly visualized. Other: Small while free fluid is seen in the pelvis. Musculoskeletal: Status post bilateral intra medullary rod fixation of both femurs. IMPRESSION: Right nephrolithiasis is noted. Severe bilateral hydroureteronephrosis is noted without definite evidence of obstructing calculus. Urinary bladder is mildly distended, with irregular wall thickening in its superior portion. Surrounding inflammatory changes are noted suggesting cystitis, but neoplasm cannot be excluded and cystoscopy is recommended for further evaluation. Aortic atherosclerosis. Electronically Signed   By: Lupita Raider, M.D.   On: 06/28/2016 17:50    EKG:  Orders placed or performed during the hospital encounter of 06/26/16  . ED EKG  . ED EKG  . EKG 12-Lead  . EKG 12-Lead    ASSESSMENT AND PLAN:  Principal Problem:   Alzheimer's dementia Active Problems:   Acute renal failure (ARF) (HCC)  #1. Delirium due to urinary tract infection, underlying dementia , appreciate psychiatrist input, no advancement of medications were recommended, continue supportive therapy, IV fluids. I discussed patient's case with word of state Dana Riddle yesterday, the patient has clinically improved  #2. Acute on chronic renal failure, postobstructive, now Foley catheter is placed, improved with IV fluid administration , follow BMP  in the morning #3. Hyperkalemia, resolved with IV fluid administration #4. Elevated troponin, likely demand ischemia, echocardiogram revealed moderate LVH, normal ejection fraction at 75% , no further interventions  are recommended. Heart rate is in 60s #5. Leukocytosis, due to urinary tract infection, resolved with antibiotic therapy #6. Urinary tract infection due to enterococcus, now off cefepime, continue amoxicillin orally,  clinically improved  #7. Bilateral hydronephrosis with urinary bladder obstruction, now Foley catheter is placed, discussed with word of state yesterday risks as well as benefits of further investigation, including cystoscopy/urologic evaluation for possible bladder cancer. No obstructive stones were found on CT scan. She will need to continue Foley catheter until she seen by urologist as outpatient.     Management plans discussed with the patient, family and they are in agreement.   DRUG ALLERGIES: No Known Allergies  CODE STATUS:     Code Status Orders        Start     Ordered   06/26/16 2012  Full code  Continuous     06/26/16 2012    Code Status History    Date Active Date Inactive Code Status Order ID Comments User Context   08/13/2015  6:36 PM 08/16/2015 12:48 PM Full Code [id number]  Kennedy BuckerMichael Menz, MD Inpatient   08/11/2015  4:04 PM 08/13/2015  6:36 PM Full Code [id number]  Shaune PollackQing Chen, MD Inpatient   07/10/2015 10:13 PM 07/13/2015  4:49 PM Full Code [id number]  Donato HeinzJames P Hooten, MD Inpatient   07/09/2015  8:33 PM 07/10/2015 10:13 PM Full Code [id number]  Donato HeinzJames P Hooten, MD Inpatient   07/09/2015  4:07 PM 07/09/2015  8:33 PM Full Code [id number]  Alford Highlandichard Wieting, MD ED      TOTAL TIME TAKING CARE OF THIS PATIENT: 35  minutes.    Katharina CaperVAICKUTE,Donnah Levert M.D on 06/30/2016 at 1:20 PM  Between 7am to 6pm - Pager - [phone]  After 6pm go to [email or web address] - password [email or web address] [facility]  [facility]  Office  [phone]  CC: Primary care physician; Vonita MossMark Crissman, MD

## 2016-07-01 LAB — BASIC METABOLIC PANEL
ANION GAP: 4 — AB (ref 5–15)
BUN: 21 mg/dL — AB (ref 6–20)
CHLORIDE: 108 mmol/L (ref 101–111)
CO2: 21 mmol/L — AB (ref 22–32)
Calcium: 7.8 mg/dL — ABNORMAL LOW (ref 8.9–10.3)
Creatinine, Ser: 1.43 mg/dL — ABNORMAL HIGH (ref 0.44–1.00)
GFR calc Af Amer: 38 mL/min — ABNORMAL LOW (ref >60)
GFR calc non Af Amer: 32 mL/min — ABNORMAL LOW (ref >60)
GLUCOSE: 135 mg/dL — AB (ref 65–99)
POTASSIUM: 4.3 mmol/L (ref 3.5–5.1)
Sodium: 133 mmol/L — ABNORMAL LOW (ref 135–145)

## 2016-07-01 LAB — CBC
HEMATOCRIT: 25.8 % — AB (ref 35.0–47.0)
HEMOGLOBIN: 8.6 g/dL — AB (ref 12.0–16.0)
MCH: 29.9 pg (ref 26.0–34.0)
MCHC: 33.5 g/dL (ref 32.0–36.0)
MCV: 89.4 fL (ref 80.0–100.0)
Platelets: 393 10*3/uL (ref 150–440)
RBC: 2.89 MIL/uL — ABNORMAL LOW (ref 3.80–5.20)
RDW: 13.5 % (ref 11.5–14.5)
WBC: 10.3 10*3/uL (ref 3.6–11.0)

## 2016-07-01 LAB — GLUCOSE, CAPILLARY
GLUCOSE-CAPILLARY: 143 mg/dL — AB (ref 65–99)
Glucose-Capillary: 190 mg/dL — ABNORMAL HIGH (ref 65–99)

## 2016-07-01 LAB — URINE CULTURE

## 2016-07-01 MED ORDER — DRONABINOL 2.5 MG PO CAPS: 2.5000 mg | ORAL_CAPSULE | Freq: Two times a day (BID) | ORAL | 0 refills | Status: AC

## 2016-07-01 MED ORDER — OXYCODONE HCL 5 MG PO TABS: 5.0000 mg | ORAL_TABLET | ORAL | 0 refills | Status: AC | PRN

## 2016-07-01 NOTE — Progress Notes (Signed)
Inpatient Diabetes Program Recommendations  AACE/ADA: New Consensus Statement on Inpatient Glycemic Control (2015)  Target Ranges:  Prepandial:   less than 140 mg/dL      Peak postprandial:   less than 180 mg/dL (1-2 hours)      Critically ill patients:  140 - 180 mg/dL   Results for Dana Riddle, Shan E (MRN [id number]) as of 07/01/2016 11:12  Ref. Range 06/30/2016 07:39 06/30/2016 11:56 06/30/2016 16:44 06/30/2016 21:10 07/01/2016 07:23  Glucose-Capillary Latest Ref Range: 65 - 99 mg/dL 409118 (H) 811131 (H) 914208 (H) 233 (H) 143 (H)   Review of Glycemic Control  Current orders for Inpatient glycemic control: Novolog 0-9 units TID with meals, Novolog 0-5 units QHS  Inpatient Diabetes Program Recommendations: Correction (SSI): Please consider increasing Novolog correction to Moderate scale.  Thanks, Orlando PennerMarie Iliza Blankenbeckler, RN, MSN, CDE Diabetes Coordinator Inpatient Diabetes Program [phone] (Team Pager from 8am to 5pm) [phone] ([facility] office) [phone] [facility] office) [phone] [facility] office)

## 2016-07-01 NOTE — Discharge Summary (Signed)
[facility] - [place] at [facility]   PATIENT NAME: Dana Riddle    MR#:  [id number]  DATE OF BIRTH:  [birth year]  DATE OF ADMISSION:  06/26/2016 ADMITTING PHYSICIAN: Tonye Royalty, DO  DATE OF DISCHARGE: 07/01/2016  PRIMARY CARE PHYSICIAN: Vonita Moss, MD    ADMISSION DIAGNOSIS:  Hyperkalemia [E87.5] UTI (lower urinary tract infection) [N39.0] Acute renal failure, unspecified acute renal failure type (HCC) [N17.9] Altered mental status, unspecified altered mental status type [R41.82]  DISCHARGE DIAGNOSIS:  Principal Problem:   Alzheimer's dementia Active Problems:   Acute renal failure (ARF) (HCC)   Palliative care encounter   Goals of care, counseling/discussion   Altered mental status   Adult failure to thrive   DNR (do not resuscitate) discussion   SECONDARY DIAGNOSIS:   Past Medical History:  Diagnosis Date  . Alzheimer's dementia   . Chronic kidney disease   . Diabetes mellitus without complication (HCC)   . Hyperlipidemia   . Hypertension     HOSPITAL COURSE:  82 y.o. white female with Severe Alzheimer's dementia, diabetes mellitus, hypertension, hyperlipidemia, who was admitted to [facility] on 9/29/2017for evaluation of altered mental status.  1. Delirium due to Enterococcus UTI and underlying dementia. Her dementia is progressing. She was evaluated by PSYCHIATRY who had recommended use of HALDOL PRN.  2. Acute on chronic renal injury due to postobstruction with bilateral hydronephrosis: Creatinine improved with placement of Foley and IVF. Nephrology team was consulted. Ultrasound and CT san shows bilateral hydronephrosis with kidney stones but no impaction. She is not a candidate for any intervention given her severe dementia   3. Elevation in troponin was from demand ischemia. ECHO shows Ef 75% and no WMA.  4. Enterococcus UTI treated with AMPICILLIN.  She will be discharged with HOSPICE due to failure to thrive, advanced dementia  and overall very poor prognosis   DISCHARGE CONDITIONS AND DIET:  Guarded condition Diet as toelrated  CONSULTS OBTAINED:  Treatment Team:  Wyatt Haste, MD Munsoor Cherylann Ratel, MD Audery Amel, MD  DRUG ALLERGIES:  No Known Allergies  DISCHARGE MEDICATIONS:   Current Discharge Medication List    START taking these medications   Details  dronabinol (MARINOL) 2.5 MG capsule Take 1 capsule (2.5 mg total) by mouth 2 (two) times daily before lunch and supper. Qty: 20 capsule, Refills: 0    oxyCODONE (OXY IR/ROXICODONE) 5 MG immediate release tablet Take 1 tablet (5 mg total) by mouth every 4 (four) hours as needed for moderate pain. Qty: 30 tablet, Refills: 0      CONTINUE these medications which have NOT CHANGED   Details  acetaminophen (TYLENOL) 500 MG tablet Take 500 mg by mouth every 6 (six) hours as needed for fever or headache.    feeding supplement, ENSURE ENLIVE, (ENSURE ENLIVE) LIQD Take 237 mLs by mouth 2 (two) times daily between meals. Qty: 237 mL, Refills: 12    insulin aspart (NOVOLOG) 100 UNIT/ML injection Inject into the skin. Sliding scale 200-249 4 units 250-299 6 units 300-349 8 units 350-399 10 units    mirtazapine (REMERON) 7.5 MG tablet Take 15 mg by mouth at bedtime.     senna (SENOKOT) 8.6 MG TABS tablet Take 1 tablet by mouth 3 (three) times a week. Monday Wednesday Friday    sertraline (ZOLOFT) 50 MG tablet Take 50 mg by mouth daily.      STOP taking these medications     alendronate (FOSAMAX) 35 MG tablet      aspirin  EC 81 MG tablet      calcium carbonate (TUMS - DOSED IN MG ELEMENTAL CALCIUM) 500 MG chewable tablet      ferrous sulfate 325 (65 FE) MG tablet      sitaGLIPtin (JANUVIA) 25 MG tablet               Today   CHIEF COMPLAINT:  Patient remains lethargic   VITAL SIGNS:  Blood pressure (!) 142/58, pulse 79, temperature 98.2 F (36.8 C), temperature source Oral, resp. rate 17, height 5\' 3"  (1.6 m), weight 40.4 kg  (89 lb 1.6 oz), SpO2 99 %.   REVIEW OF SYSTEMS:  Review of Systems  Unable to perform ROS: Dementia     PHYSICAL EXAMINATION:  GENERAL:  82 y.o.-year-old patient lying in the bed with no acute distress. Opens eys but does not speak NECK:  Supple, no jugular venous distention. No thyroid enlargement, no tenderness.  LUNGS: decreased  breath sounds bilaterally, no wheezing, rales,rhonchi  No use of accessory muscles of respiration.  CARDIOVASCULAR: S1, S2 normal. No murmurs, rubs, or gallops.  ABDOMEN: Soft, non-tender, non-distended. Bowel sounds present. No organomegaly or mass.  EXTREMITIES: No pedal edema, cyanosis, or clubbing.  PSYCHIATRIC: The patient is lethargic SKIN: No obvious rash, lesion, or ulcer.  Foley cath in placed with red urine from trauma  DATA REVIEW:   CBC  Recent Labs Lab 07/01/16 0451  WBC 10.3  HGB 8.6*  HCT 25.8*  PLT 393    Chemistries   Recent Labs Lab 06/26/16 1730  07/01/16 0451  NA 128*  < > 133*  K 5.5*  < > 4.3  CL 99*  < > 108  CO2 19*  < > 21*  GLUCOSE 163*  < > 135*  BUN 39*  < > 21*  CREATININE 2.69*  < > 1.43*  CALCIUM 8.2*  < > 7.8*  AST 16  --   --   ALT 7*  --   --   ALKPHOS 93  --   --   BILITOT 0.4  --   --   < > = values in this interval not displayed.  Cardiac Enzymes  Recent Labs Lab 06/26/16 1730 06/27/16 0431  TROPONINI 0.04* 0.03*    Microbiology Results  @MICRORSLT48 @  RADIOLOGY:  No results found.    Stable for discharge   Patient should follow up with hospice  CODE STATUS:     Code Status Orders        Start     Ordered   06/26/16 2012  Full code  Continuous     06/26/16 2012    Code Status History    Date Active Date Inactive Code Status Order ID Comments User Context   06/26/2016  8:12 PM 06/27/2016  1:37 PM Full Code [id number]  Wyatt Hasteavid K Hower, MD ED   08/13/2015  6:36 PM 08/16/2015 12:48 PM Full Code [id number]  Kennedy BuckerMichael Menz, MD Inpatient   08/11/2015  4:04 PM 08/13/2015  6:36  PM Full Code [id number]  Shaune PollackQing Chen, MD Inpatient   07/10/2015 10:13 PM 07/13/2015  4:49 PM Full Code [id number]  Donato HeinzJames P Hooten, MD Inpatient   07/09/2015  8:33 PM 07/10/2015 10:13 PM Full Code [id number]  Donato HeinzJames P Hooten, MD Inpatient   07/09/2015  4:07 PM 07/09/2015  8:33 PM Full Code [id number]  Alford Highlandichard Wieting, MD ED      TOTAL TIME TAKING CARE OF THIS PATIENT: 35 minutes.    Note: This dictation was prepared  with Dragon dictation along with smaller phrase technology. Any transcriptional errors that result from this process are unintentional.  Doran Nestle M.D on 07/01/2016 at 11:32 AM  Between 7am to 6pm - Pager - [phone] After 6pm go to [email or web address] - Social research officer, government  [facility] [place] [facility]  Office  [phone]  CC: Primary care physician; Vonita Moss, MD

## 2016-07-01 NOTE — Progress Notes (Signed)
DNR documentation [facility]. Durable DNR completed and placed in chart.  NO CHARGE  Vennie HomansMegan Queen Abbett, FNP-C Palliative Medicine Team  Phone: [phone] Fax: [phone]

## 2016-07-01 NOTE — Care Management Important Message (Signed)
Important Message  Patient Details  Name: Dana Riddle MRN: [id number] Date of Birth: [birth year]   Medicare Important Message Given:  Yes    Chapman FitchBOWEN, Precious Gilchrest T, RN 07/01/2016, 11:15 AM

## 2016-07-01 NOTE — Progress Notes (Signed)
New referral for [facility] at [facility] following discharge received from CSW Monica Marra. Ms. Moroney is an 82 year old woman admitted from [facility] on 9/29 for assessment of altered mental status and incerased lethargy. She has been treated for a urinary tract infection, hyperkalemia and has been found to have kidney stones.She has had a foley catheter placed and per discussion with staff RN Emily this will remain in place at discharge. Patient is bedbound and dependent for all ADL's. She has a DSS guardian that has met with the Palliative Medicine team and the plan is for discharge back to [facility] with hospice care. Patient is now a DNR, with out of facility DNR in place. °Patient seen lying in bed, awake and alert, will answer yes or no questions, not always appropriately. She continues with a  poor appetite, even when fed, needs medications crushed in applesauce. She has not required any pain medication during her hospitalization. Plan is for discharge via EMS today. Per discussion with CSW Monica patient already has a hospital bed in place, no other DME needs at this time. Paient information faxed to hospice referral. Thank you. °Karen Robertson RN, BSN, CHPN °[facility], hospital Liaison °[phone] c °

## 2016-07-01 NOTE — Clinical Social Work Note (Signed)
CSW has coordinated discharge today back [facility] [place]. CSW spoke with Joyce GrossKay at DSS who approved for [facility] to [facility] and preferred [facility] [place]. CSW spoke with both Renea Eevelyn and Ardyth GalQueen Himes, owners of [facility] and they are in agreement with receiving patient and know that she is total care.Patient to transport via EMS. York SpanielMonica Traeh Milroy MSW,LCSW [phone]

## 2016-07-01 NOTE — Progress Notes (Signed)
[facility]  ROUNDING NOTE   Subjective:   Creatinine 1.43 (1.64) (2)  UOP 3250 (2625)  Objective:  Vital signs in last 24 hours:  Temp:  [97.5 F (36.4 C)-98.2 F (36.8 C)] 98.2 F (36.8 C) (10/04 0816) Pulse Rate:  [69-82] 79 (10/04 0816) Resp:  [16-18] 17 (10/04 0816) BP: (110-156)/(48-73) 142/58 (10/04 0816) SpO2:  [98 %-100 %] 99 % (10/04 0816)  Weight change:  Filed Weights   06/26/16 1731 06/27/16 0028  Weight: 40.9 kg (90 lb 2.7 oz) 40.4 kg (89 lb 1.6 oz)    Intake/Output: I/O last 3 completed shifts: In: 2275 [P.O.:297; I.V.:1928; IV Piggyback:50] Out: 4800 [Urine:4800]   Intake/Output this shift:  Total I/O In: 360 [P.O.:360] Out: 600 [Urine:600]  Physical Exam: General: NAD, laying in bed  Head: Normocephalic, atraumatic. Moist oral mucosal membranes  Eyes: Anicteric, PERRL  Neck: Supple, trachea midline  Lungs:  Clear to auscultation  Heart: Regular rate and rhythm  Abdomen:  Soft, nontender,   Extremities:  no peripheral edema.  Neurologic: Not following commands  Skin: No lesions  GU: Foley with urine    Basic Metabolic Panel:  Recent Labs Lab 06/26/16 1730 06/27/16 0431 06/28/16 0540 06/29/16 0615 06/30/16 0430 07/01/16 0451  NA 128* 134* 134* 132* 134* 133*  K 5.5* 4.5 4.6 4.5  --  4.3  CL 99* 108 110 110  --  108  CO2 19* 21* 19* 17*  --  21*  GLUCOSE 163* 104* 111* 80  --  135*  BUN 39* 35* 31* 25*  --  21*  CREATININE 2.69* 2.36* 2.20* 2.00* 1.64* 1.43*  CALCIUM 8.2* 8.0* 7.6* 7.5*  --  7.8*    Liver Function Tests:  Recent Labs Lab 06/26/16 1730  AST 16  ALT 7*  ALKPHOS 93  BILITOT 0.4  PROT 7.1  ALBUMIN 2.5*   No results for input(s): LIPASE, AMYLASE in the last 168 hours. No results for input(s): AMMONIA in the last 168 hours.  CBC:  Recent Labs Lab 06/26/16 1730 06/27/16 0431 06/28/16 0540 06/29/16 0615 06/30/16 0430 07/01/16 0451  WBC 14.4* 12.1* 10.5  --   --  10.3  NEUTROABS 10.9*   --   --   --   --   --   HGB 9.6* 9.2* 8.1* 7.5* 8.1* 8.6*  HCT 29.6* 27.9* 24.6*  --   --  25.8*  MCV 90.2 90.4 89.4  --   --  89.4  PLT 504* 413 365  --   --  393    Cardiac Enzymes:  Recent Labs Lab 06/26/16 1730 06/27/16 0431  TROPONINI 0.04* 0.03*    BNP: Invalid input(s): POCBNP  CBG:  Recent Labs Lab 06/30/16 0739 06/30/16 1156 06/30/16 1644 06/30/16 2110 07/01/16 0723  GLUCAP 118* 131* 208* 233* 143*    Microbiology: Results for orders placed or performed during the hospital encounter of 06/26/16  Urine culture     Status: Abnormal   Collection Time: 06/26/16  5:30 PM  Result Value Ref Range Status   Specimen Description URINE, CATHETERIZED  Final   Special Requests NONE  Final   Culture >=100,000 COLONIES/mL ENTEROCOCCUS FAECALIS (A)  Final   Report Status 06/29/2016 FINAL  Final   Organism ID, Bacteria ENTEROCOCCUS FAECALIS (A)  Final      Susceptibility   Enterococcus faecalis - MIC*    AMPICILLIN <=2 SENSITIVE Sensitive     LEVOFLOXACIN >=8 RESISTANT Resistant     NITROFURANTOIN <=16 SENSITIVE Sensitive  VANCOMYCIN 1 SENSITIVE Sensitive     * >=100,000 COLONIES/mL ENTEROCOCCUS FAECALIS  MRSA PCR Screening     Status: Abnormal   Collection Time: 06/27/16  1:07 AM  Result Value Ref Range Status   MRSA by PCR POSITIVE (A) NEGATIVE Final    Comment:        The GeneXpert MRSA Assay (FDA approved for NASAL specimens only), is one component of a comprehensive MRSA colonization surveillance program. It is not intended to diagnose MRSA infection nor to guide or monitor treatment for MRSA infections. RESULT CALLED TO, READ BACK BY AND VERIFIED WITH: STACY CLAY AT 0256 06/27/16.PMH   Urine culture     Status: Abnormal   Collection Time: 06/29/16 10:41 AM  Result Value Ref Range Status   Specimen Description URINE, RANDOM  Final   Special Requests NONE  Final   Culture >=100,000 COLONIES/mL ENTEROCOCCUS FAECALIS (A)  Final   Report Status  07/01/2016 FINAL  Final   Organism ID, Bacteria ENTEROCOCCUS FAECALIS (A)  Final      Susceptibility   Enterococcus faecalis - MIC*    AMPICILLIN <=2 SENSITIVE Sensitive     LEVOFLOXACIN >=8 RESISTANT Resistant     NITROFURANTOIN <=16 SENSITIVE Sensitive     VANCOMYCIN 1 SENSITIVE Sensitive     * >=100,000 COLONIES/mL ENTEROCOCCUS FAECALIS    Coagulation Studies: No results for input(s): LABPROT, INR in the last 72 hours.  Urinalysis:  Recent Labs  06/29/16 1041  COLORURINE RED*  LABSPEC 1.010  PHURINE 6.0  GLUCOSEU 50*  HGBUR 3+*  BILIRUBINUR NEGATIVE  KETONESUR TRACE*  PROTEINUR 100*  NITRITE NEGATIVE  LEUKOCYTESUR 2+*      Imaging: No results found.   Medications:   . sodium chloride 50 mL/hr at 06/30/16 2135   . ampicillin (OMNIPEN) IV  1 g Intravenous Q8H  . aspirin EC  81 mg Oral Daily  . calcium carbonate  1 tablet Oral BID  . dronabinol  2.5 mg Oral BID AC  . feeding supplement (ENSURE ENLIVE)  237 mL Oral BID BM  . ferrous sulfate  325 mg Oral BID WC  . heparin  5,000 Units Subcutaneous Q8H  . insulin aspart  0-5 Units Subcutaneous QHS  . insulin aspart  0-9 Units Subcutaneous TID WC  . mirtazapine  15 mg Oral QHS  . mupirocin ointment  1 application Nasal BID  . pantoprazole  40 mg Oral Daily  . senna  1 tablet Oral Once per day on Mon Wed Fri  . sertraline  50 mg Oral Daily  . sodium chloride flush  3 mL Intravenous Q12H   acetaminophen **OR** acetaminophen, haloperidol lactate, ondansetron **OR** ondansetron (ZOFRAN) IV, oxyCODONE  Assessment/ Plan:  [name] is a 82 y.o. white female with Severe Alzheimer's dementia, diabetes mellitus, hypertension, hyperlipidemia, who was admitted to [facility] on 06/26/2016 for evaluation of altered mental status.   1.  Acute renal failure with metabolic acidosis: secondary to obstructive uropathy with bilateral hydronephrosis. Foley catheter placed. Urine output now nonoliguric. No history of chronic  kidney disease - IV NS at 50  2. Urinary Tract Infection: 9/29: urine culture with E. Faecalis - ampicillin  3. Hyponatremia: secondary to renal failure. - continue to monitor.   4. Anemia with renal failure: low threshold for epo.     LOS: 5 Dana Riddle 10/4/201710:57 AM

## 2016-07-01 NOTE — Progress Notes (Signed)
Nutrition Follow-up  DOCUMENTATION CODES:   Severe malnutrition in context of chronic illness  INTERVENTION:  -Ensure Enlive po BID, each supplement provides 350 kcal and 20 grams of protein  NUTRITION DIAGNOSIS:   Malnutrition related to chronic illness as evidenced by severe depletion of muscle mass, severe depletion of body fat. -ongoing  GOAL:   Patient will meet greater than or equal to 90% of their needs -not meeting  MONITOR:   PO intake, I & O's, Supplement acceptance, Labs, Weight trends  REASON FOR ASSESSMENT:   Malnutrition Screening Tool    ASSESSMENT:   Dana Riddle  is a 82 y.o. female with a known history of dementia, type 2 diabetes insulin requiring who is presenting with altered mental status from her nursing facility. Patient is lethargic so to Hospital further workup and evaluation  Pt continues with poor PO Avg documented PO 39% over last 6 meals. No new wts since 9/30 To d/c home on hospice today Provide comfort feeds as desired.  Diet Order:  Diet heart healthy/carb modified Room service appropriate? Yes; Fluid consistency: Thin  Skin:  Reviewed, no issues  Last BM:  PTA  Height:   Ht Readings from Last 1 Encounters:  06/26/16 5\' 3"  (1.6 m)    Weight:   Wt Readings from Last 1 Encounters:  06/27/16 89 lb 1.6 oz (40.4 kg)    Ideal Body Weight:  52.27 kg  BMI:  Body mass index is 15.78 kg/m.  Estimated Nutritional Needs:   Kcal:  1200-1415 calories  Protein:  40-50 gm  Fluid:  >/= 1.2L  EDUCATION NEEDS:   No education needs identified at this time  Dionne AnoWilliam M. Jayro Mcmath, MS, RD LDN Inpatient Clinical Dietitian Pager [phone]

## 2016-07-02 ENCOUNTER — Telehealth: Payer: Self-pay | Admitting: Family Medicine

## 2016-07-02 NOTE — Telephone Encounter (Signed)
Paperwork faxed °

## 2016-10-29 DEATH — deceased

## 2017-05-25 ENCOUNTER — Telehealth: Payer: Self-pay | Admitting: Family Medicine

## 2017-05-25 NOTE — Telephone Encounter (Signed)
Called pt to schedule for Annual Wellness Visit with Nurse Health Advisor, Tiffany Hill, my c/b # is [phone]  Kathryn Brown ° °

## 2017-12-05 IMAGING — CR DG CHEST 2V
1 series · 2 of 2 positions shown · non-contrast
Comparison: Chest x-rays dated 08/11/2015 and 07/11/2015.

CLINICAL DATA: Lethargy and hematuria for 1 week, altered mental
status. Left lower field rales.

EXAM:
CHEST  2 VIEW

[Series 1: dg chest 2 view · 0.14mm/px · 2 of 2 slices shown]
[im 1/2]
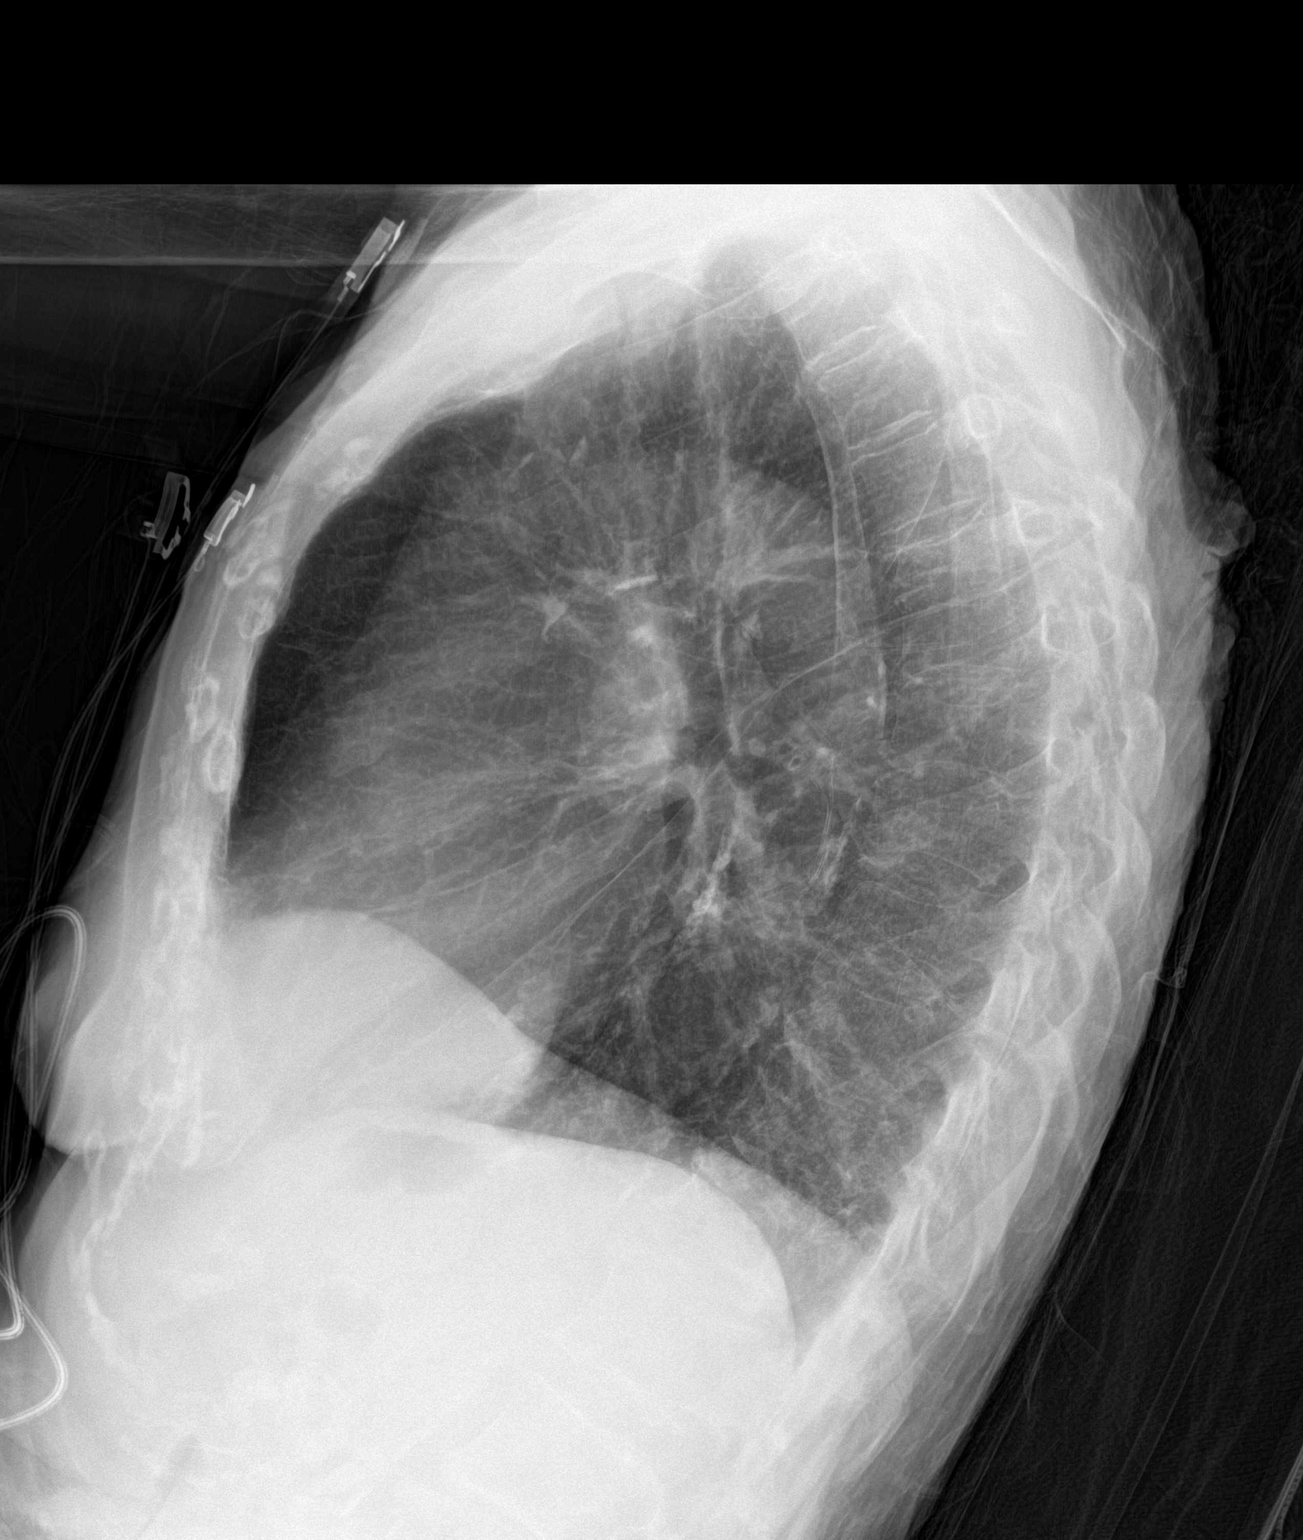
[im 2/2]
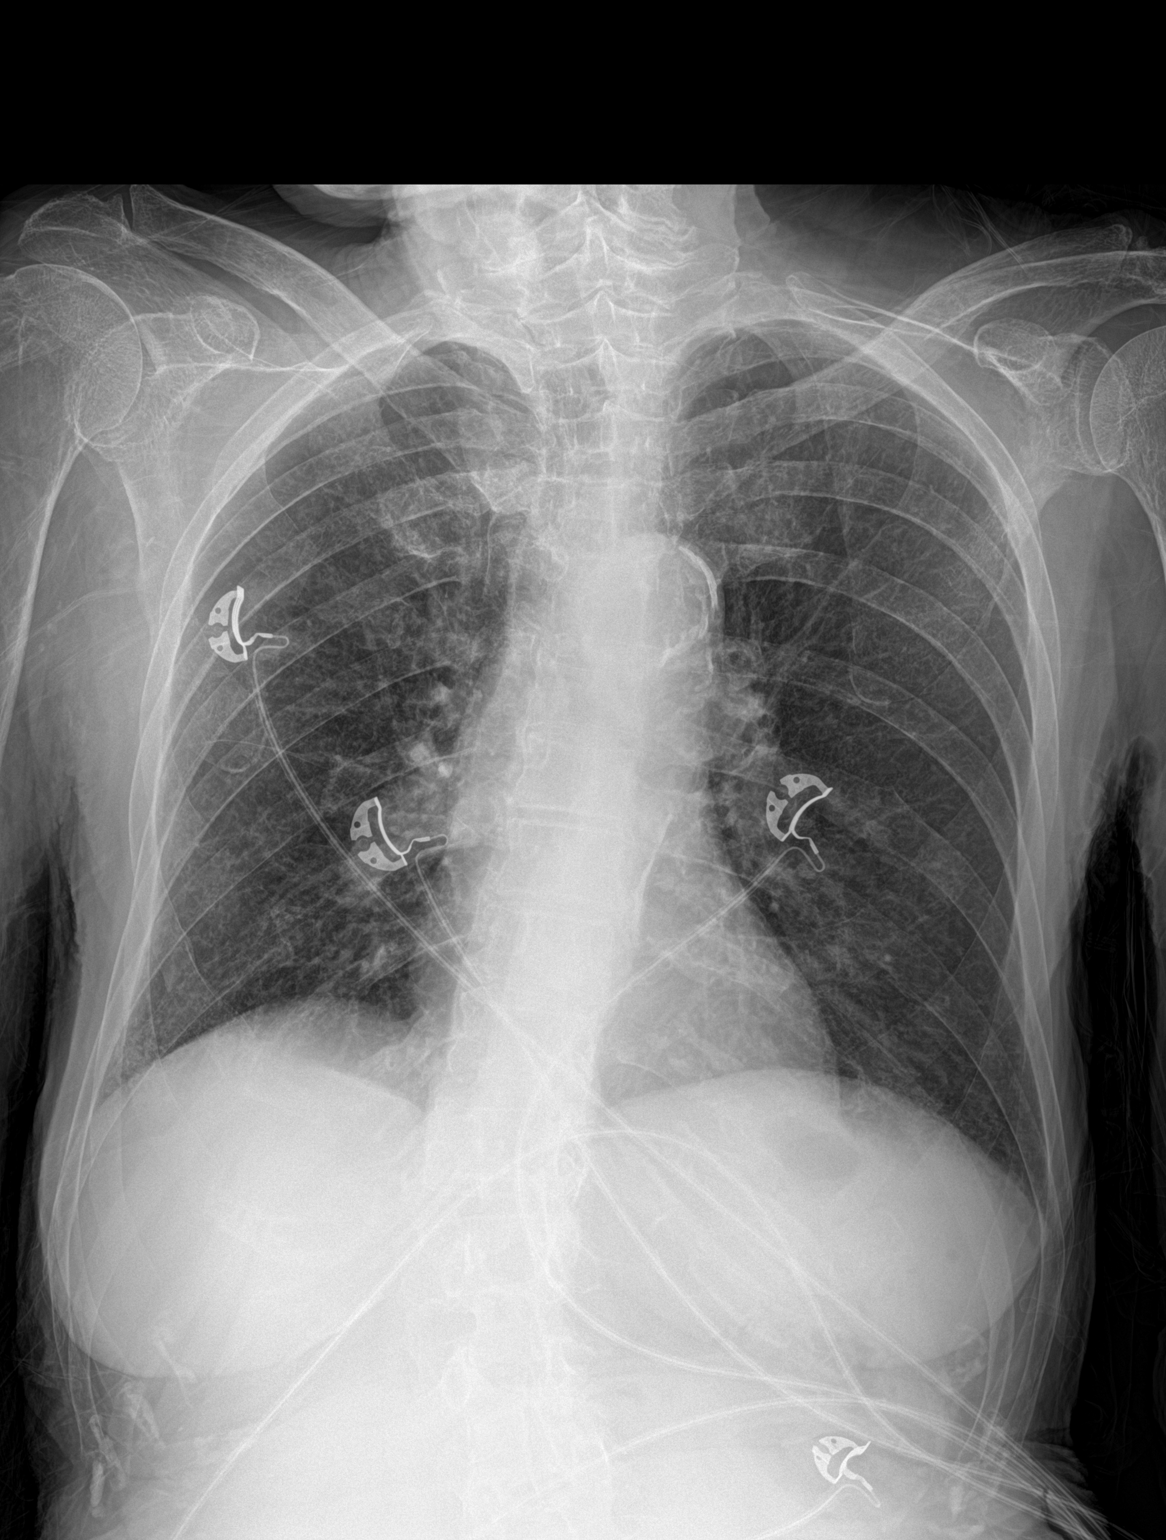

[2 of 2 positions shown; findings below may reference images not displayed]

FINDINGS: Heart size and mediastinal contours are normal. Atherosclerotic
changes again noted at the aortic arch.

Mildly prominent interstitial lung markings again noted suggesting
chronic interstitial lung disease. No new lung findings. No evidence
of pneumonia. No pleural effusion no pneumothorax seen.

Mild degenerative spurring again noted within the slightly kyphotic
thoracic spine. Probable mild compression fracture deformities noted
in the upper and mid thoracic spine, difficult to characterize due
to the patient's overall osteopenia, likely chronic given the stable
kyphosis.
IMPRESSION: 1. No acute findings.  No evidence of pneumonia.
2. Suspected chronic interstitial lung disease.
3. Aortic atherosclerosis.

## 2017-12-05 IMAGING — CT CT HEAD W/O CM
3 series · 15 of 46 positions shown, 18 images · non-contrast
Comparison: CT scan of July 09, 2015.

CLINICAL DATA: Altered mental status.

EXAM:
CT HEAD WITHOUT CONTRAST
TECHNIQUE: Contiguous axial images were obtained from the base of the skull
through the vertex without intravenous contrast.

[Series 2: head wo · axial · 0.40mm/px · z∈[-78,+42]mm · 9 of 29 slices shown, 12 images]
[im 3/29  brain]
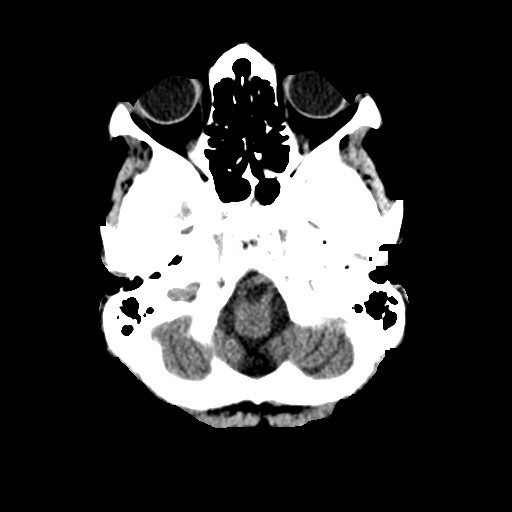
[im 3/29  bone]
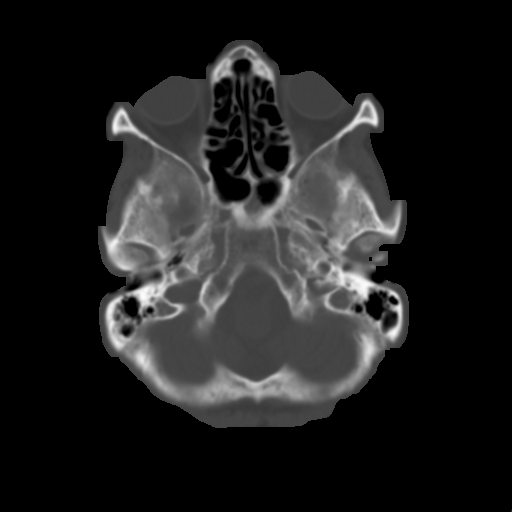
[im 6/29  brain]
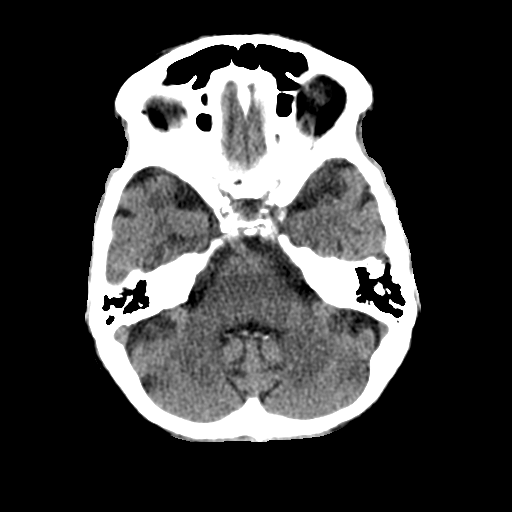
[im 9/29  brain]
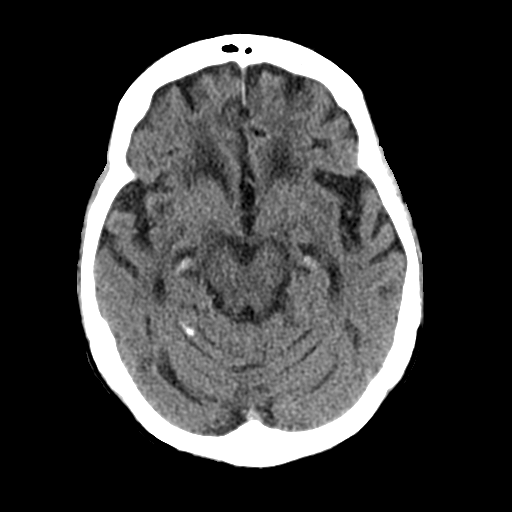
[im 12/29  brain]
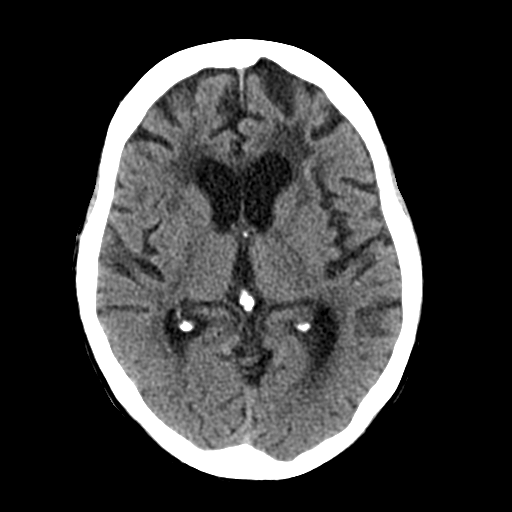
[im 15/29  brain]
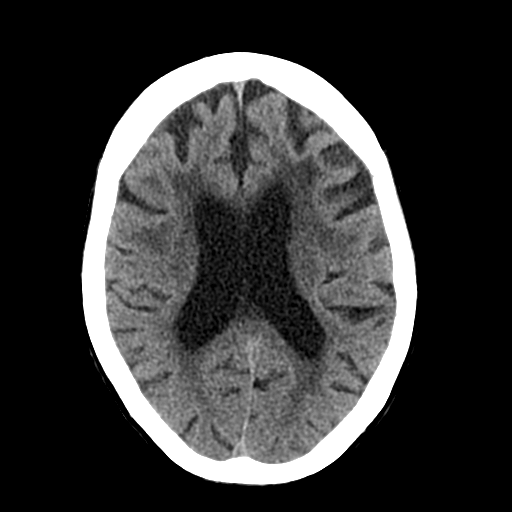
[im 15/29  bone]
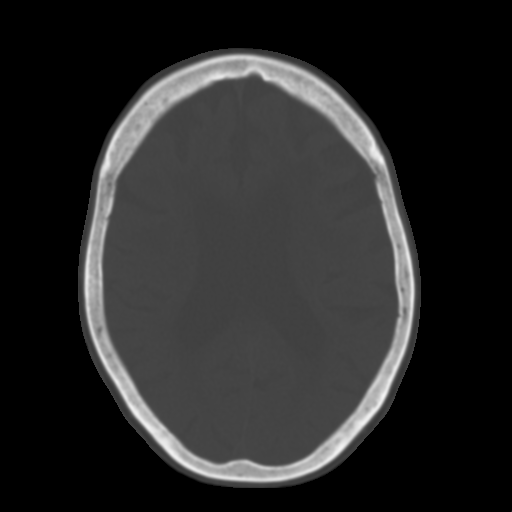
[im 18/29  brain]
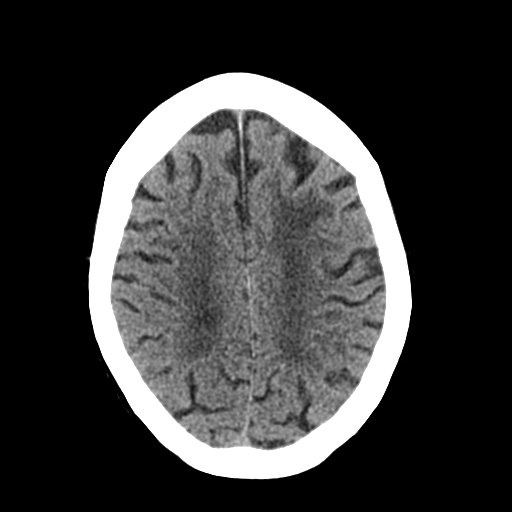
[im 21/29  brain]
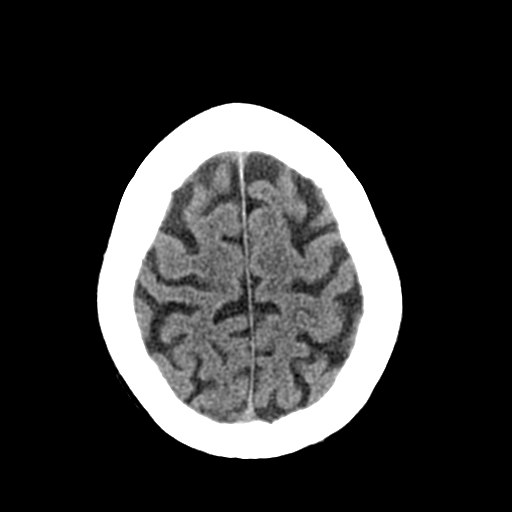
[im 24/29  brain]
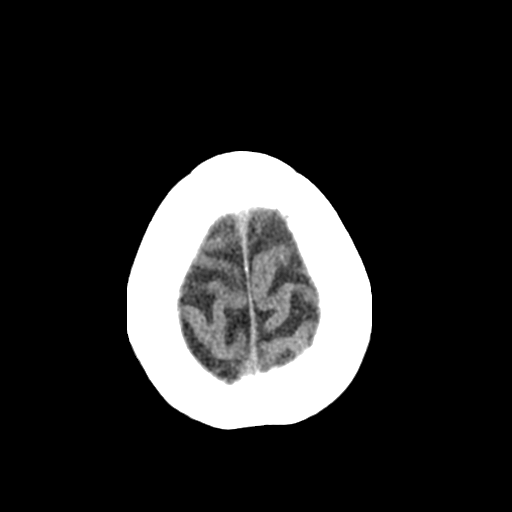
[im 27/29  brain]
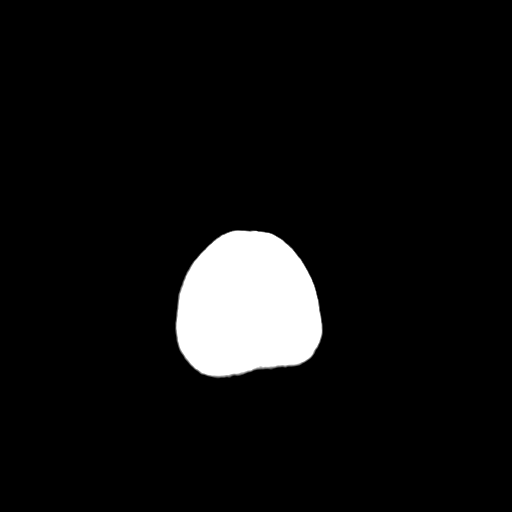
[im 27/29  bone]
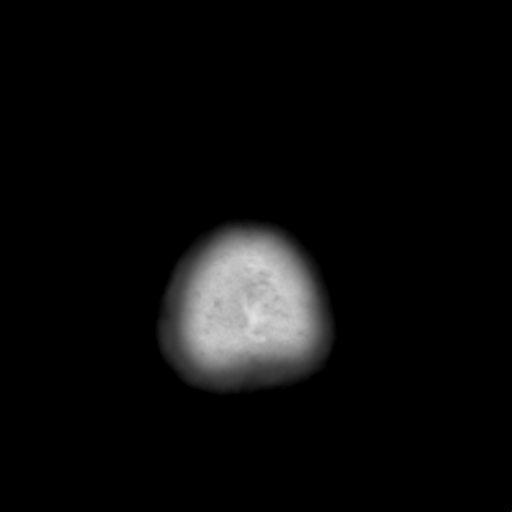

[Series 4: coronal soft tissue · coronal · 0.27mm/px · 3 of 60 slices shown]
[im 20/60  brain]
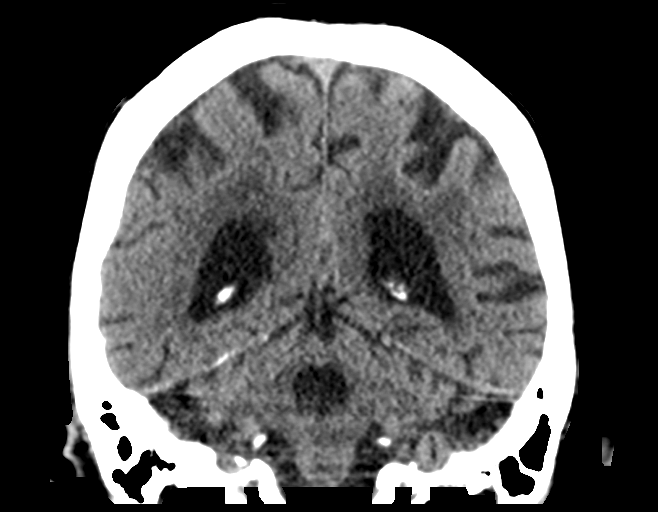
[im 27/60  brain]
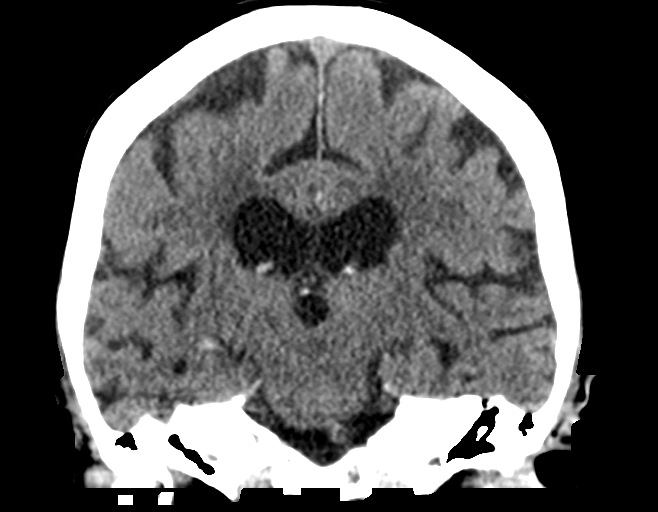
[im 33/60  brain]
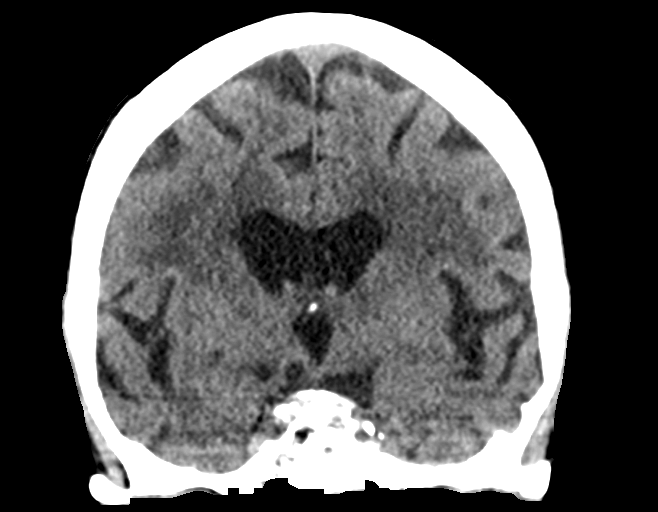

[Series 5: sagittal soft tissue · sagittal · 0.28mm/px · 3 of 47 slices shown]
[im 16/47  brain]
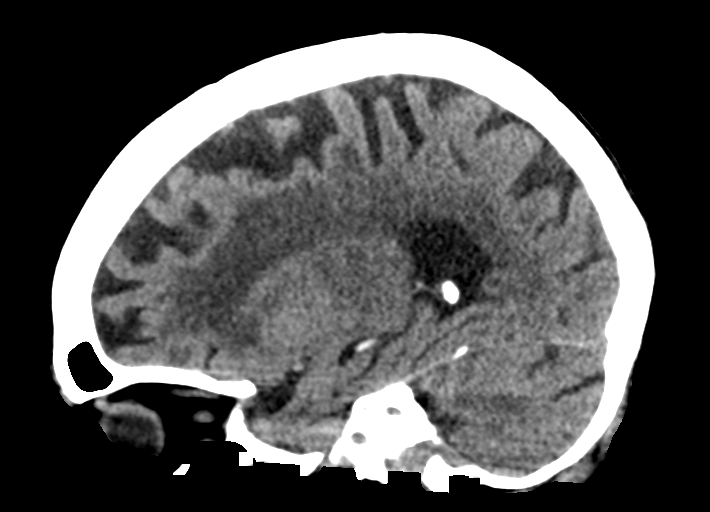
[im 24/47  brain]
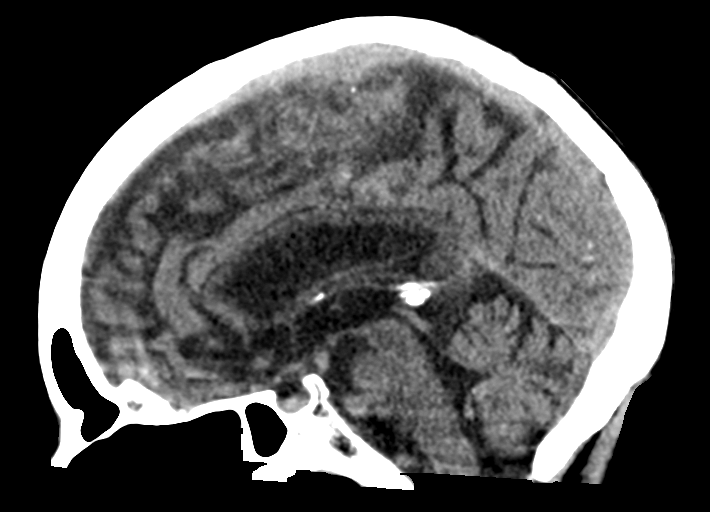
[im 31/47  brain]
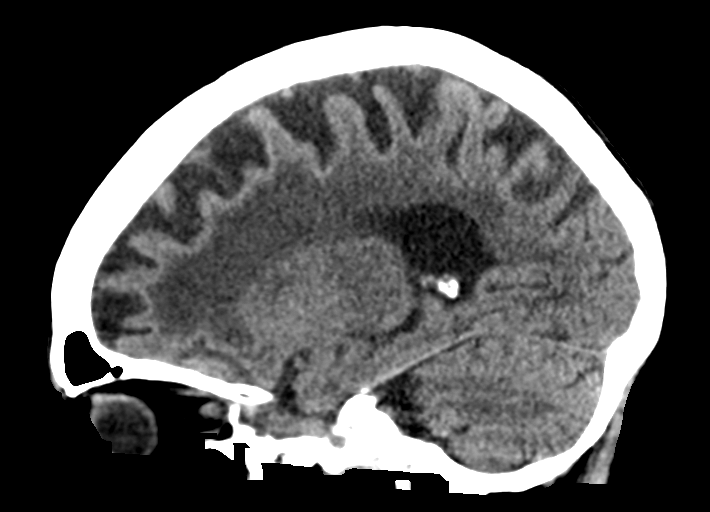

[15 of 46 positions shown; findings below may reference images not displayed]

FINDINGS: Brain: Mild diffuse cortical atrophy is noted. Mild chronic ischemic
white matter disease is noted. No mass effect or midline shift is
noted. Ventricular size is within normal limits. There is no
evidence of mass lesion, hemorrhage or acute infarction.

Vascular: Atherosclerotic calcifications of internal carotid
arteries is noted.

Skull: Bony calvarium appears intact.

Sinuses/Orbits: Mild ethmoid and left sphenoid sinusitis is noted.

Other: None.
IMPRESSION: Mild diffuse cortical atrophy. Mild chronic ischemic white matter
disease. No acute intracranial abnormality seen

## 2022-05-06 ENCOUNTER — Telehealth: Payer: Self-pay

## 2022-05-06 NOTE — Telephone Encounter (Signed)
error
